# Patient Record
Sex: Male | Born: 1950 | Race: Black or African American | Hispanic: No | Marital: Married | Smoking: Never smoker
Health system: Southern US, Community
[De-identification: ages and names within clinical notes are randomized; demographics above are authoritative.]

## PROBLEM LIST (undated history)

## (undated) ENCOUNTER — Emergency Department (HOSPITAL_COMMUNITY): Admission: EM | Payer: PPO | Source: Home / Self Care

## (undated) DIAGNOSIS — M25469 Effusion, unspecified knee: Secondary | ICD-10-CM

## (undated) DIAGNOSIS — M1990 Unspecified osteoarthritis, unspecified site: Secondary | ICD-10-CM

## (undated) DIAGNOSIS — I480 Paroxysmal atrial fibrillation: Secondary | ICD-10-CM

## (undated) DIAGNOSIS — I4891 Unspecified atrial fibrillation: Secondary | ICD-10-CM

## (undated) DIAGNOSIS — R112 Nausea with vomiting, unspecified: Secondary | ICD-10-CM

## (undated) DIAGNOSIS — Z9889 Other specified postprocedural states: Secondary | ICD-10-CM

## (undated) DIAGNOSIS — M549 Dorsalgia, unspecified: Secondary | ICD-10-CM

## (undated) DIAGNOSIS — N200 Calculus of kidney: Secondary | ICD-10-CM

## (undated) DIAGNOSIS — G4730 Sleep apnea, unspecified: Secondary | ICD-10-CM

## (undated) HISTORY — DX: Unspecified atrial fibrillation: I48.91

## (undated) HISTORY — PX: LITHOTRIPSY: SUR834

## (undated) HISTORY — DX: Paroxysmal atrial fibrillation: I48.0

## (undated) HISTORY — DX: Sleep apnea, unspecified: G47.30

## (undated) HISTORY — DX: Dorsalgia, unspecified: M54.9

## (undated) HISTORY — DX: Effusion, unspecified knee: M25.469

## (undated) HISTORY — PX: BACK SURGERY: SHX140

## (undated) SURGERY — Surgical Case
Anesthesia: *Unknown

---

## 2001-01-23 ENCOUNTER — Encounter: Payer: Self-pay | Admitting: Family Medicine

## 2001-01-23 ENCOUNTER — Ambulatory Visit (HOSPITAL_COMMUNITY): Admission: RE | Admit: 2001-01-23 | Discharge: 2001-01-23 | Payer: Self-pay | Admitting: Family Medicine

## 2001-01-30 ENCOUNTER — Encounter: Payer: Self-pay | Admitting: Family Medicine

## 2001-01-30 ENCOUNTER — Ambulatory Visit (HOSPITAL_COMMUNITY): Admission: RE | Admit: 2001-01-30 | Discharge: 2001-01-30 | Payer: Self-pay | Admitting: Family Medicine

## 2001-02-08 ENCOUNTER — Ambulatory Visit (HOSPITAL_COMMUNITY): Admission: RE | Admit: 2001-02-08 | Discharge: 2001-02-08 | Payer: Self-pay | Admitting: General Surgery

## 2001-07-26 ENCOUNTER — Encounter: Payer: Self-pay | Admitting: Family Medicine

## 2001-07-26 ENCOUNTER — Ambulatory Visit (HOSPITAL_COMMUNITY): Admission: RE | Admit: 2001-07-26 | Discharge: 2001-07-26 | Payer: Self-pay | Admitting: Family Medicine

## 2002-02-08 ENCOUNTER — Encounter: Payer: Self-pay | Admitting: Family Medicine

## 2002-02-08 ENCOUNTER — Ambulatory Visit (HOSPITAL_COMMUNITY): Admission: RE | Admit: 2002-02-08 | Discharge: 2002-02-08 | Payer: Self-pay | Admitting: Family Medicine

## 2002-02-28 ENCOUNTER — Encounter (HOSPITAL_COMMUNITY): Admission: RE | Admit: 2002-02-28 | Discharge: 2002-03-30 | Payer: Self-pay | Admitting: Neurosurgery

## 2003-03-27 ENCOUNTER — Other Ambulatory Visit: Admission: RE | Admit: 2003-03-27 | Discharge: 2003-03-27 | Payer: Self-pay | Admitting: Family Medicine

## 2003-10-07 ENCOUNTER — Ambulatory Visit (HOSPITAL_COMMUNITY): Admission: RE | Admit: 2003-10-07 | Discharge: 2003-10-07 | Payer: Self-pay | Admitting: Family Medicine

## 2007-10-26 ENCOUNTER — Emergency Department (HOSPITAL_COMMUNITY): Admission: EM | Admit: 2007-10-26 | Discharge: 2007-10-26 | Payer: Self-pay | Admitting: Emergency Medicine

## 2011-03-18 ENCOUNTER — Ambulatory Visit (HOSPITAL_COMMUNITY): Admission: RE | Admit: 2011-03-18 | Payer: Self-pay | Source: Ambulatory Visit | Admitting: Neurosurgery

## 2011-04-12 LAB — POCT I-STAT, CHEM 8
BUN: 12
Calcium, Ion: 1.2
Creatinine, Ser: 1.2
Glucose, Bld: 111 — ABNORMAL HIGH
Hemoglobin: 15.6
Sodium: 141
TCO2: 25

## 2011-04-12 LAB — CBC
HCT: 43.9
Hemoglobin: 15.2
MCHC: 34.6
MCV: 93.2
Platelets: 258
RBC: 4.71
RDW: 14.1
WBC: 7.8

## 2011-04-12 LAB — DIFFERENTIAL
Eosinophils Absolute: 0.1
Eosinophils Relative: 1
Lymphocytes Relative: 28
Lymphs Abs: 2.2
Monocytes Absolute: 0.6
Monocytes Relative: 8

## 2012-02-27 ENCOUNTER — Encounter (HOSPITAL_COMMUNITY): Payer: Self-pay

## 2012-02-27 ENCOUNTER — Emergency Department (HOSPITAL_COMMUNITY)
Admission: EM | Admit: 2012-02-27 | Discharge: 2012-02-27 | Disposition: A | Discharge status: Home / Self Care | Payer: 59 | Source: Home / Self Care | Attending: Emergency Medicine | Admitting: Emergency Medicine

## 2012-02-27 DIAGNOSIS — R319 Hematuria, unspecified: Secondary | ICD-10-CM

## 2012-02-27 LAB — POCT URINALYSIS DIP (DEVICE)
Leukocytes, UA: NEGATIVE
Nitrite: NEGATIVE
Protein, ur: 30 mg/dL — AB
Urobilinogen, UA: 1 mg/dL (ref 0.0–1.0)
pH: 6.5 (ref 5.0–8.0)

## 2012-02-27 LAB — POCT I-STAT, CHEM 8
BUN: 11 mg/dL (ref 6–23)
Creatinine, Ser: 1.1 mg/dL (ref 0.50–1.35)
Hemoglobin: 14.6 g/dL (ref 13.0–17.0)
Potassium: 4 mEq/L (ref 3.5–5.1)
Sodium: 142 mEq/L (ref 135–145)
TCO2: 26 mmol/L (ref 0–100)

## 2012-02-27 LAB — OCCULT BLOOD, POC DEVICE: Fecal Occult Bld: NEGATIVE

## 2012-02-27 MED ORDER — CIPROFLOXACIN HCL 500 MG PO TABS: 500.0000 mg | ORAL_TABLET | Freq: Two times a day (BID) | ORAL | Status: AC

## 2012-02-27 NOTE — ED Provider Notes (Signed)
Chief Complaint  Patient presents with  . Male GU Problem    History of Present Illness:   [name] is a 61 year old male who had a three-day history of brownish discoloration of his urine. He denies any pain in in the back, lower abdomen, dysuria, frequency, urgency, or urethral discharge. He's had no prior history of hematuria. He denies any history of kidney stones or kidney infections. He denies any history of prostate infections or prostate problems. He has had a PSA done which was normal.   Review of Systems:  Other than noted above, the patient denies any of the following symptoms: General:  No fevers, chills, sweats, aches, or fatigue. GI:  No abdominal pain, back pain, nausea, vomiting, diarrhea, or constipation. GU:  No dysuria, frequency, urgency, hematuria, urethral discharge, penile lesions, penile pain, testicular pain, swelling, or mass, inguinal lymphadenopathy or incontinence.  PMFSH:  Past medical history, family history, social history, meds, and allergies were reviewed.  Physical Exam:   Vital signs:  BP 143/81  Pulse 66  Temp 98.3 F (36.8 C) (Oral)  Resp 18  SpO2 94% Gen:  Alert, oriented, in no distress. Lungs:  Clear to auscultation, no wheezes, rales or rhonchi. Heart:  Regular rhythm, no gallop or murmer. Abdomen:  Flat and soft.  No tenderness to palpation, guarding, or rebound.  No hepato-splenomegaly or mass.  Bowel sounds were normally active.  No hernia. Genital exam:   No lesions on the penis, no urethral discharge, testes were normal and nontender without any masses. Rectal exam:   Digital rectal exam reveals no masses, normal prostate, no tenderness, and heme-negative stool.  Back:  No CVA tenderness.  Skin:  Clear, warm and dry.  Labs:   Results for orders placed during the hospital encounter of 02/27/12  POCT URINALYSIS DIP (DEVICE)      Component Value Range   Glucose, UA NEGATIVE  NEGATIVE mg/dL   Bilirubin Urine NEGATIVE  NEGATIVE   Ketones, ur NEGATIVE  NEGATIVE mg/dL   Specific Gravity, Urine 1.025  1.005 - 1.030   Hgb urine dipstick LARGE (*) NEGATIVE   pH 6.5  5.0 - 8.0   Protein, ur 30 (*) NEGATIVE mg/dL   Urobilinogen, UA 1.0  0.0 - 1.0 mg/dL   Nitrite NEGATIVE  NEGATIVE   Leukocytes, UA NEGATIVE  NEGATIVE  POCT I-STAT, CHEM 8      Component Value Range   Sodium 142  135 - 145 mEq/L   Potassium 4.0  3.5 - 5.1 mEq/L   Chloride 105  96 - 112 mEq/L   BUN 11  6 - 23 mg/dL   Creatinine, Ser 1.61  0.50 - 1.35 mg/dL   Glucose, Bld 91  70 - 99 mg/dL   Calcium, Ion 0.96  0.45 - 1.30 mmol/L   TCO2 26  0 - 100 mmol/L   Hemoglobin 14.6  13.0 - 17.0 g/dL   HCT 40.9  81.1 - 91.4 %  OCCULT BLOOD, POC DEVICE      Component Value Range   Fecal Occult Bld NEGATIVE      Other Labs Obtained at [facility]:  Urine culture was obtained.  Results are pending at this time and we will call about any positive results.  Assessment: The encounter diagnosis was Hematuria.  Possibly due to infection, stone, or tumor.   Plan:   1.  The following meds were prescribed:   New Prescriptions   CIPROFLOXACIN (CIPRO) 500 MG TABLET    Take 1  tablet (500 mg total) by mouth every 12 (twelve) hours.   2.  The patient was instructed in symptomatic care and handouts were given. 3.  The patient was told to return if becoming worse in any way, if no better in 3 or 4 days, and given some red flag symptoms that would indicate earlier return.  Follow up:  The patient was told to follow up with Dr. Lorin Picket MacDiarmid in one week.      Reuben Likes, MD 02/27/12 2059

## 2012-02-27 NOTE — ED Notes (Signed)
States his UA has been dark for past few days, has been increasing his Po fluid intake in an attempt to treat at home; States he has pain in back residual from back surgery that not changed

## 2012-02-28 LAB — URINE CULTURE

## 2012-04-02 ENCOUNTER — Other Ambulatory Visit: Payer: Self-pay | Admitting: Urology

## 2012-04-10 ENCOUNTER — Encounter (HOSPITAL_COMMUNITY): Payer: Self-pay | Admitting: Pharmacy Technician

## 2012-04-19 ENCOUNTER — Encounter (HOSPITAL_COMMUNITY): Payer: Self-pay | Admitting: *Deleted

## 2012-04-19 NOTE — Pre-Procedure Instructions (Signed)
Asked to bring blue folder the day of the procedure,insurance card,I.D. driver's license,wear comfortable clothing and have a driver for the day. Asked not to take Advil,Motrin,Ibuprofen,Aleve or any NSAIDS, Aspirin, or Toradol for 72 hours prior to procedure,  No vitamins or herbal medications 7 days prior to procedure. Instructed to take laxative per doctor's office instructions and eat a light dinner the evening before procedure.   To arrive at 1030 for lithotripsy procedure.  

## 2012-04-23 ENCOUNTER — Emergency Department (HOSPITAL_COMMUNITY)
Admission: EM | Admit: 2012-04-23 | Discharge: 2012-04-24 | Disposition: A | Discharge status: Home / Self Care | Payer: 59 | Attending: Emergency Medicine | Admitting: Emergency Medicine

## 2012-04-23 ENCOUNTER — Encounter (HOSPITAL_COMMUNITY)
Admission: RE | Disposition: A | Discharge status: Home / Self Care | Payer: Self-pay | Source: Ambulatory Visit | Attending: Urology

## 2012-04-23 ENCOUNTER — Encounter (HOSPITAL_COMMUNITY): Payer: Self-pay | Admitting: *Deleted

## 2012-04-23 ENCOUNTER — Ambulatory Visit (HOSPITAL_COMMUNITY): Payer: 59

## 2012-04-23 ENCOUNTER — Ambulatory Visit (HOSPITAL_COMMUNITY)
Admission: RE | Admit: 2012-04-23 | Discharge: 2012-04-23 | Disposition: A | Discharge status: Home / Self Care | Payer: 59 | Source: Ambulatory Visit | Attending: Urology | Admitting: Urology

## 2012-04-23 DIAGNOSIS — N189 Chronic kidney disease, unspecified: Secondary | ICD-10-CM | POA: Insufficient documentation

## 2012-04-23 DIAGNOSIS — N390 Urinary tract infection, site not specified: Secondary | ICD-10-CM | POA: Insufficient documentation

## 2012-04-23 DIAGNOSIS — N200 Calculus of kidney: Secondary | ICD-10-CM | POA: Insufficient documentation

## 2012-04-23 DIAGNOSIS — R112 Nausea with vomiting, unspecified: Secondary | ICD-10-CM

## 2012-04-23 LAB — CBC WITH DIFFERENTIAL/PLATELET
Basophils Absolute: 0 10*3/uL (ref 0.0–0.1)
HCT: 41.3 % (ref 39.0–52.0)
Hemoglobin: 14.7 g/dL (ref 13.0–17.0)
Lymphocytes Relative: 9 % — ABNORMAL LOW (ref 12–46)
Monocytes Absolute: 0.6 10*3/uL (ref 0.1–1.0)
Monocytes Relative: 5 % (ref 3–12)
Neutro Abs: 10.6 10*3/uL — ABNORMAL HIGH (ref 1.7–7.7)
WBC: 12.3 10*3/uL — ABNORMAL HIGH (ref 4.0–10.5)

## 2012-04-23 SURGERY — LITHOTRIPSY, ESWL
Anesthesia: LOCAL | Laterality: Left

## 2012-04-23 MED ORDER — ONDANSETRON HCL 4 MG/2ML IJ SOLN
4.0000 mg | Freq: Once | INTRAMUSCULAR | Status: AC
  Administered 2012-04-24: 4 mg via INTRAVENOUS
  Filled 2012-04-23: qty 2

## 2012-04-23 MED ORDER — CIPROFLOXACIN IN D5W 400 MG/200ML IV SOLN
400.0000 mg | INTRAVENOUS | Status: AC
  Administered 2012-04-23: 400 mg via INTRAVENOUS
  Filled 2012-04-23: qty 200

## 2012-04-23 MED ORDER — ONDANSETRON 4 MG PO TBDP
8.0000 mg | ORAL_TABLET | Freq: Once | ORAL | Status: AC
  Administered 2012-04-23: 8 mg via ORAL
  Filled 2012-04-23: qty 2

## 2012-04-23 MED ORDER — MORPHINE SULFATE 4 MG/ML IJ SOLN
4.0000 mg | Freq: Once | INTRAMUSCULAR | Status: AC
  Administered 2012-04-24: 4 mg via INTRAVENOUS
  Filled 2012-04-23: qty 1

## 2012-04-23 MED ORDER — DEXTROSE-NACL 5-0.45 % IV SOLN
INTRAVENOUS | Status: DC
  Administered 2012-04-23: 11:00:00 via INTRAVENOUS

## 2012-04-23 MED ORDER — DIPHENHYDRAMINE HCL 25 MG PO CAPS
25.0000 mg | ORAL_CAPSULE | ORAL | Status: AC
  Administered 2012-04-23: 25 mg via ORAL
  Filled 2012-04-23: qty 1

## 2012-04-23 MED ORDER — DIAZEPAM 5 MG PO TABS
10.0000 mg | ORAL_TABLET | ORAL | Status: AC
  Administered 2012-04-23: 10 mg via ORAL
  Filled 2012-04-23: qty 2

## 2012-04-23 MED ORDER — HYDROCODONE-ACETAMINOPHEN 5-325 MG PO TABS
1.0000 | ORAL_TABLET | Freq: Four times a day (QID) | ORAL | Status: DC | PRN
  Administered 2012-04-23: 1 via ORAL
  Filled 2012-04-23: qty 1

## 2012-04-23 MED ORDER — ONDANSETRON HCL 4 MG/2ML IJ SOLN
4.0000 mg | Freq: Once | INTRAMUSCULAR | Status: AC
  Administered 2012-04-23: 4 mg via INTRAVENOUS
  Filled 2012-04-23: qty 2

## 2012-04-23 NOTE — Op Note (Signed)
See [facility] OP note scanned into chart. 

## 2012-04-23 NOTE — ED Notes (Signed)
The pt had lithotripsy earlier today tonighthis pain has increased as the  Elenora Fender has been going through.  nauseated

## 2012-04-23 NOTE — ED Provider Notes (Signed)
History     CSN: [id number]  Arrival date & time 04/23/12  2237   First MD Initiated Contact with Patient 04/23/12 2324      Chief Complaint  Patient presents with  . Abdominal Pain    (Consider location/radiation/quality/duration/timing/severity/associated sxs/prior treatment) HPI  61 y.o. male  appears acutely uncomfortable complaining of pain and nausea/vomitting status post lithotripsy by Dr. Isabel Caprice earlier today. Denies fever, chest pain, shortness of breath, change in bowel habits. Patient is passing more Gravelly material through the urine. Is conscious during procedure.  Past Medical History  Diagnosis Date  . Chronic kidney disease     kidney stone    Past Surgical History  Procedure Date  . Back surgery     No family history on file.  History  Substance Use Topics  . Smoking status: Never Smoker   . Smokeless tobacco: Never Used  . Alcohol Use: No      Review of Systems  Constitutional: Negative for fever.  Respiratory: Negative for shortness of breath.   Cardiovascular: Negative for chest pain.  Gastrointestinal: Positive for nausea. Negative for vomiting, abdominal pain and diarrhea.  Genitourinary: Positive for flank pain.  All other systems reviewed and are negative.    Allergies  Review of patient's allergies indicates no known allergies.  Home Medications   Current Outpatient Rx  Name Route Sig Dispense Refill  . CELECOXIB 200 MG PO CAPS Oral Take 200 mg by mouth daily.    Marland Kitchen HYDROCODONE-ACETAMINOPHEN 5-325 MG PO TABS Oral Take 1 tablet by mouth every 6 (six) hours as needed. Pain      BP 147/81  Pulse 61  Temp 98.6 F (37 C) (Oral)  Resp 18  SpO2 95%  Physical Exam  Nursing note and vitals reviewed. Constitutional: He is oriented to person, place, and time. He appears well-developed and well-nourished.       Appears uncomfortable  HENT:  Head: Normocephalic and atraumatic.  Mouth/Throat: Oropharynx is clear and moist.  Eyes:  Conjunctivae normal and EOM are normal.  Neck: Normal range of motion.  Cardiovascular: Normal rate, regular rhythm, normal heart sounds and intact distal pulses.   Pulmonary/Chest: Effort normal and breath sounds normal. No stridor. No respiratory distress. He has no wheezes. He has no rales. He exhibits no tenderness.  Abdominal: Soft. Bowel sounds are normal.  Genitourinary:       Ecchymoses to left flank area  Musculoskeletal: Normal range of motion.  Neurological: He is alert and oriented to person, place, and time.  Skin: Skin is warm.  Psychiatric: He has a normal mood and affect.    ED Course  Procedures (including critical care time)  Labs Reviewed  CBC WITH DIFFERENTIAL - Abnormal; Notable for the following:    WBC 12.3 (*)     Neutrophils Relative 86 (*)     Neutro Abs 10.6 (*)     Lymphocytes Relative 9 (*)     All other components within normal limits  COMPREHENSIVE METABOLIC PANEL - Abnormal; Notable for the following:    Glucose, Bld 142 (*)     Total Bilirubin 1.4 (*)     All other components within normal limits  URINALYSIS, ROUTINE W REFLEX MICROSCOPIC   Dg Abd 1 View  04/23/2012  *RADIOLOGY REPORT*  Clinical Data: Prelithotripsy  ABDOMEN - 1 VIEW  Comparison: CT abdomen pelvis dated 03/27/2012  Findings: 11 mm calculus overlying the left renal pelvis.  Otherwise unremarkable abdominal radiograph.  IMPRESSION: 11 mm calculus overlying  the left renal pelvis.   Original Report Authenticated By: Charline Bills, M.D.     Date: 04/24/2012  Rate: 65  Rhythm: normal sinus rhythm  QRS Axis: normal  Intervals: normal  ST/T Wave abnormalities: normal  Conduction Disutrbances:none  Narrative Interpretation:   Old EKG Reviewed: none available  1. Renal calculus   2. Nausea & vomiting   3. UTI (urinary tract infection)       MDM  61 y.o. male with nausea status post conscious lithotripsy earlier in the day. Patient's symptoms are effectively controlled with IV  Zofran pain he reports is minimal he is really disturbed by the nausea more than anything. Patient will be discharged pending UA, to evaluate for UTI.   Case is signed out to Dr. Silverio Lay at shift change.  New Prescriptions   CIPROFLOXACIN (CIPRO) 500 MG TABLET    Take 1 tablet (500 mg total) by mouth 2 (two) times daily.   ONDANSETRON (ZOFRAN ODT) 4 MG DISINTEGRATING TABLET    4mg  ODT q4 hours prn nausea/vomit   PROMETHAZINE (PHENERGAN) 25 MG SUPPOSITORY    Place 1 suppository (25 mg total) rectally every 6 (six) hours as needed for nausea.          Wynetta Emery, PA-C 04/26/12 [phone]

## 2012-04-23 NOTE — H&P (Signed)
Reason For Visit     [name] returns to discuss his recent CT findings. He had asymptomatic gross hematuria. CT scan showed a 6x10 mm stone in the left renal pelvis without significant obstruction and a nonobstructing 2 mm stone in the lower pole. Urine cytology was negative.  Still with TNTC rbcs today.   History of Present Illness      [name] is currently 61 years of age and without prior urologic history.  Several weeks ago he began noticing a dark tea-colored urine.  He has had no change in voiding symptoms.  No abdominal or flank pain.  [facility] where he was documented to have significant microhematuria.  Renal function was normal with a creatinine of 1.0.  The patient was given some empiric antibiotics but urine culture was negative.  He has continued to have dark colored urine.  Urine today shows significant microhematuria without pyuria or bacteruria.  He reports normal PSA testing.  He is a nonsmoker.  No personal or family history of  nephrolithiasis.     Past Medical History Problems  1. History of  Arthritis V13.4  Surgical History Problems  1. History of  Back Surgery  Current Meds 1. CeleBREX 200 MG Oral Capsule; Therapy: (Recorded:06Sep2013) to 2. Ciprofloxacin HCl 500 MG Oral Tablet; Therapy: (Recorded:06Sep2013) to 3. Hydrocodone-Acetaminophen 5-325 MG Oral Tablet; Therapy: (Recorded:06Sep2013) to  Allergies Medication  1. No Known Drug Allergies  Family History Problems  1. Paternal history of  Alcohol Abuse 2. Paternal history of  Death In The Family Father 94yrs 3. Maternal history of  Family Health Status - Mother's Age 82yrs 4. Family history of  Family Health Status Number Of Children 1 son and 1 daughter  Social History Problems  1. Caffeine Use 2 qd 2. Marital History - Currently Married 3. Never A Smoker 4. Occupation: truck Psychologist, occupational  5. History of  Alcohol Use 6. History of  Tobacco Use  Review of  Systems Genitourinary, constitutional, skin, eye, otolaryngeal, hematologic/lymphatic, cardiovascular, pulmonary, endocrine, musculoskeletal, gastrointestinal, neurological and psychiatric system(s) were reviewed and pertinent findings if present are noted.  Genitourinary: nocturia and hematuria.  Gastrointestinal: constipation.  Musculoskeletal: back pain.    Vitals Vital Signs [Data Includes: Last 1 Day]  16Sep2013 02:14PM  Blood Pressure: 128 / 85 Temperature: 97.9 F Heart Rate: 66  Physical Exam Constitutional: Well nourished and well developed . No acute distress.  ENT:. The ears and nose are normal in appearance.  Neck: The appearance of the neck is normal and no neck mass is present.  Pulmonary: No respiratory distress and normal respiratory rhythm and effort.  Cardiovascular: Heart rate and rhythm are normal . No peripheral edema.  Abdomen: The abdomen is soft and nontender. No masses are palpated. No CVA tenderness. No hernias are palpable. No hepatosplenomegaly noted.  Rectal: Rectal exam demonstrates normal sphincter tone, no tenderness and no masses. The prostate has no nodularity and is not tender. The left seminal vesicle is nonpalpable. The right seminal vesicle is nonpalpable. The perineum is normal on inspection.  Genitourinary: Examination of the penis demonstrates no discharge, no masses, no lesions and a normal meatus. The scrotum is without lesions. The right epididymis is palpably normal and non-tender. The left epididymis is palpably normal and non-tender. The right testis is non-tender and without masses. The left testis is non-tender and without masses.  Lymphatics: The femoral and inguinal nodes are not enlarged or tender.  Skin: Normal skin turgor, no  visible rash and no visible skin lesions.  Neuro/Psych:. Mood and affect are appropriate.      Results/Data Urine [Data Includes: Last 1 Day]   16Sep2013 COLOR YELLOW  APPEARANCE CLEAR  SPECIFIC GRAVITY 1.025    pH 6.0  GLUCOSE NEG mg/dL BILIRUBIN NEG  KETONE NEG mg/dL BLOOD LARGE  PROTEIN NEG mg/dL UROBILINOGEN 0.2 mg/dL NITRITE NEG  LEUKOCYTE ESTERASE NEG  SQUAMOUS EPITHELIAL/HPF NONE SEEN  WBC NONE SEEN WBC/hpf RBC TNTC RBC/hpf BACTERIA RARE  CRYSTALS NONE SEEN  CASTS NONE SEEN   Assessment Assessed  1. Nephrolithiasis 592.0 2. Gross Hematuria 599.71  Plan Gross Hematuria (599.71)  1. Follow-up Schedule Surgery Office  Follow-up  Done: 16Sep2013 Health Maintenance (V70.0)  2. UA With REFLEX  Done: 16Sep2013 02:06PM  Discussion/Summary  [name] gross hematuria appears to have been secondary to a 6x 10 mm stone in his left renal pelvis. It is currently not causing significant discomfort or obstruction. Given the size and location of the stone I would recommend ESWL as the most prudent therapy. We did discuss that treatment in detail. We discussed success rates and potential complications. He accepts this recommendation.     Amendment  cc Clovis Riley, MD   Signatures Electronically signed by : Barron Alvine, M.D.; Apr 03 2012 10:01AM

## 2012-04-24 LAB — URINALYSIS, ROUTINE W REFLEX MICROSCOPIC
Ketones, ur: 40 mg/dL — AB
Protein, ur: 100 mg/dL — AB
Urobilinogen, UA: 0.2 mg/dL (ref 0.0–1.0)

## 2012-04-24 LAB — COMPREHENSIVE METABOLIC PANEL
AST: 20 U/L (ref 0–37)
Alkaline Phosphatase: 59 U/L (ref 39–117)
BUN: 8 mg/dL (ref 6–23)
CO2: 26 mEq/L (ref 19–32)
Chloride: 104 mEq/L (ref 96–112)
Creatinine, Ser: 0.87 mg/dL (ref 0.50–1.35)
GFR calc non Af Amer: >90 mL/min (ref >90)
Total Bilirubin: 1.4 mg/dL — ABNORMAL HIGH (ref 0.3–1.2)

## 2012-04-24 LAB — URINE MICROSCOPIC-ADD ON

## 2012-04-24 MED ORDER — ONDANSETRON HCL 4 MG/2ML IJ SOLN: 4.0000 mg | Freq: Once | INTRAMUSCULAR | Status: DC

## 2012-04-24 MED ORDER — CIPROFLOXACIN HCL 500 MG PO TABS: 500.0000 mg | ORAL_TABLET | Freq: Two times a day (BID) | ORAL | Status: DC

## 2012-04-24 MED ORDER — ONDANSETRON 4 MG PO TBDP: ORAL_TABLET | ORAL | Status: DC

## 2012-04-24 MED ORDER — PROMETHAZINE HCL 25 MG RE SUPP: 25.0000 mg | Freq: Four times a day (QID) | RECTAL | Status: DC | PRN

## 2012-04-24 MED ORDER — CIPROFLOXACIN HCL 500 MG PO TABS
500.0000 mg | ORAL_TABLET | Freq: Once | ORAL | Status: AC
  Administered 2012-04-24: 500 mg via ORAL
  Filled 2012-04-24: qty 1

## 2012-04-24 NOTE — ED Notes (Signed)
Labs drawn Left hand with iv site labeled and sent to lab

## 2012-04-28 NOTE — ED Provider Notes (Signed)
Medical screening examination/treatment/procedure(s) were performed by non-physician practitioner and as supervising physician I was immediately available for consultation/collaboration.  UA showed blood, + UTI. He was discharged on cipro.    Richardean Canal, MD 04/28/12 [phone]

## 2012-06-11 ENCOUNTER — Encounter (HOSPITAL_COMMUNITY): Payer: Self-pay | Admitting: *Deleted

## 2012-06-11 ENCOUNTER — Emergency Department (HOSPITAL_COMMUNITY)
Admission: EM | Admit: 2012-06-11 | Discharge: 2012-06-11 | Disposition: A | Discharge status: Home / Self Care | Payer: 59 | Attending: Emergency Medicine | Admitting: Emergency Medicine

## 2012-06-11 ENCOUNTER — Emergency Department (HOSPITAL_COMMUNITY): Payer: 59

## 2012-06-11 DIAGNOSIS — Z79899 Other long term (current) drug therapy: Secondary | ICD-10-CM | POA: Insufficient documentation

## 2012-06-11 DIAGNOSIS — N189 Chronic kidney disease, unspecified: Secondary | ICD-10-CM | POA: Insufficient documentation

## 2012-06-11 DIAGNOSIS — N200 Calculus of kidney: Secondary | ICD-10-CM | POA: Insufficient documentation

## 2012-06-11 DIAGNOSIS — R112 Nausea with vomiting, unspecified: Secondary | ICD-10-CM | POA: Insufficient documentation

## 2012-06-11 LAB — URINALYSIS, ROUTINE W REFLEX MICROSCOPIC
Bilirubin Urine: NEGATIVE
Leukocytes, UA: NEGATIVE
Nitrite: NEGATIVE
Specific Gravity, Urine: 1.033 — ABNORMAL HIGH (ref 1.005–1.030)
Urobilinogen, UA: 0.2 mg/dL (ref 0.0–1.0)
pH: 5.5 (ref 5.0–8.0)

## 2012-06-11 LAB — URINE MICROSCOPIC-ADD ON

## 2012-06-11 MED ORDER — ONDANSETRON HCL 4 MG/2ML IJ SOLN
4.0000 mg | Freq: Once | INTRAMUSCULAR | Status: AC
  Administered 2012-06-11: 4 mg via INTRAVENOUS
  Filled 2012-06-11: qty 2

## 2012-06-11 MED ORDER — MORPHINE SULFATE 4 MG/ML IJ SOLN: 2.0000 mg | Freq: Once | INTRAMUSCULAR | Status: DC

## 2012-06-11 MED ORDER — SODIUM CHLORIDE 0.9 % IV BOLUS (SEPSIS)
1000.0000 mL | Freq: Once | INTRAVENOUS | Status: AC
  Administered 2012-06-11: 1000 mL via INTRAVENOUS

## 2012-06-11 MED ORDER — ONDANSETRON 4 MG PO TBDP: 4.0000 mg | ORAL_TABLET | Freq: Three times a day (TID) | ORAL | Status: DC | PRN

## 2012-06-11 MED ORDER — OXYCODONE-ACETAMINOPHEN 5-325 MG PO TABS: 2.0000 | ORAL_TABLET | ORAL | Status: DC | PRN

## 2012-06-11 MED ORDER — ONDANSETRON HCL 4 MG/2ML IJ SOLN
4.0000 mg | Freq: Once | INTRAMUSCULAR | Status: DC
  Filled 2012-06-11: qty 2

## 2012-06-11 NOTE — ED Provider Notes (Signed)
History     CSN: [id number]  Arrival date & time 06/11/12  2130   First MD Initiated Contact with Patient 06/11/12 [phone]      Chief Complaint  Patient presents with  . Abdominal Pain    (Consider location/radiation/quality/duration/timing/severity/associated sxs/prior treatment) HPI Comments: Patient is a 61 year old male with a past medical history of nephrolithiasis who presents with left flank pain. The pain is located in his left flank and radiates around to his left upper abdominal quadrant. The pain is described as sharp and severe. The pain started suddenly and progressively worsened since the onset. The pain is intermittent and starts without known trigger. No alleviating/aggravating factors. The patient has tried nothing for symptoms without relief. Associated symptoms include nausea and vomiting. Patient denies fever, headache, NVD, chest pain, SOB, dysuria, constipation, hematuria.     Past Medical History  Diagnosis Date  . Chronic kidney disease     kidney stone    Past Surgical History  Procedure Date  . Back surgery     No family history on file.  History  Substance Use Topics  . Smoking status: Never Smoker   . Smokeless tobacco: Never Used  . Alcohol Use: No      Review of Systems  Gastrointestinal: Positive for nausea and vomiting.  Genitourinary: Positive for flank pain.  All other systems reviewed and are negative.    Allergies  Review of patient's allergies indicates no known allergies.  Home Medications   Current Outpatient Rx  Name  Route  Sig  Dispense  Refill  . CELECOXIB 200 MG PO CAPS   Oral   Take 200 mg by mouth daily.         Marland Kitchen CIPROFLOXACIN HCL 500 MG PO TABS   Oral   Take 1 tablet (500 mg total) by mouth 2 (two) times daily.   14 tablet   0   . HYDROCODONE-ACETAMINOPHEN 5-325 MG PO TABS   Oral   Take 1 tablet by mouth every 6 (six) hours as needed. Pain         . ONDANSETRON 4 MG PO TBDP      4mg  ODT q4 hours  prn nausea/vomit   4 tablet   0   . PROMETHAZINE HCL 25 MG RE SUPP   Rectal   Place 1 suppository (25 mg total) rectally every 6 (six) hours as needed for nausea.   12 each   0     BP 132/83  Temp 97.7 F (36.5 C) (Oral)  Resp 20  SpO2 99%  Physical Exam  Nursing note and vitals reviewed. Constitutional: He is oriented to person, place, and time. He appears well-developed and well-nourished. No distress.  HENT:  Head: Normocephalic and atraumatic.  Mouth/Throat: No oropharyngeal exudate.  Eyes: Conjunctivae normal are normal.  Neck: Normal range of motion. Neck supple.  Cardiovascular: Normal rate and regular rhythm.  Exam reveals no gallop and no friction rub.   No murmur heard. Pulmonary/Chest: Effort normal and breath sounds normal. He has no wheezes. He has no rales. He exhibits no tenderness.  Abdominal: Soft. He exhibits no distension. There is no tenderness. There is no rebound and no guarding.  Genitourinary:       Left CVA tenderness.   Musculoskeletal: Normal range of motion.  Neurological: He is alert and oriented to person, place, and time. Coordination normal.       Speech is goal-oriented. Moves limbs without ataxia.   Skin: Skin is warm and  dry. He is not diaphoretic.  Psychiatric: He has a normal mood and affect. His behavior is normal.    ED Course  Procedures (including critical care time)  Labs Reviewed - No data to display Ct Abdomen Pelvis Wo Contrast  06/11/2012  *RADIOLOGY REPORT*  Clinical Data: Left flank pain, history of renal stone  CT ABDOMEN AND PELVIS WITHOUT CONTRAST  Technique:  Multidetector CT imaging of the abdomen and pelvis was performed following the standard protocol without intravenous contrast.  Comparison: 03/27/2012  Findings: Linear scarring versus atelectasis in the bilateral lower lobes.  Unenhanced liver, spleen, pancreas, and left adrenal gland are within normal limits.  10 mm right adrenal adenoma (series 2/image 22).   Gallbladder is unremarkable.  No intrahepatic or extrahepatic ductal dilatation.  4.1 x 3.7 cm right lower pole renal cyst (series 2/image 34).  Five small left lower pole nonobstructing renal calculi measuring up to 4 mm (coronal image 84 and series 2/image 40).  No hydronephrosis.  No evidence of bowel obstruction.  Normal appendix.  Atherosclerotic calcifications of the abdominal aorta and branch vessels.  No abdominopelvic ascites.  No suspicious abdominopelvic lymphadenopathy.  Prostate is top normal in size.  4 mm distal left ureteral calculus at the UVJ (series 2/image 76). Bladder is unremarkable.  Degenerative changes of the visualized thoracolumbar spine.  IMPRESSION: 4 mm distal left ureteral calculus at the UVJ.  No hydronephrosis.  Additional nonobstructing left lower pole renal calculi measuring up to 4 mm.   Original Report Authenticated By: Charline Bills, M.D.      1. Nephrolithiasis       MDM  9:55 AM Patient will have CT scan to evaluate for nephrolithiasis. Patient will have morphine for pain and zofran for nausea.   11:34 AM Patient's CT shows 4mm stone in the ureter. Patient will have another liter of fluids and morphine for pain. Patient informed of results.       Emilia Beck, PA-C 06/18/12 2203

## 2012-06-11 NOTE — ED Notes (Signed)
Phillip Shannon, [place] notified about pts refusal for pain medication and zofran.

## 2012-06-11 NOTE — ED Notes (Signed)
Patient with left flank pain, radiating into abdomen, patient states intermittant pain, pain level 2/10 at this time

## 2012-06-11 NOTE — ED Notes (Signed)
Handed pt an urine strainer

## 2012-06-11 NOTE — ED Notes (Signed)
Pt denies pain; pt denies nausea; pt mentating appropriately.

## 2012-06-11 NOTE — ED Notes (Signed)
Patient states left flank pain starting this am at appox 0900, patient with history of kidney stones

## 2012-06-11 NOTE — ED Notes (Signed)
Pt ambulatory leaving ED with wife. Pt given d/c teaching and prescriptions. Pt refused pain and nausea medication stating is not in pain or feeling nauseous. Pt has no further questions upon d/c. Pt does not appear to be in any acute distress upon d/c.

## 2012-06-11 NOTE — ED Notes (Signed)
Pt given warm blanket.

## 2012-06-11 NOTE — ED Notes (Signed)
Pt denies pain; pt denies nausea; pt denies difficulty breathing. Pt mentating appropriately.

## 2012-06-20 NOTE — ED Provider Notes (Signed)
Medical screening examination/treatment/procedure(s) were performed by non-physician practitioner and as supervising physician I was immediately available for consultation/collaboration.   Laray Anger, DO 06/20/12 [phone]

## 2013-07-18 HISTORY — PX: EYE SURGERY: SHX253

## 2013-12-28 ENCOUNTER — Encounter (HOSPITAL_COMMUNITY): Payer: Self-pay | Admitting: Emergency Medicine

## 2013-12-28 ENCOUNTER — Emergency Department (HOSPITAL_COMMUNITY)
Admission: EM | Admit: 2013-12-28 | Discharge: 2013-12-29 | Disposition: A | Discharge status: Home / Self Care | Payer: 59 | Attending: Emergency Medicine | Admitting: Emergency Medicine

## 2013-12-28 ENCOUNTER — Emergency Department (HOSPITAL_COMMUNITY): Payer: 59

## 2013-12-28 DIAGNOSIS — N189 Chronic kidney disease, unspecified: Secondary | ICD-10-CM | POA: Insufficient documentation

## 2013-12-28 DIAGNOSIS — N23 Unspecified renal colic: Secondary | ICD-10-CM | POA: Insufficient documentation

## 2013-12-28 DIAGNOSIS — Z791 Long term (current) use of non-steroidal anti-inflammatories (NSAID): Secondary | ICD-10-CM | POA: Insufficient documentation

## 2013-12-28 DIAGNOSIS — N200 Calculus of kidney: Secondary | ICD-10-CM | POA: Insufficient documentation

## 2013-12-28 DIAGNOSIS — Z9889 Other specified postprocedural states: Secondary | ICD-10-CM | POA: Insufficient documentation

## 2013-12-28 HISTORY — DX: Calculus of kidney: N20.0

## 2013-12-28 LAB — CBC WITH DIFFERENTIAL/PLATELET
BASOS ABS: 0 10*3/uL (ref 0.0–0.1)
Basophils Relative: 0 % (ref 0–1)
EOS PCT: 3 % (ref 0–5)
Eosinophils Absolute: 0.2 10*3/uL (ref 0.0–0.7)
HEMATOCRIT: 42.1 % (ref 39.0–52.0)
Hemoglobin: 14.7 g/dL (ref 13.0–17.0)
LYMPHS ABS: 3 10*3/uL (ref 0.7–4.0)
LYMPHS PCT: 42 % (ref 12–46)
MCH: 31.7 pg (ref 26.0–34.0)
MCHC: 34.9 g/dL (ref 30.0–36.0)
MCV: 90.7 fL (ref 78.0–100.0)
MONO ABS: 0.6 10*3/uL (ref 0.1–1.0)
Monocytes Relative: 9 % (ref 3–12)
Neutro Abs: 3.3 10*3/uL (ref 1.7–7.7)
Neutrophils Relative %: 46 % (ref 43–77)
Platelets: 227 10*3/uL (ref 150–400)
RBC: 4.64 MIL/uL (ref 4.22–5.81)
RDW: 14.6 % (ref 11.5–15.5)
WBC: 7.1 10*3/uL (ref 4.0–10.5)

## 2013-12-28 LAB — COMPREHENSIVE METABOLIC PANEL
ALBUMIN: 4.1 g/dL (ref 3.5–5.2)
ALK PHOS: 57 U/L (ref 39–117)
ALT: 15 U/L (ref 0–53)
AST: 18 U/L (ref 0–37)
BUN: 14 mg/dL (ref 6–23)
CO2: 25 mEq/L (ref 19–32)
Calcium: 9.9 mg/dL (ref 8.4–10.5)
Chloride: 102 mEq/L (ref 96–112)
Creatinine, Ser: 1.05 mg/dL (ref 0.50–1.35)
GFR calc Af Amer: 85 mL/min — ABNORMAL LOW (ref >90)
GFR calc non Af Amer: 74 mL/min — ABNORMAL LOW (ref >90)
Glucose, Bld: 109 mg/dL — ABNORMAL HIGH (ref 70–99)
POTASSIUM: 3.8 meq/L (ref 3.7–5.3)
SODIUM: 143 meq/L (ref 137–147)
TOTAL PROTEIN: 7.8 g/dL (ref 6.0–8.3)
Total Bilirubin: 1.1 mg/dL (ref 0.3–1.2)

## 2013-12-28 LAB — URINALYSIS, ROUTINE W REFLEX MICROSCOPIC
BILIRUBIN URINE: NEGATIVE
Glucose, UA: NEGATIVE mg/dL
Ketones, ur: NEGATIVE mg/dL
LEUKOCYTES UA: NEGATIVE
NITRITE: NEGATIVE
Protein, ur: NEGATIVE mg/dL
SPECIFIC GRAVITY, URINE: 1.029 (ref 1.005–1.030)
UROBILINOGEN UA: 1 mg/dL (ref 0.0–1.0)
pH: 5.5 (ref 5.0–8.0)

## 2013-12-28 LAB — URINE MICROSCOPIC-ADD ON

## 2013-12-28 MED ORDER — ONDANSETRON HCL 4 MG/2ML IJ SOLN
4.0000 mg | Freq: Once | INTRAMUSCULAR | Status: AC
  Administered 2013-12-28: 4 mg via INTRAVENOUS
  Filled 2013-12-28: qty 2

## 2013-12-28 NOTE — ED Notes (Signed)
Pt arrives with c/o flank pain and nausea that started about 20 minutes ago, denies urinary symptoms. States he has hx of kidney stone. Wife states that he has a kidney stone that he has not passed.

## 2013-12-28 NOTE — ED Notes (Signed)
PT stated he's had R flank pain for ~4145mins that came out of nowhere. PT was 10/10 but intermittently reducing to 3/10. PT currently at 3/10 pain. PT is nauseous

## 2013-12-28 NOTE — ED Provider Notes (Signed)
CSN: [id number]     Arrival date & time 12/28/13  2203 History   First MD Initiated Contact with Patient 12/28/13 2302     Chief Complaint  Patient presents with  . Flank Pain  . Nausea     (Consider location/radiation/quality/duration/timing/severity/associated sxs/prior Treatment) Patient is a 63 y.o. male presenting with flank pain. The history is provided by the patient.  Flank Pain   He complains of right flank pain. That started about one hour prior to arrival. The pain resolved spontaneously after about 45 minutes. The pain is similar to pain, he had on the left with a kidney stone. He denies dysuria, urinary frequency, hematuria, fever, chills, cough, shortness of breath, or chest pain. He has not had anorexia. He ate well today. No other recent illnesses. There are no other known modifying factors.  Past Medical History  Diagnosis Date  . Chronic kidney disease     kidney stone  . Kidney stone    Past Surgical History  Procedure Laterality Date  . Back surgery    . Lithotripsy     No family history on file. History  Substance Use Topics  . Smoking status: Never Smoker   . Smokeless tobacco: Never Used  . Alcohol Use: No    Review of Systems  Genitourinary: Positive for flank pain.  All other systems reviewed and are negative.     Allergies  Review of patient's allergies indicates no known allergies.  Home Medications   Prior to Admission medications   Medication Sig Start Date End Date Taking? Authorizing Provider  naproxen sodium (ANAPROX) 220 MG tablet Take 220-440 mg by mouth daily as needed (for pain).   Yes Historical Provider, MD  oxyCODONE-acetaminophen (PERCOCET) 5-325 MG per tablet Take 1 tablet by mouth every 4 (four) hours as needed for severe pain. 12/29/13   Flint Melter, MD   BP 117/74  Pulse 63  Temp(Src) 98 F (36.7 C) (Oral)  Resp 15  SpO2 97% Physical Exam  Nursing note and vitals reviewed. Constitutional: He is oriented to  person, place, and time. He appears well-developed and well-nourished.  HENT:  Head: Normocephalic and atraumatic.  Right Ear: External ear normal.  Left Ear: External ear normal.  Eyes: Conjunctivae and EOM are normal. Pupils are equal, round, and reactive to light.  Neck: Normal range of motion and phonation normal. Neck supple.  Cardiovascular: Normal rate, regular rhythm, normal heart sounds and intact distal pulses.   Pulmonary/Chest: Effort normal and breath sounds normal. He exhibits no bony tenderness.  Abdominal: Soft. There is no tenderness.  Genitourinary:  No costovertebral angle tenderness  Musculoskeletal: Normal range of motion.  Neurological: He is alert and oriented to person, place, and time. No cranial nerve deficit or sensory deficit. He exhibits normal muscle tone. Coordination normal.  Skin: Skin is warm, dry and intact.  Psychiatric: He has a normal mood and affect. His behavior is normal. Judgment and thought content normal.    ED Course  Procedures (including critical care time)  Medications  ondansetron (ZOFRAN) injection 4 mg (4 mg Intravenous Given 12/28/13 2239)  HYDROmorphone (DILAUDID) injection 1 mg (1 mg Intravenous Given 12/29/13 0014)  ondansetron (ZOFRAN) injection 4 mg (4 mg Intravenous Given 12/29/13 0014)    Patient Vitals for the past 24 hrs:  BP Temp Temp src Pulse Resp SpO2  12/29/13 0130 117/74 mmHg - - 63 15 97 %  12/29/13 0100 130/79 mmHg - - 61 18 93 %  12/29/13 0030  114/66 mmHg - - 72 17 95 %  12/29/13 0015 114/72 mmHg - - 63 23 93 %  12/28/13 2230 136/75 mmHg - - - 17 -  12/28/13 2225 143/99 mmHg - - - - -  12/28/13 2208 155/98 mmHg 98 F (36.7 C) Oral 66 18 99 %       Labs Review Labs Reviewed  URINALYSIS, ROUTINE W REFLEX MICROSCOPIC - Abnormal; Notable for the following:    APPearance CLOUDY (*)    Hgb urine dipstick MODERATE (*)    All other components within normal limits  COMPREHENSIVE METABOLIC PANEL - Abnormal;  Notable for the following:    Glucose, Bld 109 (*)    GFR calc non Af Amer 74 (*)    GFR calc Af Amer 85 (*)    All other components within normal limits  URINE MICROSCOPIC-ADD ON - Abnormal; Notable for the following:    Crystals CA OXALATE CRYSTALS (*)    All other components within normal limits  CBC WITH DIFFERENTIAL    Imaging Review Ct Abdomen Pelvis Wo Contrast  12/29/2013   CLINICAL DATA:  Right flank pain and nausea.  EXAM: CT ABDOMEN AND PELVIS WITHOUT CONTRAST  TECHNIQUE: Multidetector CT imaging of the abdomen and pelvis was performed following the standard protocol without IV contrast.  COMPARISON:  CT of the abdomen and pelvis performed 06/11/2012  FINDINGS: Mild bibasilar atelectasis is noted.  The liver and spleen are unremarkable in appearance. The gallbladder is within normal limits. The pancreas and adrenal glands are unremarkable.  A 4.9 cm mildly lobulated simple appearing cyst is noted at the right kidney, increased in size from 2013. A nonobstructing 6 mm stone is noted near the lower pole of the left kidney. The kidneys are otherwise unremarkable in appearance.  There is no evidence of hydronephrosis. A small 3 mm stone is noted at the right side of the base of the bladder, likely reflecting a recently passed stone. No obstructing ureteral stones are seen. No perinephric stranding is appreciated.  No free fluid is identified. The small bowel is unremarkable in appearance. The stomach is within normal limits. No acute vascular abnormalities are seen. Minimal calcification is seen along the distal abdominal aorta. Incidental note is made of a retroaortic left renal vein.  The appendix is normal in caliber, without evidence for appendicitis. The colon is unremarkable in appearance.  The bladder is mildly distended. There is chronic infiltration of fat within the wall of the bladder. The prostate is mildly enlarged, measuring 5.0 cm in transverse dimension. No inguinal  lymphadenopathy is seen.  No acute osseous abnormalities are identified. Multilevel vacuum phenomenon is noted along the lower lumbar spine.  IMPRESSION: 1. 3 mm stone at the right side of the base of the bladder may reflect a recently passed stone, given the patient's symptoms. No evidence of hydronephrosis at this time. 2. Nonobstructing 6 mm stone near the lower pole of the left kidney. 4.9 cm right renal cyst has increased in size from 2013. 3. Mild bibasilar atelectasis noted. 4. Mildly enlarged prostate seen.   Electronically Signed   By: Roanna RaiderJeffery  Chang M.D.   On: 12/29/2013 00:34     EKG Interpretation None      MDM   Final diagnoses:  Ureteral colic  Kidney stone    Flank pain, consistent with ureteral colic, and likely passed kidney stone on CT evaluation. Doubt UTI, serious bacterial infection or metabolic instability.  Nursing Notes Reviewed/ Care Coordinated Applicable Imaging Reviewed  Interpretation of Laboratory Data incorporated into ED treatment  The patient appears reasonably screened and/or stabilized for discharge and I doubt any other medical condition or other [facility] requiring further screening, evaluation, or treatment in the ED at this time prior to discharge.  Plan: Home Medications- Percocet; Home Treatments- rest; return here if the recommended treatment, does not improve the symptoms; Recommended follow up- Urology f/u in 3-4 days    Flint MelterElliott L Jerimy Johanson, MD 12/29/13 [id number]

## 2013-12-29 MED ORDER — OXYCODONE-ACETAMINOPHEN 5-325 MG PO TABS: 1.0000 | ORAL_TABLET | ORAL | Status: DC | PRN

## 2013-12-29 MED ORDER — HYDROMORPHONE HCL PF 1 MG/ML IJ SOLN
1.0000 mg | Freq: Once | INTRAMUSCULAR | Status: AC
  Administered 2013-12-29: 1 mg via INTRAVENOUS
  Filled 2013-12-29: qty 1

## 2013-12-29 MED ORDER — ONDANSETRON HCL 4 MG/2ML IJ SOLN
4.0000 mg | Freq: Once | INTRAMUSCULAR | Status: AC
  Administered 2013-12-29: 4 mg via INTRAVENOUS
  Filled 2013-12-29: qty 2

## 2013-12-29 NOTE — ED Notes (Signed)
MD at bedside. 

## 2013-12-29 NOTE — Discharge Instructions (Signed)
Take the pain medicine regularly for another 12-24 hours. See, your urologist, for checkup in 1 or 2 weeks.  Return to the emergency department if needed for problems  Kidney Stones Kidney stones (urolithiasis) are deposits that form inside your kidneys. The intense pain is caused by the stone moving through the urinary tract. When the stone moves, the ureter goes into spasm around the stone. The stone is usually passed in the urine.  CAUSES   A disorder that makes certain neck glands produce too much parathyroid hormone (primary hyperparathyroidism).  A buildup of uric acid crystals, similar to gout in your joints.  Narrowing (stricture) of the ureter.  A kidney obstruction present at birth (congenital obstruction).  Previous surgery on the kidney or ureters.  Numerous kidney infections. SYMPTOMS   Feeling sick to your stomach (nauseous).  Throwing up (vomiting).  Blood in the urine (hematuria).  Pain that usually spreads (radiates) to the groin.  Frequency or urgency of urination. DIAGNOSIS   Taking a history and physical exam.  Blood or urine tests.  CT scan.  Occasionally, an examination of the inside of the urinary bladder (cystoscopy) is performed. TREATMENT   Observation.  Increasing your fluid intake.  Extracorporeal shock wave lithotripsy This is a noninvasive procedure that uses shock waves to break up kidney stones.  Surgery may be needed if you have severe pain or persistent obstruction. There are various surgical procedures. Most of the procedures are performed with the use of small instruments. Only small incisions are needed to accommodate these instruments, so recovery time is minimized. The size, location, and chemical composition are all important variables that will determine the proper choice of action for you. Talk to your health care provider to better understand your situation so that you will minimize the risk of injury to yourself and your  kidney.  HOME CARE INSTRUCTIONS   Drink enough water and fluids to keep your urine clear or pale yellow. This will help you to pass the stone or stone fragments.  Strain all urine through the provided strainer. Keep all particulate matter and stones for your health care provider to see. The stone causing the pain may be as small as a grain of salt. It is very important to use the strainer each and every time you pass your urine. The collection of your stone will allow your health care provider to analyze it and verify that a stone has actually passed. The stone analysis will often identify what you can do to reduce the incidence of recurrences.  Only take over-the-counter or prescription medicines for pain, discomfort, or fever as directed by your health care provider.  Make a follow-up appointment with your health care provider as directed.  Get follow-up X-rays if required. The absence of pain does not always mean that the stone has passed. It may have only stopped moving. If the urine remains completely obstructed, it can cause loss of kidney function or even complete destruction of the kidney. It is your responsibility to make sure X-rays and follow-ups are completed. Ultrasounds of the kidney can show blockages and the status of the kidney. Ultrasounds are not associated with any radiation and can be performed easily in a matter of minutes. SEEK MEDICAL CARE IF:  You experience pain that is progressive and unresponsive to any pain medicine you have been prescribed. SEEK IMMEDIATE MEDICAL CARE IF:   Pain cannot be controlled with the prescribed medicine.  You have a fever or shaking chills.  The severity  or intensity of pain increases over 18 hours and is not relieved by pain medicine.  You develop a new onset of abdominal pain.  You feel faint or pass out.  You are unable to urinate. MAKE SURE YOU:   Understand these instructions.  Will watch your condition.  Will get help  right away if you are not doing well or get worse. Document Released: 07/04/2005 Document Revised: 03/06/2013 Document Reviewed: 12/05/2012 [facility] Patient Information 2014 [place].  Diet for Kidney Stones Kidney stones are small, hard masses that form inside your kidneys. They are made up of salts and minerals and often form when high levels build up in the urine. The minerals can then start to build up, crystalize, and stick together to form stones. There are several different types of kidney stones. The following types of stones may be influenced by dietary factors:   Calcium Oxalate Stones. An oxalate is a salt found in certain foods. Within the body, calcium can combine with oxalates to form calcium oxalate stones, which can be excreted in the urine in high amounts. This is the most common type of kidney stone.  Calcium Phosphate Stones. These stones may occur when the pH of the urine becomes too high, or less acidic, from too much calcium being excreted in the urine. The pH is a measure of how acidic or basic a substance is.  Uric Acid Stones. This type of stone occurs when the pH of the urine becomes too low, or very acidic, because substances called purines build up in the urine. Purines are found in animal proteins. When the urine is highly concentrated with acid, uric acid kidney stones can form.  Other risk factors for kidney stones include genetics, environment, and being overweight. Your caregiver may ask you to follow specific diet guidelines based on the type of stone you have to lessen the chances of your body making more kidney stones.  GENERAL GUIDELINES FOR ALL TYPES OF STONES  Drink plenty of fluid. Drink 12 16 cups of fluid a day, drinking mainly water.This is the most important thing you can do to prevent the formation of future kidney stones.  Maintain a healthy weight. Your caregiver or dietitian can help you determine what a healthy weight is for you. If you are  overweight, weight loss may help prevent the formation of future kidney stones.  Eat a diet adequate in animal protein. Too much animal protein can contribute to the formation of stones. Your dietitian can help you determine how much protein you should be eating. Avoid low carbohydrate, high protein diets.  Follow a balanced eating approach. The DASH diet, which stands for "Dietary Approaches to Stop Hypertension," is an effective meal plan for reducing stone formation. This diet is high in fruits, vegetables, dairy, and whole grains and low in animal protein. Ask your caregiver or dietitian for information about the DASH diet. ADDITIONAL DIET GUIDELINES FOR CALCIUM STONES Avoid foods high in salt. This includes table salt, salt seasonings, MSG, soy sauce, cured and processed meats, salted crackers and snack foods, fast food, and canned soups and foods. Ask your caregiver or dietitian for information about reducing sodium in your diet or following the low sodium diet.  Ensure adequate calcium intake. Use the following table for calcium guidelines:  Men 758 years old and younger  1000 mg/day.  Men 63 years old and older  1500 mg/day.  Women 4025 63 years old  1000 mg/day.  Women 50 years and older  1500 mg/day. Your dietitian can help you determine if you are getting enough calcium in your diet. Foods that are high in calcium include dairy products, broccoli, cheese, yogurt, and pudding. If you need to take a calcium supplement, take it only in the form of calcium citrate.  Avoid foods high in oxalate. Be sure that any supplements you take do not contain more than 500 mg of vitamin C. Vitamin C is converted into oxalate in the body. You do not need to avoid fruits and vegetables high in vitamin C.   Grains: High-fiber or bran cereal, whole-wheat bread, grits, barley, buckwheat, amaranth, pretzels, and fruitcake.  Vegetables: Dried beans, wax beans, dark leafy greens, eggplant, leeks, okra, parsley,  rutabaga, tomato paste, watercress, zucchini, and escarole.  Fruit: Dried apricots, red currants, figs, kiwi, and rhubarb.  Meat and Meat Substitutes: Soybeans and foods made from soy (soyburger, miso), dried beans, peanut butter.  Milk: Chocolate milk mixes and soymilk.  Fats and Oils: Nuts (peanuts, almonds, pecans, cashews, hazelnuts) and nut butters, sesame seeds, and tDahini paste.  Condiments/Miscellaneous: Chocolate, carob, marmalade, poppy seeds, instant iced tea, and juice from high-oxalate fruits.  Document Released: 10/29/2010 Document Revised: 01/03/2012 Document Reviewed: 12/19/2011 [facility] Patient Information 2014 [place], [place].

## 2013-12-30 ENCOUNTER — Emergency Department (HOSPITAL_COMMUNITY): Payer: 59

## 2013-12-30 ENCOUNTER — Observation Stay (HOSPITAL_COMMUNITY)
Admission: EM | Admit: 2013-12-30 | Discharge: 2013-12-31 | Disposition: A | Discharge status: Home / Self Care | Payer: 59 | Attending: Urology | Admitting: Urology

## 2013-12-30 ENCOUNTER — Encounter (HOSPITAL_COMMUNITY): Payer: Self-pay | Admitting: Urology

## 2013-12-30 DIAGNOSIS — R1115 Cyclical vomiting syndrome unrelated to migraine: Secondary | ICD-10-CM | POA: Insufficient documentation

## 2013-12-30 DIAGNOSIS — N23 Unspecified renal colic: Secondary | ICD-10-CM

## 2013-12-30 DIAGNOSIS — N201 Calculus of ureter: Principal | ICD-10-CM | POA: Diagnosis present

## 2013-12-30 HISTORY — DX: Other specified postprocedural states: Z98.890

## 2013-12-30 HISTORY — DX: Other specified postprocedural states: R11.2

## 2013-12-30 LAB — COMPREHENSIVE METABOLIC PANEL
ALBUMIN: 4 g/dL (ref 3.5–5.2)
ALK PHOS: 54 U/L (ref 39–117)
ALT: 15 U/L (ref 0–53)
AST: 22 U/L (ref 0–37)
BILIRUBIN TOTAL: 0.9 mg/dL (ref 0.3–1.2)
BUN: 14 mg/dL (ref 6–23)
CHLORIDE: 103 meq/L (ref 96–112)
CO2: 26 meq/L (ref 19–32)
Calcium: 9.2 mg/dL (ref 8.4–10.5)
Creatinine, Ser: 1.18 mg/dL (ref 0.50–1.35)
GFR calc Af Amer: 74 mL/min — ABNORMAL LOW (ref >90)
GFR, EST NON AFRICAN AMERICAN: 64 mL/min — AB (ref >90)
Glucose, Bld: 128 mg/dL — ABNORMAL HIGH (ref 70–99)
POTASSIUM: 4.4 meq/L (ref 3.7–5.3)
Sodium: 141 mEq/L (ref 137–147)
Total Protein: 7.5 g/dL (ref 6.0–8.3)

## 2013-12-30 LAB — URINALYSIS, ROUTINE W REFLEX MICROSCOPIC
Bilirubin Urine: NEGATIVE
GLUCOSE, UA: NEGATIVE mg/dL
Ketones, ur: NEGATIVE mg/dL
LEUKOCYTES UA: NEGATIVE
Nitrite: NEGATIVE
PH: 6.5 (ref 5.0–8.0)
Protein, ur: NEGATIVE mg/dL
Specific Gravity, Urine: 1.025 (ref 1.005–1.030)
Urobilinogen, UA: 1 mg/dL (ref 0.0–1.0)

## 2013-12-30 LAB — URINE MICROSCOPIC-ADD ON

## 2013-12-30 LAB — CBC WITH DIFFERENTIAL/PLATELET
BASOS ABS: 0 10*3/uL (ref 0.0–0.1)
BASOS PCT: 0 % (ref 0–1)
Eosinophils Absolute: 0 10*3/uL (ref 0.0–0.7)
Eosinophils Relative: 0 % (ref 0–5)
HCT: 39.8 % (ref 39.0–52.0)
Hemoglobin: 13.9 g/dL (ref 13.0–17.0)
Lymphocytes Relative: 13 % (ref 12–46)
Lymphs Abs: 1.3 10*3/uL (ref 0.7–4.0)
MCH: 31.7 pg (ref 26.0–34.0)
MCHC: 34.9 g/dL (ref 30.0–36.0)
MCV: 90.9 fL (ref 78.0–100.0)
Monocytes Absolute: 0.5 10*3/uL (ref 0.1–1.0)
Monocytes Relative: 5 % (ref 3–12)
NEUTROS ABS: 8.1 10*3/uL — AB (ref 1.7–7.7)
NEUTROS PCT: 82 % — AB (ref 43–77)
Platelets: 202 10*3/uL (ref 150–400)
RBC: 4.38 MIL/uL (ref 4.22–5.81)
RDW: 13.8 % (ref 11.5–15.5)
WBC: 9.9 10*3/uL (ref 4.0–10.5)

## 2013-12-30 LAB — LIPASE, BLOOD: Lipase: 23 U/L (ref 11–59)

## 2013-12-30 LAB — I-STAT TROPONIN, ED: Troponin i, poc: 0 ng/mL (ref 0.00–0.08)

## 2013-12-30 MED ORDER — BISACODYL 10 MG RE SUPP: 10.0000 mg | Freq: Every day | RECTAL | Status: DC | PRN

## 2013-12-30 MED ORDER — ACETAMINOPHEN 325 MG PO TABS: 650.0000 mg | ORAL_TABLET | ORAL | Status: DC | PRN

## 2013-12-30 MED ORDER — HYDROMORPHONE HCL PF 1 MG/ML IJ SOLN
1.0000 mg | Freq: Once | INTRAMUSCULAR | Status: AC
  Administered 2013-12-30: 1 mg via INTRAVENOUS
  Filled 2013-12-30: qty 1

## 2013-12-30 MED ORDER — SODIUM CHLORIDE 0.9 % IV BOLUS (SEPSIS)
1000.0000 mL | Freq: Once | INTRAVENOUS | Status: AC
  Administered 2013-12-30: 1000 mL via INTRAVENOUS

## 2013-12-30 MED ORDER — HYDROMORPHONE HCL PF 1 MG/ML IJ SOLN
0.5000 mg | INTRAMUSCULAR | Status: DC | PRN
  Administered 2013-12-30 – 2013-12-31 (×4): 1 mg via INTRAVENOUS
  Filled 2013-12-30 (×4): qty 1

## 2013-12-30 MED ORDER — TAMSULOSIN HCL 0.4 MG PO CAPS
0.4000 mg | ORAL_CAPSULE | Freq: Every day | ORAL | Status: DC
  Administered 2013-12-30 – 2013-12-31 (×2): 0.4 mg via ORAL
  Filled 2013-12-30 (×2): qty 1

## 2013-12-30 MED ORDER — PROMETHAZINE HCL 25 MG/ML IJ SOLN
12.5000 mg | Freq: Once | INTRAMUSCULAR | Status: AC
  Administered 2013-12-30: 12.5 mg via INTRAVENOUS
  Filled 2013-12-30: qty 1

## 2013-12-30 MED ORDER — LORAZEPAM 2 MG/ML IJ SOLN
1.0000 mg | Freq: Once | INTRAMUSCULAR | Status: DC
  Filled 2013-12-30: qty 1

## 2013-12-30 MED ORDER — PROMETHAZINE HCL 25 MG/ML IJ SOLN
12.5000 mg | Freq: Four times a day (QID) | INTRAMUSCULAR | Status: DC | PRN
  Administered 2013-12-30: 12.5 mg via INTRAVENOUS
  Filled 2013-12-30: qty 1

## 2013-12-30 MED ORDER — ZOLPIDEM TARTRATE 5 MG PO TABS: 5.0000 mg | ORAL_TABLET | Freq: Every evening | ORAL | Status: DC | PRN

## 2013-12-30 MED ORDER — KCL IN DEXTROSE-NACL 20-5-0.45 MEQ/L-%-% IV SOLN
INTRAVENOUS | Status: DC
  Administered 2013-12-30: 21:00:00 via INTRAVENOUS
  Administered 2013-12-31: 100 mL/h via INTRAVENOUS
  Filled 2013-12-30 (×4): qty 1000

## 2013-12-30 MED ORDER — ONDANSETRON HCL 4 MG/2ML IJ SOLN
4.0000 mg | Freq: Once | INTRAMUSCULAR | Status: AC
  Administered 2013-12-30: 4 mg via INTRAVENOUS
  Filled 2013-12-30: qty 2

## 2013-12-30 MED ORDER — DOCUSATE SODIUM 100 MG PO CAPS
100.0000 mg | ORAL_CAPSULE | Freq: Two times a day (BID) | ORAL | Status: DC
  Administered 2013-12-31: 100 mg via ORAL
  Filled 2013-12-30 (×3): qty 1

## 2013-12-30 MED ORDER — ONDANSETRON HCL 4 MG/2ML IJ SOLN
4.0000 mg | Freq: Once | INTRAMUSCULAR | Status: AC
Start: 2013-12-30 — End: 2013-12-30
  Administered 2013-12-30: 4 mg via INTRAVENOUS
  Filled 2013-12-30: qty 2

## 2013-12-30 MED ORDER — OXYCODONE HCL 5 MG PO TABS: 5.0000 mg | ORAL_TABLET | ORAL | Status: DC | PRN

## 2013-12-30 NOTE — ED Notes (Signed)
Patient transported to X-ray 

## 2013-12-30 NOTE — ED Notes (Signed)
Pt actively vomiting.  Dr. Anitra LauthPlunkett aware, awaiting further orders.

## 2013-12-30 NOTE — ED Notes (Signed)
US tech at bedside

## 2013-12-30 NOTE — ED Provider Notes (Signed)
CSN: [id number]     Arrival date & time 12/30/13  1325 History   None    Chief Complaint  Patient presents with  . Nephrolithiasis  . Emesis     (Consider location/radiation/quality/duration/timing/severity/associated sxs/prior Treatment) HPI  Phillip Shannon is a 63 y.o. male who is otherwise healthy except for history of kidney stones (which required lithotripsy)  complaining of worsening right flank pain over the course of the last 2 days. Patient was seen 2 days ago and found to have a 3mm stone in the bladder and enlarging right renal cyst and LEFT 6 mm nonobstructing right-sided infrarenal stone. Patient states pain has gotten worse  despite oxycodone.  Pain is in the right flank, radiates down to the groin and up to the right chest. He has associated symptoms of chills and bilious vomiting (10 times) starting this a.m. patient also reports a right-sided chest pain onset several hours ago. Patient has been taking Zofran with no relief at home today. He saw his primary care, was given Zofran and Toradol with little relief. Could not get in to see his urologist was instructed to present to the ED. Patient denies fever, shortness of breath, cough, change in bowel or bladder habits. Patient had hematuria since 3 days ago  Urology: Isabel CapriceGrapey  Past Medical History  Diagnosis Date  . Kidney stone    Past Surgical History  Procedure Laterality Date  . Back surgery    . Lithotripsy     No family history on file. History  Substance Use Topics  . Smoking status: Never Smoker   . Smokeless tobacco: Never Used  . Alcohol Use: No    Review of Systems  10 systems reviewed and found to be negative, except as noted in the HPI.   Allergies  Codeine  Home Medications   Prior to Admission medications   Medication Sig Start Date End Date Taking? Authorizing Provider  naproxen sodium (ANAPROX) 220 MG tablet Take 220-440 mg by mouth daily as needed (for pain).   Yes Historical Provider, MD   oxyCODONE-acetaminophen (PERCOCET) 5-325 MG per tablet Take 1 tablet by mouth every 4 (four) hours as needed for severe pain. 12/29/13  Yes Flint MelterElliott L Wentz, MD   BP 142/77  Pulse 65  Temp(Src) 98.4 F (36.9 C) (Oral)  Resp 16  SpO2 91% Physical Exam  Nursing note and vitals reviewed. Constitutional: He is oriented to person, place, and time. He appears well-developed and well-nourished. No distress.  HENT:  Head: Normocephalic.  Mouth/Throat: Oropharynx is clear and moist.  Eyes: Conjunctivae and EOM are normal. Pupils are equal, round, and reactive to light.  Neck: Normal range of motion. Neck supple. No JVD present.  Cardiovascular: Normal rate, regular rhythm and intact distal pulses.   Pulmonary/Chest: Effort normal and breath sounds normal. No stridor. No respiratory distress. He has no wheezes. He has no rales. He exhibits no tenderness.  Abdominal: Soft. Bowel sounds are normal. He exhibits no distension and no mass. There is no tenderness. There is no rebound and no guarding.  Genitourinary:  No CVA tenderness palpation bilaterally  Musculoskeletal: Normal range of motion. He exhibits no tenderness.  No calf asymmetry, superficial collaterals, palpable cords, edema, Homans sign negative bilaterally.    Neurological: He is alert and oriented to person, place, and time.  Skin: Skin is warm.  Psychiatric: He has a normal mood and affect. His behavior is normal.    ED Course  Procedures (including critical care time) Labs Review  Labs Reviewed  CBC WITH DIFFERENTIAL - Abnormal; Notable for the following:    Neutrophils Relative % 82 (*)    Neutro Abs 8.1 (*)    All other components within normal limits  COMPREHENSIVE METABOLIC PANEL - Abnormal; Notable for the following:    Glucose, Bld 128 (*)    GFR calc non Af Amer 64 (*)    GFR calc Af Amer 74 (*)    All other components within normal limits  URINALYSIS, ROUTINE W REFLEX MICROSCOPIC - Abnormal; Notable for the  following:    Hgb urine dipstick TRACE (*)    All other components within normal limits  LIPASE, BLOOD  URINE MICROSCOPIC-ADD ON  I-STAT TROPOININ, ED    Imaging Review Ct Abdomen Pelvis Wo Contrast  12/29/2013   CLINICAL DATA:  Right flank pain and nausea.  EXAM: CT ABDOMEN AND PELVIS WITHOUT CONTRAST  TECHNIQUE: Multidetector CT imaging of the abdomen and pelvis was performed following the standard protocol without IV contrast.  COMPARISON:  CT of the abdomen and pelvis performed 06/11/2012  FINDINGS: Mild bibasilar atelectasis is noted.  The liver and spleen are unremarkable in appearance. The gallbladder is within normal limits. The pancreas and adrenal glands are unremarkable.  A 4.9 cm mildly lobulated simple appearing cyst is noted at the right kidney, increased in size from 2013. A nonobstructing 6 mm stone is noted near the lower pole of the left kidney. The kidneys are otherwise unremarkable in appearance.  There is no evidence of hydronephrosis. A small 3 mm stone is noted at the right side of the base of the bladder, likely reflecting a recently passed stone. No obstructing ureteral stones are seen. No perinephric stranding is appreciated.  No free fluid is identified. The small bowel is unremarkable in appearance. The stomach is within normal limits. No acute vascular abnormalities are seen. Minimal calcification is seen along the distal abdominal aorta. Incidental note is made of a retroaortic left renal vein.  The appendix is normal in caliber, without evidence for appendicitis. The colon is unremarkable in appearance.  The bladder is mildly distended. There is chronic infiltration of fat within the wall of the bladder. The prostate is mildly enlarged, measuring 5.0 cm in transverse dimension. No inguinal lymphadenopathy is seen.  No acute osseous abnormalities are identified. Multilevel vacuum phenomenon is noted along the lower lumbar spine.  IMPRESSION: 1. 3 mm stone at the right side of  the base of the bladder may reflect a recently passed stone, given the patient's symptoms. No evidence of hydronephrosis at this time. 2. Nonobstructing 6 mm stone near the lower pole of the left kidney. 4.9 cm right renal cyst has increased in size from 2013. 3. Mild bibasilar atelectasis noted. 4. Mildly enlarged prostate seen.   Electronically Signed   By: Roanna RaiderJeffery  Chang M.D.   On: 12/29/2013 00:34   Dg Chest 2 View  12/30/2013   CLINICAL DATA:  Emesis and kidney disease  EXAM: CHEST  2 VIEW  COMPARISON:  None.  FINDINGS: Lungs are clear. Heart is mildly enlarged with normal pulmonary vascularity. No adenopathy. There is degenerative change in the thoracic spine.  IMPRESSION: No edema or consolidation.  Heart mildly enlarged.   Electronically Signed   By: Bretta BangWilliam  Woodruff M.D.   On: 12/30/2013 15:21   [place] Abdomen Complete  12/30/2013   CLINICAL DATA:  63 year old male with abdominal pain, nausea and vomiting. History of urinary calculi.  EXAM: ULTRASOUND ABDOMEN COMPLETE  COMPARISON:  12/28/2013 and  prior CTs.  FINDINGS: Gallbladder:  The gallbladder is unremarkable. There is no evidence of cholelithiasis or acute cholecystitis.  Common bile duct:  Diameter: 2.9 mm. There is no evidence of intrahepatic or extrahepatic biliary dilatation.  Liver:  No focal lesion identified. Within normal limits in parenchymal echogenicity.  IVC:  No abnormality visualized.  Pancreas:  Visualized portion unremarkable.  Spleen:  Size and appearance within normal limits.  Right Kidney:  Length: 12.1 cm. Increased renal echogenicity identified. A 3.5 x 4 cm mid renal cyst is present. There is no evidence of solid mass or hydronephrosis.  Left Kidney:  Length: 12.2 cm. Increased renal echogenicity identified. There is no evidence of solid mass or hydronephrosis.  Abdominal aorta:  No aneurysm identified, but the mid and distal abdominal aorta are obscured by bowel gas.  Other findings:  None.  IMPRESSION: Increased renal  echogenicity likely representing medical renal disease. No evidence of hydronephrosis.  Unremarkable gallbladder.  Mid and distal abdominal aorta not well visualized.   Electronically Signed   By: Laveda Abbe M.D.   On: 12/30/2013 16:34     EKG Interpretation   Date/Time:  Monday December 30 2013 13:53:05 EDT Ventricular Rate:  62 PR Interval:  172 QRS Duration: 99 QT Interval:  410 QTC Calculation: 416 R Axis:   27 Text Interpretation:  Sinus rhythm Abnormal R-wave progression, early  transition Confirmed by WARD,  DO, KRISTEN ([id number]) on 12/30/2013 5:41:12 PM      5:40 PM, patient seen and evaluated the bedside, he rates his pain at 8/10. States he remains nauseous, last for up one hour ago. Serial abdominal exam remains benign with no tenderness to deep palpation of any quadrant, no guarding or rebound and normal bowel sounds.  MDM   Final diagnoses:  Renal colic on right side  Emesis, persistent    Filed Vitals:   12/30/13 1336 12/30/13 1643 12/30/13 1739  BP: 172/95 165/79 142/77  Pulse: 66 66 65  Temp:  98.4 F (36.9 C)   TempSrc:  Oral   Resp: 18 16   SpO2: 93% 96% 91%    Medications  dextrose 5 % and 0.45 % NaCl with KCl 20 mEq/L infusion (not administered)  acetaminophen (TYLENOL) tablet 650 mg (not administered)  oxyCODONE (Oxy IR/ROXICODONE) immediate release tablet 5 mg (not administered)  HYDROmorphone (DILAUDID) injection 0.5-1 mg (not administered)  zolpidem (AMBIEN) tablet 5 mg (not administered)  docusate sodium (COLACE) capsule 100 mg (not administered)  bisacodyl (DULCOLAX) suppository 10 mg (not administered)  promethazine (PHENERGAN) injection 12.5 mg (not administered)  tamsulosin (FLOMAX) capsule 0.4 mg (not administered)  ondansetron (ZOFRAN) injection 4 mg (4 mg Intravenous Given 12/30/13 1410)  ondansetron (ZOFRAN) injection 4 mg (4 mg Intravenous Given 12/30/13 1435)  HYDROmorphone (DILAUDID) injection 1 mg (1 mg Intravenous Given 12/30/13 1435)   sodium chloride 0.9 % bolus 1,000 mL (0 mLs Intravenous Stopped 12/30/13 1538)  sodium chloride 0.9 % bolus 1,000 mL (0 mLs Intravenous Stopped 12/30/13 1644)  promethazine (PHENERGAN) injection 12.5 mg (12.5 mg Intravenous Given 12/30/13 1741)  sodium chloride 0.9 % bolus 1,000 mL (0 mLs Intravenous Stopped 12/30/13 1835)    CARLON CHALOUX is a 63 y.o. male presenting with right-sided flank pain radiating down to her groin and up into the chest. It is worsening of the course of the last 48 hours. No systemic signs of infection. Patient has had multiple episodes of vomiting onset this a.m. The vomiting seems to be tied to increase in  his pain level. Serial abdominal exams are benign, patient has no CVA tenderness palpation. Urinalysis is not consistent with infection. Cardiac workup negative, patient is low risk by Wells criteria, doubt PE. Blood work is otherwise unremarkable. Ultrasound shows no right hydronephrosis. I would not like to repeat the CAT scan as he had one less than 72 hours ago.  Urology consult from Dr. Annabell Howells appreciated: She is comfortable evaluate the patient and will admit to his service.    Wynetta Emery, PA-C 12/30/13 1908

## 2013-12-30 NOTE — ED Notes (Signed)
Provided pt with urinal and asked him to notify when he was able to void

## 2013-12-30 NOTE — ED Notes (Signed)
Pt tolerating small bites saltine crackers and sips of ginger ale at this time.

## 2013-12-30 NOTE — ED Notes (Signed)
RN Annabelle HarmanDana attempted to call report again

## 2013-12-30 NOTE — ED Notes (Signed)
Attempted to call report to floor, RN unavailable at this time.  

## 2013-12-30 NOTE — Consult Note (Signed)
Subjective: [name] is a patient of Dr. Isabel CapriceGrapey with a history of stones.  I was asked to see him in consultation by Dr. Elesa MassedWard for intractable pain with a 3.403mm right UVJ stone.  He had the onset of the pain on Saturday and was seen in the ER when the CT was done.  He returned today with increased pain.  He had Toradol this am from Dr. Maurice SmallElaine Griffin and got some relief.  He had recurrent pain with nausea and vomiting and went back to the ER.  He has gotten some relief with dilaudid and phenergan.  He had some crackers and ginger ale this evening.  He had hematuria on Saturday.  He has had no urgency.  He has had prior lithotripsy about a year ago on the left.  He still has a 6mm LLP stone.  ROS:  Review of Systems  Constitutional: Positive for chills and diaphoresis. Negative for fever.  Cardiovascular: Positive for chest pain (with radiation into the epigastric region. ).  Gastrointestinal: Positive for nausea, vomiting and abdominal pain.  Genitourinary: Positive for hematuria.  All other systems reviewed and are negative.  Allergies  Allergen Reactions  . Codeine Nausea And Vomiting    Past Medical History  Diagnosis Date  . Kidney stone     Past Surgical History  Procedure Laterality Date  . Back surgery    . Lithotripsy      History   Social History  . Marital Status: Married    Spouse Name: N/A    Number of Children: N/A  . Years of Education: N/A   Occupational History  . Not on file.   Social History Main Topics  . Smoking status: Never Smoker   . Smokeless tobacco: Never Used  . Alcohol Use: No  . Drug Use: No  . Sexual Activity: Not on file   Other Topics Concern  . Not on file   Social History Narrative  . No narrative on file    No family history on file.   Anti-infectives: Anti-infectives   None      No current facility-administered medications for this encounter.   Current Outpatient Prescriptions  Medication Sig Dispense Refill   . naproxen sodium (ANAPROX) 220 MG tablet Take 220-440 mg by mouth daily as needed (for pain).      Marland Kitchen. oxyCODONE-acetaminophen (PERCOCET) 5-325 MG per tablet Take 1 tablet by mouth every 4 (four) hours as needed for severe pain.  20 tablet  0     Objective: Vital signs in last 24 hours: Temp:  [98.4 F (36.9 C)] 98.4 F (36.9 C) (06/15 1643) Pulse Rate:  [65-66] 65 (06/15 1739) Resp:  [16-18] 16 (06/15 1643) BP: (142-172)/(77-95) 142/77 mmHg (06/15 1739) SpO2:  [91 %-96 %] 91 % (06/15 1739)  Intake/Output from previous day:   Intake/Output this shift:     Physical Exam  Constitutional: He is well-developed, well-nourished, and in no distress.  medicated  HENT:  Head: Normocephalic and atraumatic.  Neck: Normal range of motion. Neck supple. No JVD present. No thyromegaly present.  Cardiovascular: Normal rate, regular rhythm and normal heart sounds.   Pulmonary/Chest: Effort normal and breath sounds normal. No respiratory distress. He has no wheezes. He has no rales.  Abdominal: Soft. Bowel sounds are normal. He exhibits no distension and no mass. There is tenderness (right flank and lower quadrant).  Musculoskeletal: Normal range of motion. He exhibits no edema and no tenderness.  Lymphadenopathy:  He has no cervical adenopathy.  Neurological:  Medicated but oriented and responsive to questions.   Skin: Skin is warm and dry.  Psychiatric:  medicated    Lab Results:   Recent Labs  12/28/13 2239 12/30/13 1342  WBC 7.1 9.9  HGB 14.7 13.9  HCT 42.1 39.8  PLT 227 202   BMET  Recent Labs  12/28/13 2239 12/30/13 1342  NA 143 141  K 3.8 4.4  CL 102 103  CO2 25 26  GLUCOSE 109* 128*  BUN 14 14  CREATININE 1.05 1.18  CALCIUM 9.9 9.2   PT/INR No results found for this basename: LABPROT, INR,  in the last 72 hours ABG No results found for this basename: PHART, PCO2, PO2, HCO3,  in the last 72 hours  Studies/Results: Ct Abdomen Pelvis Wo  Contrast  12/29/2013   CLINICAL DATA:  Right flank pain and nausea.  EXAM: CT ABDOMEN AND PELVIS WITHOUT CONTRAST  TECHNIQUE: Multidetector CT imaging of the abdomen and pelvis was performed following the standard protocol without IV contrast.  COMPARISON:  CT of the abdomen and pelvis performed 06/11/2012  FINDINGS: Mild bibasilar atelectasis is noted.  The liver and spleen are unremarkable in appearance. The gallbladder is within normal limits. The pancreas and adrenal glands are unremarkable.  A 4.9 cm mildly lobulated simple appearing cyst is noted at the right kidney, increased in size from 2013. A nonobstructing 6 mm stone is noted near the lower pole of the left kidney. The kidneys are otherwise unremarkable in appearance.  There is no evidence of hydronephrosis. A small 3 mm stone is noted at the right side of the base of the bladder, likely reflecting a recently passed stone. No obstructing ureteral stones are seen. No perinephric stranding is appreciated.  No free fluid is identified. The small bowel is unremarkable in appearance. The stomach is within normal limits. No acute vascular abnormalities are seen. Minimal calcification is seen along the distal abdominal aorta. Incidental note is made of a retroaortic left renal vein.  The appendix is normal in caliber, without evidence for appendicitis. The colon is unremarkable in appearance.  The bladder is mildly distended. There is chronic infiltration of fat within the wall of the bladder. The prostate is mildly enlarged, measuring 5.0 cm in transverse dimension. No inguinal lymphadenopathy is seen.  No acute osseous abnormalities are identified. Multilevel vacuum phenomenon is noted along the lower lumbar spine.  IMPRESSION: 1. 3 mm stone at the right side of the base of the bladder may reflect a recently passed stone, given the patient's symptoms. No evidence of hydronephrosis at this time. 2. Nonobstructing 6 mm stone near the lower pole of the left  kidney. 4.9 cm right renal cyst has increased in size from 2013. 3. Mild bibasilar atelectasis noted. 4. Mildly enlarged prostate seen.   Electronically Signed   By: Roanna RaiderJeffery  Chang M.D.   On: 12/29/2013 00:34   Dg Chest 2 View  12/30/2013   CLINICAL DATA:  Emesis and kidney disease  EXAM: CHEST  2 VIEW  COMPARISON:  None.  FINDINGS: Lungs are clear. Heart is mildly enlarged with normal pulmonary vascularity. No adenopathy. There is degenerative change in the thoracic spine.  IMPRESSION: No edema or consolidation.  Heart mildly enlarged.   Electronically Signed   By: Bretta BangWilliam  Woodruff M.D.   On: 12/30/2013 15:21   [place] Abdomen Complete  12/30/2013   CLINICAL DATA:  63 year old male with abdominal pain, nausea and vomiting. History of urinary calculi.  EXAM: ULTRASOUND ABDOMEN COMPLETE  COMPARISON:  12/28/2013 and prior CTs.  FINDINGS: Gallbladder:  The gallbladder is unremarkable. There is no evidence of cholelithiasis or acute cholecystitis.  Common bile duct:  Diameter: 2.9 mm. There is no evidence of intrahepatic or extrahepatic biliary dilatation.  Liver:  No focal lesion identified. Within normal limits in parenchymal echogenicity.  IVC:  No abnormality visualized.  Pancreas:  Visualized portion unremarkable.  Spleen:  Size and appearance within normal limits.  Right Kidney:  Length: 12.1 cm. Increased renal echogenicity identified. A 3.5 x 4 cm mid renal cyst is present. There is no evidence of solid mass or hydronephrosis.  Left Kidney:  Length: 12.2 cm. Increased renal echogenicity identified. There is no evidence of solid mass or hydronephrosis.  Abdominal aorta:  No aneurysm identified, but the mid and distal abdominal aorta are obscured by bowel gas.  Other findings:  None.  IMPRESSION: Increased renal echogenicity likely representing medical renal disease. No evidence of hydronephrosis.  Unremarkable gallbladder.  Mid and distal abdominal aorta not well visualized.   Electronically Signed   By: Laveda Abbe M.D.   On: 12/30/2013 16:34   I have discussed the case with Dr. Elesa Massed and have reviewed the labs and x-ray reports and films.   Assessment: He has a 3.91mm RUVJ stone with intractable pain and nausea.  He has gotten some relief with dilaudid and phenergan.   He ate a bit this evening so he is not a good candidate for immediate ureteroscopy.   Plan: I am going to admit him for pain control and hydration and will keep him NPO p MN in case he needs ureteroscopy tomorrow.   I will start tamsulosin and will have him strain his urine.   CC: Dr. Belenda Cruise Ward    LOS: 0 days    Anner Crete 12/30/2013

## 2013-12-30 NOTE — ED Notes (Signed)
Pt c/o emesis, sent from urologist for symptom management of renal calculi.

## 2013-12-30 NOTE — ED Notes (Signed)
Initial Contact - pt to RM4 with family, changed to hospital gown.  Pt appears uncomfortable.  Reports c/o RUQ, epigastric and R flank pain.  [facility] and dx with kidney stones, pt reports long hx of kidney stones.  +nausea/dry heaves on assessment.  Pt denies fevers/chills.  Skin PWD.  MAEI, self repositioning for comfort.  NAD.

## 2013-12-30 NOTE — ED Provider Notes (Signed)
Medical screening examination/treatment/procedure(s) were performed by non-physician practitioner and as supervising physician I was immediately available for consultation/collaboration.   EKG Interpretation   Date/Time:  Monday December 30 2013 13:53:05 EDT Ventricular Rate:  62 PR Interval:  172 QRS Duration: 99 QT Interval:  410 QTC Calculation: 416 R Axis:   27 Text Interpretation:  Sinus rhythm Abnormal R-wave progression, early  transition Confirmed by Phillip Klemens,  DO, Magalie Almon ([id number] on 12/30/2013 5:41:12 PM        Layla MawKristen N Vieno Tarrant, DO 12/30/13 2346

## 2013-12-31 ENCOUNTER — Encounter (HOSPITAL_COMMUNITY)
Admission: EM | Disposition: A | Discharge status: Home / Self Care | Payer: Self-pay | Source: Home / Self Care | Attending: Emergency Medicine

## 2013-12-31 ENCOUNTER — Observation Stay (HOSPITAL_COMMUNITY): Payer: 59 | Admitting: Anesthesiology

## 2013-12-31 ENCOUNTER — Encounter (HOSPITAL_COMMUNITY): Payer: Self-pay

## 2013-12-31 ENCOUNTER — Encounter (HOSPITAL_COMMUNITY): Payer: 59 | Admitting: Anesthesiology

## 2013-12-31 HISTORY — PX: CYSTOSCOPY WITH RETROGRADE PYELOGRAM, URETEROSCOPY AND STENT PLACEMENT: SHX5789

## 2013-12-31 LAB — SURGICAL PCR SCREEN
MRSA, PCR: NEGATIVE
STAPHYLOCOCCUS AUREUS: NEGATIVE

## 2013-12-31 SURGERY — CYSTOURETEROSCOPY, WITH RETROGRADE PYELOGRAM AND STENT INSERTION
Anesthesia: General | Site: Pelvis | Laterality: Right

## 2013-12-31 MED ORDER — MIDAZOLAM HCL 5 MG/5ML IJ SOLN
INTRAMUSCULAR | Status: DC | PRN
  Administered 2013-12-31: 2 mg via INTRAVENOUS

## 2013-12-31 MED ORDER — CIPROFLOXACIN IN D5W 400 MG/200ML IV SOLN
400.0000 mg | INTRAVENOUS | Status: AC
  Administered 2013-12-31: 400 mg via INTRAVENOUS
  Filled 2013-12-31: qty 200

## 2013-12-31 MED ORDER — OXYCODONE-ACETAMINOPHEN 5-325 MG PO TABS: 1.0000 | ORAL_TABLET | ORAL | Status: DC | PRN

## 2013-12-31 MED ORDER — LACTATED RINGERS IV SOLN
INTRAVENOUS | Status: DC | PRN
  Administered 2013-12-31: 12:00:00 via INTRAVENOUS

## 2013-12-31 MED ORDER — LIDOCAINE HCL (CARDIAC) 20 MG/ML IV SOLN
INTRAVENOUS | Status: DC | PRN
  Administered 2013-12-31: 100 mg via INTRAVENOUS

## 2013-12-31 MED ORDER — SODIUM CHLORIDE 0.9 % IR SOLN
Status: DC | PRN
  Administered 2013-12-31: 4000 mL

## 2013-12-31 MED ORDER — TRIMETHOPRIM 100 MG PO TABS: 100.0000 mg | ORAL_TABLET | ORAL | Status: DC

## 2013-12-31 MED ORDER — LIDOCAINE HCL 2 % EX GEL
CUTANEOUS | Status: AC
  Filled 2013-12-31: qty 10

## 2013-12-31 MED ORDER — BELLADONNA ALKALOIDS-OPIUM 16.2-60 MG RE SUPP
RECTAL | Status: AC
  Filled 2013-12-31: qty 1

## 2013-12-31 MED ORDER — CIPROFLOXACIN IN D5W 400 MG/200ML IV SOLN
INTRAVENOUS | Status: AC
  Filled 2013-12-31: qty 200

## 2013-12-31 MED ORDER — FENTANYL CITRATE 0.05 MG/ML IJ SOLN
INTRAMUSCULAR | Status: AC
  Filled 2013-12-31: qty 5

## 2013-12-31 MED ORDER — LACTATED RINGERS IV SOLN: INTRAVENOUS | Status: DC

## 2013-12-31 MED ORDER — BELLADONNA ALKALOIDS-OPIUM 16.2-60 MG RE SUPP
RECTAL | Status: DC | PRN
  Administered 2013-12-31: 1 via RECTAL

## 2013-12-31 MED ORDER — PROPOFOL 10 MG/ML IV BOLUS
INTRAVENOUS | Status: DC | PRN
  Administered 2013-12-31: 200 mg via INTRAVENOUS

## 2013-12-31 MED ORDER — KETOROLAC TROMETHAMINE 30 MG/ML IJ SOLN
INTRAMUSCULAR | Status: AC
  Filled 2013-12-31: qty 1

## 2013-12-31 MED ORDER — ACETAMINOPHEN 10 MG/ML IV SOLN
INTRAVENOUS | Status: DC | PRN
  Administered 2013-12-31: 1000 mg via INTRAVENOUS

## 2013-12-31 MED ORDER — PROMETHAZINE HCL 25 MG/ML IJ SOLN: 6.2500 mg | INTRAMUSCULAR | Status: DC | PRN

## 2013-12-31 MED ORDER — PHENAZOPYRIDINE HCL 200 MG PO TABS: 200.0000 mg | ORAL_TABLET | Freq: Three times a day (TID) | ORAL | Status: DC | PRN

## 2013-12-31 MED ORDER — LIDOCAINE HCL 2 % EX GEL
CUTANEOUS | Status: DC | PRN
  Administered 2013-12-31: 1 via URETHRAL

## 2013-12-31 MED ORDER — SODIUM CHLORIDE 0.9 % IJ SOLN
INTRAMUSCULAR | Status: AC
  Filled 2013-12-31: qty 10

## 2013-12-31 MED ORDER — FENTANYL CITRATE 0.05 MG/ML IJ SOLN
INTRAMUSCULAR | Status: DC | PRN
  Administered 2013-12-31: 50 ug via INTRAVENOUS

## 2013-12-31 MED ORDER — ONDANSETRON HCL 4 MG/2ML IJ SOLN
INTRAMUSCULAR | Status: DC | PRN
  Administered 2013-12-31: 4 mg via INTRAVENOUS

## 2013-12-31 MED ORDER — LIDOCAINE HCL (CARDIAC) 20 MG/ML IV SOLN
INTRAVENOUS | Status: AC
  Filled 2013-12-31: qty 5

## 2013-12-31 MED ORDER — ACETAMINOPHEN 10 MG/ML IV SOLN
1000.0000 mg | Freq: Once | INTRAVENOUS | Status: DC
  Filled 2013-12-31: qty 100

## 2013-12-31 MED ORDER — ONDANSETRON HCL 4 MG/2ML IJ SOLN
INTRAMUSCULAR | Status: AC
  Filled 2013-12-31: qty 2

## 2013-12-31 MED ORDER — SODIUM CHLORIDE 0.45 % IV SOLN
INTRAVENOUS | Status: DC
  Administered 2013-12-31: 75 mL/h via INTRAVENOUS

## 2013-12-31 MED ORDER — DEXAMETHASONE SODIUM PHOSPHATE 4 MG/ML IJ SOLN
INTRAMUSCULAR | Status: DC | PRN
  Administered 2013-12-31: 10 mg via INTRAVENOUS

## 2013-12-31 MED ORDER — MIDAZOLAM HCL 2 MG/2ML IJ SOLN
INTRAMUSCULAR | Status: AC
  Filled 2013-12-31: qty 2

## 2013-12-31 MED ORDER — ROCURONIUM BROMIDE 100 MG/10ML IV SOLN
INTRAVENOUS | Status: AC
  Filled 2013-12-31: qty 1

## 2013-12-31 MED ORDER — IOHEXOL 300 MG/ML  SOLN
INTRAMUSCULAR | Status: DC | PRN
  Administered 2013-12-31: 1 mL via URETHRAL

## 2013-12-31 MED ORDER — ONDANSETRON HCL 4 MG PO TABS: 4.0000 mg | ORAL_TABLET | Freq: Three times a day (TID) | ORAL | Status: DC | PRN

## 2013-12-31 MED ORDER — LACTATED RINGERS IV SOLN
INTRAVENOUS | Status: DC
  Administered 2013-12-31: 1000 mL via INTRAVENOUS

## 2013-12-31 MED ORDER — PROPOFOL 10 MG/ML IV BOLUS
INTRAVENOUS | Status: AC
  Filled 2013-12-31: qty 20

## 2013-12-31 MED ORDER — KETOROLAC TROMETHAMINE 30 MG/ML IJ SOLN
INTRAMUSCULAR | Status: DC | PRN
  Administered 2013-12-31: 30 mg via INTRAVENOUS

## 2013-12-31 MED ORDER — HYDROMORPHONE HCL PF 1 MG/ML IJ SOLN: 0.2500 mg | INTRAMUSCULAR | Status: DC | PRN

## 2013-12-31 SURGICAL SUPPLY — 17 items
BAG URO CATCHER STRL LF (DRAPE) ×3 IMPLANT
BASKET LASER NITINOL 1.9FR (BASKET) ×3 IMPLANT
CATH INTERMIT  6FR 70CM (CATHETERS) ×3 IMPLANT
CLOTH BEACON ORANGE TIMEOUT ST (SAFETY) ×3 IMPLANT
DRAPE CAMERA CLOSED 9X96 (DRAPES) ×3 IMPLANT
GLOVE BIOGEL M STRL SZ7.5 (GLOVE) ×3 IMPLANT
GLOVE SURG SS PI 8.0 STRL IVOR (GLOVE) ×3 IMPLANT
GOWN STRL REUS W/ TWL XL LVL3 (GOWN DISPOSABLE) ×2 IMPLANT
GOWN STRL REUS W/TWL XL LVL3 (GOWN DISPOSABLE) ×7 IMPLANT
GUIDEWIRE STR DUAL SENSOR (WIRE) ×3 IMPLANT
MANIFOLD NEPTUNE II (INSTRUMENTS) ×3 IMPLANT
MARKER SKIN DUAL TIP RULER LAB (MISCELLANEOUS) ×3 IMPLANT
NS IRRIG 1000ML POUR BTL (IV SOLUTION) ×3 IMPLANT
PACK CYSTO (CUSTOM PROCEDURE TRAY) ×3 IMPLANT
SCRUB PCMX 4 OZ (MISCELLANEOUS) ×3 IMPLANT
TUBING CONNECTING 10 (TUBING) ×2 IMPLANT
TUBING CONNECTING 10' (TUBING) ×1

## 2013-12-31 NOTE — Progress Notes (Signed)
Patient ID: Phillip Shannon, male   DOB: 13-Dec-1950, 63 y.o.   MRN: [id number]    Subjective: Phillip Shannon has continued to have pain through the night and last required dilaudid at 5am.  He has mild discomfort now.   ROS:  Review of Systems  Gastrointestinal: Negative for nausea.  Genitourinary: Positive for flank pain.    Anti-infectives: Anti-infectives   None      Current Facility-Administered Medications  Medication Dose Route Frequency Provider Last Rate Last Dose  . acetaminophen (TYLENOL) tablet 650 mg  650 mg Oral Q4H PRN Anner CreteJohn J Ashonti Leandro, MD      . bisacodyl (DULCOLAX) suppository 10 mg  10 mg Rectal Daily PRN Anner CreteJohn J Mike Hamre, MD      . dextrose 5 % and 0.45 % NaCl with KCl 20 mEq/L infusion   Intravenous Continuous Anner CreteJohn J Joeangel Jeanpaul, MD 100 mL/hr at 12/30/13 2111    . docusate sodium (COLACE) capsule 100 mg  100 mg Oral BID Anner CreteJohn J Emiel Kielty, MD      . HYDROmorphone (DILAUDID) injection 0.5-1 mg  0.5-1 mg Intravenous Q2H PRN Anner CreteJohn J Buck Mcaffee, MD   1 mg at 12/31/13 0448  . oxyCODONE (Oxy IR/ROXICODONE) immediate release tablet 5 mg  5 mg Oral Q4H PRN Anner CreteJohn J Anjela Cassara, MD      . promethazine (PHENERGAN) injection 12.5 mg  12.5 mg Intravenous Q6H PRN Anner CreteJohn J Janece Laidlaw, MD   12.5 mg at 12/30/13 1931  . tamsulosin (FLOMAX) capsule 0.4 mg  0.4 mg Oral Daily Anner CreteJohn J Aliani Caccavale, MD   0.4 mg at 12/30/13 1921  . zolpidem (AMBIEN) tablet 5 mg  5 mg Oral QHS PRN,MR X 1 Anner CreteJohn J Jaykwon Morones, MD         Objective: Vital signs in last 24 hours: Temp:  [98.4 F (36.9 C)-98.9 F (37.2 C)] 98.4 F (36.9 C) (06/16 0513) Pulse Rate:  [65-82] 82 (06/16 0513) Resp:  [16-22] 20 (06/16 0513) BP: (142-172)/(72-95) 159/72 mmHg (06/16 0513) SpO2:  [91 %-96 %] 92 % (06/16 0513) Weight:  [120.4 kg (265 lb 6.9 oz)] 120.4 kg (265 lb 6.9 oz) (06/15 2022)  Intake/Output from previous day: 06/15 0701 - 06/16 0700 In: -  Out: 700 [Urine:525; Emesis/NG output:175] Intake/Output this shift:     Physical Exam  Constitutional: He is  well-developed, well-nourished, and in no distress.  Abdominal: Soft. There is tenderness (RLQ and RCVA).    Lab Results:   Recent Labs  12/28/13 2239 12/30/13 1342  WBC 7.1 9.9  HGB 14.7 13.9  HCT 42.1 39.8  PLT 227 202   BMET  Recent Labs  12/28/13 2239 12/30/13 1342  NA 143 141  K 3.8 4.4  CL 102 103  CO2 25 26  GLUCOSE 109* 128*  BUN 14 14  CREATININE 1.05 1.18  CALCIUM 9.9 9.2   PT/INR No results found for this basename: LABPROT, INR,  in the last 72 hours ABG No results found for this basename: PHART, PCO2, PO2, HCO3,  in the last 72 hours  Studies/Results: Dg Chest 2 View  12/30/2013   CLINICAL DATA:  Emesis and kidney disease  EXAM: CHEST  2 VIEW  COMPARISON:  None.  FINDINGS: Lungs are clear. Heart is mildly enlarged with normal pulmonary vascularity. No adenopathy. There is degenerative change in the thoracic spine.  IMPRESSION: No edema or consolidation.  Heart mildly enlarged.   Electronically Signed   By: Bretta BangWilliam  Woodruff M.D.   On: 12/30/2013 15:21   [place] Abdomen  Complete  12/30/2013   CLINICAL DATA:  63 year old male with abdominal pain, nausea and vomiting. History of urinary calculi.  EXAM: ULTRASOUND ABDOMEN COMPLETE  COMPARISON:  12/28/2013 and prior CTs.  FINDINGS: Gallbladder:  The gallbladder is unremarkable. There is no evidence of cholelithiasis or acute cholecystitis.  Common bile duct:  Diameter: 2.9 mm. There is no evidence of intrahepatic or extrahepatic biliary dilatation.  Liver:  No focal lesion identified. Within normal limits in parenchymal echogenicity.  IVC:  No abnormality visualized.  Pancreas:  Visualized portion unremarkable.  Spleen:  Size and appearance within normal limits.  Right Kidney:  Length: 12.1 cm. Increased renal echogenicity identified. A 3.5 x 4 cm mid renal cyst is present. There is no evidence of solid mass or hydronephrosis.  Left Kidney:  Length: 12.2 cm. Increased renal echogenicity identified. There is no evidence of  solid mass or hydronephrosis.  Abdominal aorta:  No aneurysm identified, but the mid and distal abdominal aorta are obscured by bowel gas.  Other findings:  None.  IMPRESSION: Increased renal echogenicity likely representing medical renal disease. No evidence of hydronephrosis.  Unremarkable gallbladder.  Mid and distal abdominal aorta not well visualized.   Electronically Signed   By: Laveda AbbeJeff  Hu M.D.   On: 12/30/2013 16:34     Assessment: He continues to have pain requiring narcotics and has not passed the stone.   Plan: He will be kept NPO and I will get him on the schedule today for right ureteroscopic stone extraction.  I reviewed the risks of bleeding, infection, ureteral injury, need for a stent, thrombotic events and anesthetic complications.      LOS: 1 day    Phillip Shannon J 12/31/2013

## 2013-12-31 NOTE — Discharge Instructions (Signed)
Kidney Stones  Kidney stones (urolithiasis) are deposits that form inside your kidneys. The intense pain is caused by the stone moving through the urinary tract. When the stone moves, the ureter goes into spasm around the stone. The stone is usually passed in the urine.   CAUSES   · A disorder that makes certain neck glands produce too much parathyroid hormone (primary hyperparathyroidism).  · A buildup of uric acid crystals, similar to gout in your joints.  · Narrowing (stricture) of the ureter.  · A kidney obstruction present at birth (congenital obstruction).  · Previous surgery on the kidney or ureters.  · Numerous kidney infections.  SYMPTOMS   · Feeling sick to your stomach (nauseous).  · Throwing up (vomiting).  · Blood in the urine (hematuria).  · Pain that usually spreads (radiates) to the groin.  · Frequency or urgency of urination.  DIAGNOSIS   · Taking a history and physical exam.  · Blood or urine tests.  · CT scan.  · Occasionally, an examination of the inside of the urinary bladder (cystoscopy) is performed.  TREATMENT   · Observation.  · Increasing your fluid intake.  · Extracorporeal shock wave lithotripsy This is a noninvasive procedure that uses shock waves to break up kidney stones.  · Surgery may be needed if you have severe pain or persistent obstruction. There are various surgical procedures. Most of the procedures are performed with the use of small instruments. Only small incisions are needed to accommodate these instruments, so recovery time is minimized.  The size, location, and chemical composition are all important variables that will determine the proper choice of action for you. Talk to your health care provider to better understand your situation so that you will minimize the risk of injury to yourself and your kidney.   HOME CARE INSTRUCTIONS   · Drink enough water and fluids to keep your urine clear or pale yellow. This will help you to pass the stone or stone fragments.  · Strain  all urine through the provided strainer. Keep all particulate matter and stones for your health care provider to see. The stone causing the pain may be as small as a grain of salt. It is very important to use the strainer each and every time you pass your urine. The collection of your stone will allow your health care provider to analyze it and verify that a stone has actually passed. The stone analysis will often identify what you can do to reduce the incidence of recurrences.  · Only take over-the-counter or prescription medicines for pain, discomfort, or fever as directed by your health care provider.  · Make a follow-up appointment with your health care provider as directed.  · Get follow-up X-rays if required. The absence of pain does not always mean that the stone has passed. It may have only stopped moving. If the urine remains completely obstructed, it can cause loss of kidney function or even complete destruction of the kidney. It is your responsibility to make sure X-rays and follow-ups are completed. Ultrasounds of the kidney can show blockages and the status of the kidney. Ultrasounds are not associated with any radiation and can be performed easily in a matter of minutes.  SEEK MEDICAL CARE IF:  · You experience pain that is progressive and unresponsive to any pain medicine you have been prescribed.  SEEK IMMEDIATE MEDICAL CARE IF:   · Pain cannot be controlled with the prescribed medicine.  · You have a fever   or shaking chills.  · The severity or intensity of pain increases over 18 hours and is not relieved by pain medicine.  · You develop a new onset of abdominal pain.  · You feel faint or pass out.  · You are unable to urinate.  MAKE SURE YOU:   · Understand these instructions.  · Will watch your condition.  · Will get help right away if you are not doing well or get worse.  Document Released: 07/04/2005 Document Revised: 03/06/2013 Document Reviewed: 12/05/2012  [employer]® Patient Information ©2014  [employer].

## 2013-12-31 NOTE — Anesthesia Preprocedure Evaluation (Addendum)
Anesthesia Evaluation  Patient identified by MRN, date of birth, ID band Patient awake    Reviewed: Allergy & Precautions, H&P , NPO status , Patient's Chart, lab work & pertinent test results  History of Anesthesia Complications (+) PONV  Airway Mallampati: III TM Distance: >3 FB Neck ROM: Full    Dental  (+) Missing, Poor Dentition, Dental Advisory Given   Pulmonary neg pulmonary ROS,  breath sounds clear to auscultation  Pulmonary exam normal       Cardiovascular negative cardio ROS  Rhythm:Regular Rate:Normal     Neuro/Psych negative neurological ROS  negative psych ROS   GI/Hepatic negative GI ROS, Neg liver ROS,   Endo/Other  Morbid obesity  Renal/GU Renal disease  negative genitourinary   Musculoskeletal negative musculoskeletal ROS (+)   Abdominal   Peds  Hematology negative hematology ROS (+)   Anesthesia Other Findings   Reproductive/Obstetrics                          Anesthesia Physical Anesthesia Plan  ASA: II  Anesthesia Plan: General   Post-op Pain Management:    Induction: Intravenous  Airway Management Planned: LMA  Additional Equipment:   Intra-op Plan:   Post-operative Plan: Extubation in OR  Informed Consent: I have reviewed the patients History and Physical, chart, labs and discussed the procedure including the risks, benefits and alternatives for the proposed anesthesia with the patient or authorized representative who has indicated his/her understanding and acceptance.   Dental advisory given  Plan Discussed with: CRNA  Anesthesia Plan Comments:         Anesthesia Quick Evaluation

## 2013-12-31 NOTE — Anesthesia Postprocedure Evaluation (Signed)
Anesthesia Post Note  Patient: Phillip Shannon  Procedure(s) Performed: Procedure(s) (LRB): CYSTOSCOPY WITH RETROGRADE PYELOGRAM, URETEROSCOPY AND STONE REMOVAL (Right)  Anesthesia type: General  Patient location: PACU  Post pain: Pain level controlled  Post assessment: Post-op Vital signs reviewed  Last Vitals:  Filed Vitals:   12/31/13 1406  BP: 129/63  Pulse: 62  Temp: 36.6 C  Resp: 14    Post vital signs: Reviewed  Level of consciousness: sedated  Complications: No apparent anesthesia complications

## 2013-12-31 NOTE — Progress Notes (Signed)
UR Completed.  Phillip Shannon, Phillip Shannon [phone] 12/31/2013  

## 2013-12-31 NOTE — Discharge Summary (Signed)
Physician Discharge Summary  Patient ID: Phillip Shannon MRN: [id number] DOB/AGE: 03/29/1951 63 y.o.  Admit date: 12/30/2013 Discharge date: 12/31/2013  Admission Diagnoses: Renal colic on right side [788.0] Emesis, persistent [536.2]  Discharge Diagnoses:  Active Problems:   Ureteral stone   Discharged Condition: improved  Hospital Course:   Surgery for stone removal December 31, 2013  Consults: none  Significant Diagnostic Studies: Dg Chest 2 View  12/30/2013   CLINICAL DATA:  Emesis and kidney disease  EXAM: CHEST  2 VIEW  COMPARISON:  None.  FINDINGS: Lungs are clear. Heart is mildly enlarged with normal pulmonary vascularity. No adenopathy. There is degenerative change in the thoracic spine.  IMPRESSION: No edema or consolidation.  Heart mildly enlarged.   Electronically Signed   By: Bretta BangWilliam  Woodruff M.D.   On: 12/30/2013 15:21   [place] Abdomen Complete  12/30/2013   CLINICAL DATA:  63 year old male with abdominal pain, nausea and vomiting. History of urinary calculi.  EXAM: ULTRASOUND ABDOMEN COMPLETE  COMPARISON:  12/28/2013 and prior CTs.  FINDINGS: Gallbladder:  The gallbladder is unremarkable. There is no evidence of cholelithiasis or acute cholecystitis.  Common bile duct:  Diameter: 2.9 mm. There is no evidence of intrahepatic or extrahepatic biliary dilatation.  Liver:  No focal lesion identified. Within normal limits in parenchymal echogenicity.  IVC:  No abnormality visualized.  Pancreas:  Visualized portion unremarkable.  Spleen:  Size and appearance within normal limits.  Right Kidney:  Length: 12.1 cm. Increased renal echogenicity identified. A 3.5 x 4 cm mid renal cyst is present. There is no evidence of solid mass or hydronephrosis.  Left Kidney:  Length: 12.2 cm. Increased renal echogenicity identified. There is no evidence of solid mass or hydronephrosis.  Abdominal aorta:  No aneurysm identified, but the mid and distal abdominal aorta are obscured by bowel gas.  Other  findings:  None.  IMPRESSION: Increased renal echogenicity likely representing medical renal disease. No evidence of hydronephrosis.  Unremarkable gallbladder.  Mid and distal abdominal aorta not well visualized.   Electronically Signed   By: Laveda AbbeJeff  Hu M.D.   On: 12/30/2013 16:34    Treatments: IV hydration  Discharge Exam: Blood pressure 159/72, pulse 82, temperature 98.4 F (36.9 C), temperature source Oral, resp. rate 20, height 5\' 10"  (1.778 m), weight 120.4 kg (265 lb 6.9 oz), SpO2 92.00%. General appearance: alert and cooperative  Disposition: 01-Home or Self Care  Discharge Instructions   Discharge patient    Complete by:  As directed      Discontinue IV    Complete by:  As directed             Medication List         naproxen sodium 220 MG tablet  Commonly known as:  ANAPROX  Take 220-440 mg by mouth daily as needed (for pain).     ondansetron 4 MG tablet  Commonly known as:  ZOFRAN  Take 1 tablet (4 mg total) by mouth every 8 (eight) hours as needed for nausea (may repeat dose in 1 hr if no improvement).     oxyCODONE-acetaminophen 5-325 MG per tablet  Commonly known as:  PERCOCET  Take 1 tablet by mouth every 4 (four) hours as needed for severe pain.     oxyCODONE-acetaminophen 5-325 MG per tablet  Commonly known as:  ROXICET  Take 1 tablet by mouth every 4 (four) hours as needed for severe pain.     phenazopyridine 200 MG tablet  Commonly known as:  PYRIDIUM  Take 1 tablet (200 mg total) by mouth 3 (three) times daily as needed for pain.     trimethoprim 100 MG tablet  Commonly known as:  TRIMPEX  Take 1 tablet (100 mg total) by mouth 1 day or 1 dose.           Follow-up Information   [facility]. Schedule an appointment as soon as possible for a visit in 3 weeks. (revie kidney stone analysis)    Specialty:  Urology   Contact information:   [address] [place] [address] [phone]      RTC : Dr. Isabel CapriceGrapey to review  stone analysis.  SignedJethro Bolus: TANNENBAUM, SIGMUND I 12/31/2013, 12:55 PM

## 2013-12-31 NOTE — Progress Notes (Signed)
Urology Progress Note  Day of Surgery   Subjective: continued pain from impacted R ureteral stone.      No acute urologic events overnight. Ambulation:   negative Flatus:    positive Bowel movement  negative  Pain: no relief  Objective:  Blood pressure 159/72, pulse 82, temperature 98.4 F (36.9 C), temperature source Oral, resp. rate 20, height 5\' 10"  (1.778 m), weight 120.4 kg (265 lb 6.9 oz), SpO2 92.00%.  Physical Exam:  General:  No acute distress, awake  Genitourinary:  s-p pain. Neg flank pain.  Foley: none    I/O last 3 completed shifts: In: 981.7 [I.V.:981.7] Out: 700 [Urine:525; Emesis/NG output:175]  Recent Labs     12/28/13  2239  12/30/13  1342  HGB  14.7  13.9  WBC  7.1  9.9  PLT  227  202    Recent Labs     12/28/13  2239  12/30/13  1342  NA  143  141  K  3.8  4.4  CL  102  103  CO2  25  26  BUN  14  14  CREATININE  1.05  1.18  CALCIUM  9.9  9.2  GFRNONAA  74*  64*  GFRAA  85*  74*     No results found for this basename: PT, INR, APTT,  in the last 72 hours   No components found with this basename: ABG,   Assessment/Plan:   For surgery today for removal of stone.

## 2013-12-31 NOTE — H&P (Signed)
Urology Admission H&P Cc: dr. Barron Alvine  Chief Complaint:   Right flank pain  History of Present Illness:   [name] is a 63 yo married AA male patient of Dr. Isabel Caprice,  with a history of kidney stones, last seen by Dr. Isabel Caprice January 2014, post L renal lithotripsy for calcium oxalate stone.    Currently, he was seen by Dr. Annabell Howells as emergency consultation  by Dr. Elesa Massed for intractable pain with a 3.56mm right UVJ stone, and gross hematuria. .    He had the onset of the pain on Saturday ( 4 days ago),  and was seen in the [facility] when the CT was done. He was treated with medical expulsive therapy with tamsulosin , but has been unable to pass the stone, ane returned today with increased pain in the RLQ, R flank. He had Toradol  from Dr. Maurice Small with some relief. However, he developed recurrent pain with nausea and vomiting, and returned to the ER with some relief some relief with dilaudid and phenergan. He had some crackers and ginger ale overnight. He has had no urgency, or stranguria    CT shows additional  6mm LLP stone. He drinks multiple Pepsi Colas/day. He does not drink much water, and gets dehydrated while working in his garden during the daytime. He eats multiple fast food meals also. He has a filter on his home faucet.  ROS:  Review of Systems  Constitutional: Positive for chills and diaphoresis. Negative for fever.  Cardiovascular: Positive for chest pain (with radiation into the epigastric region. ).  Gastrointestinal: Positive for nausea, vomiting and abdominal pain.  Genitourinary: Positive for hematuria.  All other systems reviewed and are negative.   Allergies   Allergen  Reactions   .  Codeine  Nausea And Vomiting      Past Medical History  Diagnosis Date  . Kidney stone   . PONV (postoperative nausea and vomiting)    Past Surgical History  Procedure Laterality Date  . Back surgery    . Lithotripsy      Home Medications:  Prescriptions prior to admission   Medication Sig Dispense Refill  . naproxen sodium (ANAPROX) 220 MG tablet Take 220-440 mg by mouth daily as needed (for pain).      Marland Kitchen oxyCODONE-acetaminophen (PERCOCET) 5-325 MG per tablet Take 1 tablet by mouth every 4 (four) hours as needed for severe pain.  20 tablet  0   Allergies:  Allergies  Allergen Reactions  . Codeine Nausea And Vomiting    History reviewed. No pertinent family history. Social History:  reports that he has never smoked. He has never used smokeless tobacco. He reports that he does not drink alcohol or use illicit drugs.  Review of Systems  Constitutional: Positive for malaise/fatigue. Negative for fever, chills and weight loss.  HENT: Negative.   Eyes: Negative.   Respiratory: Negative.   Cardiovascular: Negative.   Gastrointestinal: Positive for nausea and vomiting. Negative for heartburn, abdominal pain, diarrhea, constipation, blood in stool and melena.  Genitourinary: Positive for hematuria and flank pain. Negative for dysuria, urgency and frequency.  Musculoskeletal: Negative for back pain, falls, joint pain, myalgias and neck pain.  Skin: Negative.   Neurological: Positive for weakness.  Endo/Heme/Allergies: Negative.   Psychiatric/Behavioral: Negative.   : multiple Pepsi/day.  + nausea and vomiting.   Physical Exam:  Vital signs in last 24 hours: Temp:  [98.4 F (36.9 C)-98.9 F (37.2 C)] 98.4 F (36.9 C) (06/16 0513) Pulse Rate:  [  65-82] 82 (06/16 0513) Resp:  [16-22] 20 (06/16 0513) BP: (142-172)/(72-95) 159/72 mmHg (06/16 0513) SpO2:  [91 %-96 %] 92 % (06/16 0513) Weight:  [120.4 kg (265 lb 6.9 oz)] 120.4 kg (265 lb 6.9 oz) (06/15 2022) Physical Exam  Constitutional: He appears well-developed and well-nourished. No distress.  HENT:  Head: Normocephalic.  Eyes: EOM are normal. Pupils are equal, round, and reactive to light.  Neck: Normal range of motion.  Cardiovascular: Normal rate.   Respiratory: Effort normal.  GI: Soft. Normal  appearance and bowel sounds are normal. He exhibits no mass. There is no tenderness. There is CVA tenderness. There is no rebound and no guarding.  Genitourinary: Penis normal.  Musculoskeletal: Normal range of motion. He exhibits no edema.  Neurological: He is alert.  Skin: He is not diaphoretic.    Laboratory Data:  Results for orders placed during the hospital encounter of 12/30/13 (from the past 24 hour(s))  CBC WITH DIFFERENTIAL     Status: Abnormal   Collection Time    12/30/13  1:42 PM      Result Value Ref Range   WBC 9.9  4.0 - 10.5 K/uL   RBC 4.38  4.22 - 5.81 MIL/uL   Hemoglobin 13.9  13.0 - 17.0 g/dL   HCT 29.539.8  28.439.0 - 13.252.0 %   MCV 90.9  78.0 - 100.0 fL   MCH 31.7  26.0 - 34.0 pg   MCHC 34.9  30.0 - 36.0 g/dL   RDW 44.013.8  10.211.5 - 72.515.5 %   Platelets 202  150 - 400 K/uL   Neutrophils Relative % 82 (*) 43 - 77 %   Neutro Abs 8.1 (*) 1.7 - 7.7 K/uL   Lymphocytes Relative 13  12 - 46 %   Lymphs Abs 1.3  0.7 - 4.0 K/uL   Monocytes Relative 5  3 - 12 %   Monocytes Absolute 0.5  0.1 - 1.0 K/uL   Eosinophils Relative 0  0 - 5 %   Eosinophils Absolute 0.0  0.0 - 0.7 K/uL   Basophils Relative 0  0 - 1 %   Basophils Absolute 0.0  0.0 - 0.1 K/uL  COMPREHENSIVE METABOLIC PANEL     Status: Abnormal   Collection Time    12/30/13  1:42 PM      Result Value Ref Range   Sodium 141  137 - 147 mEq/L   Potassium 4.4  3.7 - 5.3 mEq/L   Chloride 103  96 - 112 mEq/L   CO2 26  19 - 32 mEq/L   Glucose, Bld 128 (*) 70 - 99 mg/dL   BUN 14  6 - 23 mg/dL   Creatinine, Ser 3.661.18  0.50 - 1.35 mg/dL   Calcium 9.2  8.4 - 44.010.5 mg/dL   Total Protein 7.5  6.0 - 8.3 g/dL   Albumin 4.0  3.5 - 5.2 g/dL   AST 22  0 - 37 U/L   ALT 15  0 - 53 U/L   Alkaline Phosphatase 54  39 - 117 U/L   Total Bilirubin 0.9  0.3 - 1.2 mg/dL   GFR calc non Af Amer 64 (*) >90 mL/min   GFR calc Af Amer 74 (*) >90 mL/min  LIPASE, BLOOD     Status: None   Collection Time    12/30/13  1:42 PM      Result Value Ref  Range   Lipase 23  11 - 59 U/L  Rosezena SensorI-STAT TROPOININ, ED  Status: None   Collection Time    12/30/13  1:52 PM      Result Value Ref Range   Troponin i, poc 0.00  0.00 - 0.08 ng/mL   Comment 3           URINALYSIS, ROUTINE W REFLEX MICROSCOPIC     Status: Abnormal   Collection Time    12/30/13  3:38 PM      Result Value Ref Range   Color, Urine YELLOW  YELLOW   APPearance CLEAR  CLEAR   Specific Gravity, Urine 1.025  1.005 - 1.030   pH 6.5  5.0 - 8.0   Glucose, UA NEGATIVE  NEGATIVE mg/dL   Hgb urine dipstick TRACE (*) NEGATIVE   Bilirubin Urine NEGATIVE  NEGATIVE   Ketones, ur NEGATIVE  NEGATIVE mg/dL   Protein, ur NEGATIVE  NEGATIVE mg/dL   Urobilinogen, UA 1.0  0.0 - 1.0 mg/dL   Nitrite NEGATIVE  NEGATIVE   Leukocytes, UA NEGATIVE  NEGATIVE  URINE MICROSCOPIC-ADD ON     Status: None   Collection Time    12/30/13  3:38 PM      Result Value Ref Range   Squamous Epithelial / LPF RARE  RARE   WBC, UA 0-2  <3 WBC/hpf   RBC / HPF 7-10  <3 RBC/hpf   Bacteria, UA RARE  RARE   Urine-Other MUCOUS PRESENT     No results found for this or any previous visit (from the past 240 hour(s)). Creatinine:  Recent Labs  12/28/13 2239 12/30/13 1342  CREATININE 1.05 1.18     Impression/Assessment:    3.383mm R u-v junction stone, impacted. He wil require basket extraction  Plan:    For OR today.   TANNENBAUM, SIGMUND I 12/31/2013, 9:04 AM

## 2013-12-31 NOTE — Transfer of Care (Signed)
Immediate Anesthesia Transfer of Care Note  Patient: Phillip Shannon  Procedure(s) Performed: Procedure(s): CYSTOSCOPY WITH RETROGRADE PYELOGRAM, URETEROSCOPY AND STONE REMOVAL (Right)  Patient Location: PACU  Anesthesia Type:General  Level of Consciousness: awake and oriented  Airway & Oxygen Therapy: Patient Spontanous Breathing and Patient connected to face mask oxygen  Post-op Assessment: Report given to PACU RN and Post -op Vital signs reviewed and stable  Post vital signs: Reviewed and stable  Complications: No apparent anesthesia complications

## 2013-12-31 NOTE — Interval H&P Note (Signed)
History and Physical Interval Note:  12/31/2013 11:57 AM  Phillip Shannon  has presented today for surgery, with the diagnosis of right distal stone  The various methods of treatment have been discussed with the patient and family. After consideration of risks, benefits and other options for treatment, the patient has consented to  Procedure(s): CYSTOSCOPY WITH RETROGRADE PYELOGRAM, URETEROSCOPY AND STENT PLACEMENT, STONE REMOVAL (Right) HOLMIUM LASER APPLICATION (Right) as a surgical intervention .  The patient's history has been reviewed, patient examined, no change in status, stable for surgery.  I have reviewed the patient's chart and labs.  Questions were answered to the patient's satisfaction.     Jethro BolusANNENBAUM, SIGMUND I

## 2013-12-31 NOTE — Op Note (Signed)
Pre-operative diagnosis :   Impacted 3.3 mm Right ureterovesical junction stone  Postoperative diagnosis:  Same  Operation:  Cystourethroscopy, right retrograde PyeloGram with  interpretation, right distal ureteroscopy, basket extraction of 3.3 mm multifaceted impacted right ureterovesical junction calculus  Surgeon:  S. Patsi Searsannenbaum, MD  First assistant:  none  Anesthesia:  General LMA  Preparation:  After appropriate preanesthesia, the patient was brought the operative room, placed on the operating table the dorsal supine position where general LMA anesthesia was introduced. He was replaced in the dorsal lithotomy position, where the pubis was prepped with Betadine solution, and draped in usual fashion. The Endo suppository was placed. The armband was double checked. The right arm was previously marked. The history was reviewed. The x-rays were reviewed as well.  Review history:  History of Present Illness: Phillip Shannon is a 63 yo married AA male patient of Dr. Isabel CapriceGrapey, with a history of kidney stones, last seen by Dr. Isabel CapriceGrapey January 2014, post L renal lithotripsy for calcium oxalate stone.  Currently, he was seen by Dr. Annabell HowellsWrenn as emergency consultation by Dr. Elesa MassedWard for intractable pain with a 3.123mm right UVJ stone, and gross hematuria. .  He had the onset of the pain on Saturday ( 4 days ago), [facility]. He was treated with medical expulsive therapy with tamsulosin , but has been unable to pass the stone, ane returned today with increased pain in the RLQ, R flank. He had Toradol from Dr. Maurice SmallElaine Griffin with some relief. However, he developed recurrent pain with nausea and vomiting, and returned to the ER with some relief some relief with dilaudid and phenergan. He had some crackers and ginger ale overnight. He has had no urgency, or stranguria  CT shows additional 6mm LLP stone. He drinks multiple Pepsi Colas/day. He does not drink much water, and gets  dehydrated while working in his garden during the daytime. He eats multiple fast food meals also. He has a filter on his home faucet.     Statement of  Likelihood of Success: Excellent. 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 The bladder neck was normal. The trigone was identified, and the left hemitrigone was normal. The right hemitrigone, however, had a grossly edematous right ureteral orifice. There was no trabeculation or cellule formation. There was no bladder stone, tumor, or diverticular formation noted.  Using a 6 JamaicaFrench open-ended catheter, the right ureteral orifice was cannulated. Slow insertion of x-ray contrast material under fluoroscopic control was accomplished, and the impacted right ureterovesical junction stone was identified. A 0.038 sensor wire was then passed around the stone, into the kidney under fluoroscopic control. The short 6.5 French ureteroscope was then passed through the urethra, and alongside the guidewire, into the ureteral tunnel. At the level of the ureterovesical junction, a multifaceted 3.3 mm stone was identified. The stone was kidney-shaped, and flat. The was "jack-stone" appearance on the stone surface.   Using an escape basket, the stone was engaged, and removed without trauma. Repeat ureteroscopy showed no stone fragments within the ureter. There was no trauma to the ureter. I therefore elected not to leave a double-J stent. The patient received 1 g of IV Tylenol, and IV Toradol.  The bladder was drained of fluid, and Xylocaine jelly was placed urethra. He was awakened, and taken to recovery room in good condition.

## 2013-12-31 NOTE — Progress Notes (Signed)
Dr. Rica MastFortune in- saw EKG readings- sinus rhythmn with PACS- aware of blood pressures-and that patient is discharged to go home today.

## 2014-01-01 ENCOUNTER — Encounter (HOSPITAL_COMMUNITY): Payer: Self-pay | Admitting: Urology

## 2014-02-24 ENCOUNTER — Other Ambulatory Visit: Payer: Self-pay | Admitting: Urology

## 2014-02-25 ENCOUNTER — Encounter (HOSPITAL_COMMUNITY): Payer: Self-pay | Admitting: *Deleted

## 2014-02-26 ENCOUNTER — Encounter (HOSPITAL_COMMUNITY): Payer: Self-pay | Admitting: Pharmacy Technician

## 2014-02-27 NOTE — H&P (Signed)
History of Present Illness   Phillip Shannon returns today for a followup. I saw him and treated him for an 11 mm stone in his left kidney back in 2013. He was felt to have a 2 mm fragment left in the lower pole. He never came back for any followup. Several weeks ago he developed severe right renal colic and was diagnosed with a 3 mm distal right ureteral stone. He was managed on call by Dr Patsi Sears with a ureteroscopy. He was subsequently discharged and feels totally back to normal and is doing extremely well at this time. On recent CT imaging the patient did however have evidence of a 5-6 mm nonobstructing stone in the lower pole of his left kidney. I have reviewed his KUB and imaging studies. We have gone back and looked at his imaging over the last couple of years. I have also reviewed his recent hospital records.     Past Medical History Problems  1. History of Arthritis (V13.4)  Surgical History Problems  1. History of Back Surgery 2. History of Cystoscopy With Ureteroscopy With Removal Of Calculus 3. History of Lithotripsy  Allergies Medication  1. No Known Drug Allergies  Family History Problems  1. Family history of Alcohol Abuse : Father 2. Family history of Death In The Family Father : Father   10yrs 3. Family history of Family Health Status - Mother's Age : Mother   27yrs 4. Family history of Family Health Status Number Of Children   1 son and 1 daughter  Social History Problems  1. Denied: History of Alcohol Use 2. Caffeine Use   2 qd 3. Marital History - Currently Married 4. Never A Smoker 5. Occupation:   truck [employer] doctor 6. Denied: History of Tobacco Use  Review of Systems Genitourinary, constitutional, skin, eye, otolaryngeal, hematologic/lymphatic, cardiovascular, pulmonary, endocrine, musculoskeletal, gastrointestinal, neurological and psychiatric system(s) were reviewed and pertinent findings if present are noted.  Genitourinary: nocturia, but no  hematuria.  Gastrointestinal: constipation, but no flank pain and no abdominal pain.  Musculoskeletal: back pain.    Vitals   Blood Pressure: 132 / 92 Temperature: 98.3 F Heart Rate: 72  Physical Exam Constitutional: Well nourished and well developed . No acute distress.  Pulmonary: No respiratory distress and normal respiratory rhythm and effort.  Cardiovascular: Heart rate and rhythm are normal . No peripheral edema.  Abdomen: The abdomen is soft and nontender. No masses are palpated. No CVA tenderness. No hernias are palpable. No hepatosplenomegaly noted.    Results/Data Urine [Data Includes: Last 1 Day]   07Jul2015 COLOR YELLOW  APPEARANCE CLEAR  SPECIFIC GRAVITY 1.025  pH 6.0  GLUCOSE NEG mg/dL BILIRUBIN NEG  KETONE NEG mg/dL BLOOD TRACE  PROTEIN NEG mg/dL UROBILINOGEN 0.2 mg/dL NITRITE NEG  LEUKOCYTE ESTERASE NEG  SQUAMOUS EPITHELIAL/HPF RARE  WBC 0-2 WBC/hpf RBC 0-2 RBC/hpf BACTERIA RARE  CRYSTALS NONE SEEN  CASTS NONE SEEN  Other MUCUS NOTED   Assessment Assessed  1. Nephrolithiasis (592.0)  Plan Nephrolithiasis  1. KUB; Status:Hold For - Appointment,Date of Service; Requested for:07Jul2015;  2. Follow-up Month x 6 Office  Follow-up  Status: Hold For - Appointment,Date of Service   Requested for: 07Jul2015  Discussion/Summary   Phillip Shannon has done well with his most recent ureteroscopy. He appears to have recovered well from that. On recent KUB imaging, his only remaining stone is about a 6 mm stone in the lower pole of his left kidney. He is told at this point one option would be  to go ahead with an elective ESWL. His other option would be to monitor the stone. He is told it is difficult/impossible to determine if this stone will ever be clinically problematic for him or not. He is told that this stone is about twice as large as the most recent stone that he difficulty passing. He is interested in considering elective ESWL, but wants to think about things.  I will tentatively put him down for a followup in 6 months, but he decides he would like to go ahead with ESWL, we will set that up for him and he just needs to contact me. He does report normal PSA testing recently and really does not appear to have any other urologic issues at this time.

## 2014-03-03 ENCOUNTER — Ambulatory Visit (HOSPITAL_COMMUNITY)
Admission: RE | Admit: 2014-03-03 | Discharge: 2014-03-03 | Disposition: A | Discharge status: Home / Self Care | Payer: 59 | Source: Ambulatory Visit | Attending: Urology | Admitting: Urology

## 2014-03-03 ENCOUNTER — Encounter (HOSPITAL_COMMUNITY): Payer: Self-pay | Admitting: General Practice

## 2014-03-03 ENCOUNTER — Encounter (HOSPITAL_COMMUNITY)
Admission: RE | Disposition: A | Discharge status: Home / Self Care | Payer: Self-pay | Source: Ambulatory Visit | Attending: Urology

## 2014-03-03 ENCOUNTER — Ambulatory Visit (HOSPITAL_COMMUNITY): Payer: 59

## 2014-03-03 DIAGNOSIS — N200 Calculus of kidney: Secondary | ICD-10-CM

## 2014-03-03 DIAGNOSIS — Z885 Allergy status to narcotic agent status: Secondary | ICD-10-CM | POA: Diagnosis not present

## 2014-03-03 HISTORY — DX: Unspecified osteoarthritis, unspecified site: M19.90

## 2014-03-03 SURGERY — LITHOTRIPSY, ESWL
Anesthesia: LOCAL | Laterality: Left

## 2014-03-03 MED ORDER — ONDANSETRON HCL 4 MG PO TABS: 4.0000 mg | ORAL_TABLET | Freq: Three times a day (TID) | ORAL | Status: DC | PRN

## 2014-03-03 MED ORDER — DIAZEPAM 5 MG PO TABS
10.0000 mg | ORAL_TABLET | ORAL | Status: AC
  Administered 2014-03-03: 10 mg via ORAL
  Filled 2014-03-03: qty 2

## 2014-03-03 MED ORDER — DEXTROSE-NACL 5-0.45 % IV SOLN
INTRAVENOUS | Status: DC
  Administered 2014-03-03: 07:00:00 via INTRAVENOUS

## 2014-03-03 MED ORDER — CIPROFLOXACIN HCL 500 MG PO TABS
500.0000 mg | ORAL_TABLET | ORAL | Status: AC
  Administered 2014-03-03: 500 mg via ORAL
  Filled 2014-03-03: qty 1

## 2014-03-03 MED ORDER — DIPHENHYDRAMINE HCL 25 MG PO CAPS
25.0000 mg | ORAL_CAPSULE | ORAL | Status: AC
  Administered 2014-03-03: 25 mg via ORAL
  Filled 2014-03-03: qty 1

## 2014-03-03 MED ORDER — HYDROCODONE-ACETAMINOPHEN 5-325 MG PO TABS: 1.0000 | ORAL_TABLET | Freq: Four times a day (QID) | ORAL | Status: DC | PRN

## 2014-03-03 NOTE — Op Note (Signed)
See [facility] OP note scanned into chart. 

## 2014-03-03 NOTE — Interval H&P Note (Signed)
History and Physical Interval Note:  03/03/2014 8:10 AM  Phillip Shannon  has presented today for surgery, with the diagnosis of Left Renal Calculus  The various methods of treatment have been discussed with the patient and family. After consideration of risks, benefits and other options for treatment, the patient has consented to  Procedure(s): LEFT EXTRACORPOREAL SHOCK WAVE LITHOTRIPSY (ESWL) (Left) as a surgical intervention .  The patient's history has been reviewed, patient examined, no change in status, stable for surgery.  I have reviewed the patient's chart and labs.  Questions were answered to the patient's satisfaction.     [name] S

## 2014-03-03 NOTE — Discharge Instructions (Signed)
See [facility] discharge instructions in chart.  May re-start naproxen in 3 days

## 2014-06-18 ENCOUNTER — Ambulatory Visit (INDEPENDENT_AMBULATORY_CARE_PROVIDER_SITE_OTHER): Payer: Self-pay | Admitting: Family Medicine

## 2014-06-18 VITALS — BP 124/70 | HR 70 | Temp 97.9°F | Resp 16 | Ht 69.75 in | Wt 255.4 lb

## 2014-06-18 DIAGNOSIS — Z0000 Encounter for general adult medical examination without abnormal findings: Secondary | ICD-10-CM

## 2014-06-18 NOTE — Progress Notes (Signed)
This chart was scribed for Elvina SidleKurt Lauenstein, MD by Tonye RoyaltyJoshua Chen, ED Scribe.    Patient ID: Phillip Shannon MRN: [id number], DOB: 25-Dec-1950, 63 y.o. Date of Encounter: 06/18/2014, 1:18 PM  Primary Physician: Lupe CarneyMITCHELL,DEAN, MD  Chief Complaint: DOT physical  HPI: 63 y.o. year old male with history below presents for DOT physical exam. 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  Past Medical History  Diagnosis Date   Kidney stone    PONV (postoperative nausea and vomiting)    Arthritis      Home Meds: Prior to Admission medications   Medication Sig Start Date End Date Taking? Authorizing Provider  HYDROcodone-acetaminophen (NORCO/VICODIN) 5-325 MG per tablet Take 1-2 tablets by mouth every 6 (six) hours as needed. Patient not taking: Reported on 06/18/2014 03/03/14   Valetta Fulleravid S Grapey, MD  ondansetron (ZOFRAN) 4 MG tablet Take 1 tablet (4 mg total) by mouth every 8 (eight) hours as needed for nausea or vomiting. Patient not taking: Reported on 06/18/2014 03/03/14   Valetta Fulleravid S Grapey, MD    Allergies:  Allergies  Allergen Reactions   Codeine Nausea And Vomiting    History   Social History   Marital Status: Married    Spouse Name: N/A    Number of Children: N/A   Years of Education: N/A   Occupational History   Not on file.   Social History Main Topics   Smoking status: Never Smoker    Smokeless tobacco: Never Used   Alcohol Use: No   Drug Use: No   Sexual Activity: Not on file   Other Topics Concern   Not on file   Social History Narrative     Review of Systems: Constitutional: negative for chills, fever, night sweats, weight changes, or fatigue  HEENT: negative for vision changes, hearing loss, congestion, rhinorrhea, ST, epistaxis, or sinus pressure Cardiovascular: negative for chest pain or palpitations Respiratory: negative for hemoptysis, wheezing, shortness of breath, or cough Abdominal:  negative for abdominal pain, nausea, vomiting, diarrhea, or constipation Dermatological: negative for rash Neurologic: negative for headache, dizziness, or syncope All other systems reviewed and are otherwise negative with the exception to those above and in the HPI.   Physical Exam: Blood pressure 124/70, pulse 70, temperature 97.9 F (36.6 C), temperature source Oral, resp. rate 16, height 5' 9.75" (1.772 m), weight 255 lb 6.4 oz (115.849 kg), SpO2 96 %., Body mass index is 36.89 kg/(m^2). See DOT paperwork    Visual Acuity Screening   Right eye Left eye Both eyes  Without correction:     With correction: 20/20 20/20 20/20   Comments: 8/8 COLOR 85% PERIPHERAL BILATERAL  Hearing Screening Comments: WHISPER TEST 10 FEET BILATERAL  Labs: Results for orders placed or performed during the hospital encounter of 12/30/13  Surgical pcr screen  Result Value Ref Range   MRSA, PCR NEGATIVE NEGATIVE   Staphylococcus aureus NEGATIVE NEGATIVE  CBC with Differential  Result Value Ref Range   WBC 9.9 4.0 - 10.5 K/uL   RBC 4.38 4.22 - 5.81 MIL/uL   Hemoglobin 13.9 13.0 - 17.0 g/dL   HCT 47.439.8 25.939.0 - 56.352.0 %   MCV 90.9 78.0 - 100.0 fL   MCH 31.7 26.0 - 34.0 pg   MCHC 34.9 30.0 - 36.0 g/dL   RDW 87.513.8 64.311.5 - 32.915.5 %   Platelets 202 150 - 400 K/uL   Neutrophils Relative % 82 (H) 43 - 77 %  Neutro Abs 8.1 (H) 1.7 - 7.7 K/uL   Lymphocytes Relative 13 12 - 46 %   Lymphs Abs 1.3 0.7 - 4.0 K/uL   Monocytes Relative 5 3 - 12 %   Monocytes Absolute 0.5 0.1 - 1.0 K/uL   Eosinophils Relative 0 0 - 5 %   Eosinophils Absolute 0.0 0.0 - 0.7 K/uL   Basophils Relative 0 0 - 1 %   Basophils Absolute 0.0 0.0 - 0.1 K/uL  Comprehensive metabolic panel  Result Value Ref Range   Sodium 141 137 - 147 mEq/L   Potassium 4.4 3.7 - 5.3 mEq/L   Chloride 103 96 - 112 mEq/L   CO2 26 19 - 32 mEq/L   Glucose, Bld 128 (H) 70 - 99 mg/dL   BUN 14 6 - 23 mg/dL   Creatinine, Ser 4.091.18 0.50 - 1.35 mg/dL   Calcium 9.2  8.4 - 81.110.5 mg/dL   Total Protein 7.5 6.0 - 8.3 g/dL   Albumin 4.0 3.5 - 5.2 g/dL   AST 22 0 - 37 U/L   ALT 15 0 - 53 U/L   Alkaline Phosphatase 54 39 - 117 U/L   Total Bilirubin 0.9 0.3 - 1.2 mg/dL   GFR calc non Af Amer 64 (L) >90 mL/min   GFR calc Af Amer 74 (L) >90 mL/min  Lipase, blood  Result Value Ref Range   Lipase 23 11 - 59 U/L  Urinalysis, Routine w reflex microscopic  Result Value Ref Range   Color, Urine YELLOW YELLOW   APPearance CLEAR CLEAR   Specific Gravity, Urine 1.025 1.005 - 1.030   pH 6.5 5.0 - 8.0   Glucose, UA NEGATIVE NEGATIVE mg/dL   Hgb urine dipstick TRACE (A) NEGATIVE   Bilirubin Urine NEGATIVE NEGATIVE   Ketones, ur NEGATIVE NEGATIVE mg/dL   Protein, ur NEGATIVE NEGATIVE mg/dL   Urobilinogen, UA 1.0 0.0 - 1.0 mg/dL   Nitrite NEGATIVE NEGATIVE   Leukocytes, UA NEGATIVE NEGATIVE  Urine microscopic-add on  Result Value Ref Range   Squamous Epithelial / LPF RARE RARE   WBC, UA 0-2 <3 WBC/hpf   RBC / HPF 7-10 <3 RBC/hpf   Bacteria, UA RARE RARE   Urine-Other MUCOUS PRESENT   I-stat troponin, ED (only if pt is 63 y.o. or older & pain is above umbilicus) - [facility]  Result Value Ref Range   Troponin i, poc 0.00 0.00 - 0.08 ng/mL   Comment 3              ASSESSMENT AND PLAN:  63 y.o. year old male with normal Annual physical exam     Signed, Elvina SidleKurt Lauenstein, MD 06/18/2014 1:18 PM

## 2014-09-26 ENCOUNTER — Emergency Department (HOSPITAL_COMMUNITY)
Admission: EM | Admit: 2014-09-26 | Discharge: 2014-09-26 | Disposition: A | Discharge status: Home / Self Care | Payer: BC Managed Care – PPO | Source: Home / Self Care | Attending: Emergency Medicine | Admitting: Emergency Medicine

## 2014-09-26 ENCOUNTER — Emergency Department (INDEPENDENT_AMBULATORY_CARE_PROVIDER_SITE_OTHER): Payer: BC Managed Care – PPO

## 2014-09-26 ENCOUNTER — Encounter (HOSPITAL_COMMUNITY): Payer: Self-pay | Admitting: Emergency Medicine

## 2014-09-26 DIAGNOSIS — J111 Influenza due to unidentified influenza virus with other respiratory manifestations: Secondary | ICD-10-CM

## 2014-09-26 LAB — POCT RAPID STREP A: Streptococcus, Group A Screen (Direct): NEGATIVE

## 2014-09-26 MED ORDER — OSELTAMIVIR PHOSPHATE 75 MG PO CAPS: 75.0000 mg | ORAL_CAPSULE | Freq: Two times a day (BID) | ORAL | Status: DC

## 2014-09-26 MED ORDER — MAGIC MOUTHWASH W/LIDOCAINE: 5.0000 mL | Freq: Three times a day (TID) | ORAL | Status: DC | PRN

## 2014-09-26 NOTE — ED Provider Notes (Signed)
CSN: [id number]     Arrival date & time 09/26/14  1622 History   First MD Initiated Contact with Patient 09/26/14 1722     Chief Complaint  Patient presents with  . URI   (Consider location/radiation/quality/duration/timing/severity/associated sxs/prior Treatment) HPI He is a 64 year old man here for evaluation of fever. He states he had a temperature of 101 at home this morning. This is associated with sore throat, nasal congestion, cough, body aches, headache. He denies any shortness of breath or wheezing. He states he felt like something was coming on for about a week, but it didn't get bad until last night and this morning. He reports some mild nausea. No vomiting or diarrhea. The cough was initially productive, but is currently dry. He has tried Mucinex without improvement. He is tolerating fluids well.  Past Medical History  Diagnosis Date  . Kidney stone   . PONV (postoperative nausea and vomiting)   . Arthritis    Past Surgical History  Procedure Laterality Date  . Back surgery    . Lithotripsy    . Cystoscopy with retrograde pyelogram, ureteroscopy and stent placement Right 12/31/2013    Procedure: CYSTOSCOPY WITH RETROGRADE PYELOGRAM, URETEROSCOPY AND STONE REMOVAL;  Surgeon: Kathi Ludwig, MD;  Location: [facility];  Service: Urology;  Laterality: Right;  . Eye surgery Right 07/2013   No family history on file. History  Substance Use Topics  . Smoking status: Never Smoker   . Smokeless tobacco: Never Used  . Alcohol Use: No    Review of Systems  Constitutional: Positive for appetite change and fatigue.  HENT: Positive for congestion, sore throat and trouble swallowing (due to pain). Negative for rhinorrhea.   Respiratory: Positive for cough. Negative for shortness of breath.   Gastrointestinal: Positive for nausea. Negative for vomiting and diarrhea.  Musculoskeletal: Positive for myalgias.  Neurological: Positive for headaches.    Allergies  Codeine  Home  Medications   Prior to Admission medications   Medication Sig Start Date End Date Taking? Authorizing Provider  Alum & Mag Hydroxide-Simeth (MAGIC MOUTHWASH W/LIDOCAINE) SOLN Take 5 mLs by mouth 3 (three) times daily as needed for mouth pain. 09/26/14   Charm Rings, MD  HYDROcodone-acetaminophen (NORCO/VICODIN) 5-325 MG per tablet Take 1-2 tablets by mouth every 6 (six) hours as needed. Patient not taking: Reported on 06/18/2014 03/03/14   Barron Alvine, MD  ondansetron (ZOFRAN) 4 MG tablet Take 1 tablet (4 mg total) by mouth every 8 (eight) hours as needed for nausea or vomiting. Patient not taking: Reported on 06/18/2014 03/03/14   Barron Alvine, MD  oseltamivir (TAMIFLU) 75 MG capsule Take 1 capsule (75 mg total) by mouth every 12 (twelve) hours. 09/26/14   Charm Rings, MD   BP 125/86 mmHg  Pulse 93  Temp(Src) 100.6 F (38.1 C) (Oral)  Resp 16  SpO2 100% Physical Exam  Constitutional: He is oriented to person, place, and time. He appears well-developed and well-nourished. No distress.  HENT:  Head: Normocephalic and atraumatic.  Nose: Rhinorrhea present.  Mouth/Throat: Posterior oropharyngeal erythema present. No oropharyngeal exudate.  Neck: Neck supple.  Cardiovascular: Normal rate, regular rhythm and normal heart sounds.   No murmur heard. Pulmonary/Chest: Effort normal. No respiratory distress. He has no wheezes. He has rales (Coarse crackles in bilateral bases).  Lymphadenopathy:    He has no cervical adenopathy.  Neurological: He is alert and oriented to person, place, and time.    ED Course  Procedures (including critical care time) Labs  Review Labs Reviewed  POCT RAPID STREP A ([facility] URG CARE ONLY)    Imaging Review Dg Chest 2 View  09/26/2014   CLINICAL DATA:  Patient started getting sick last night with fever  EXAM: CHEST  2 VIEW  COMPARISON:  12/30/2013  FINDINGS: There is mild cardiac enlargement. The mediastinal contours are within normal limits. Scar versus platelike  atelectasis noted at the left base. The visualized skeletal structures are unremarkable.  IMPRESSION: 1. Cardiac enlargement. 2. Scar versus platelike atelectasis and left base.   Electronically Signed   By: Signa Kellaylor  Stroud M.D.   On: 09/26/2014 18:30     MDM   1. Influenza    X-ray negative for pneumonia. He likely has the flu. We'll treat with Tamiflu as symptoms really started last night. Magic mouthwash for sore throat. Follow-up as needed.    Charm RingsErin J Annalyssa Thune, MD 09/26/14 [phone]

## 2014-09-26 NOTE — ED Notes (Signed)
C/o cold sx onset last night Sx include: ST, fevers, congestion, BA, productive cough Alert, no signs of acute distress.

## 2014-09-26 NOTE — Discharge Instructions (Signed)
You had the flu. Take Tamiflu 1 pill twice a day for 5 days. Drink plenty of fluids and get plenty of rest. Use the Magic mouthwash 3 times a day as needed for sore throat. Follow-up as needed.

## 2014-09-29 LAB — CULTURE, GROUP A STREP: STREP A CULTURE: POSITIVE — AB

## 2014-09-29 NOTE — ED Notes (Signed)
Out of work note extend until 3-17 , per wife, he is continuing to have fever 99-100. Note faxed to wife for him to recieve

## 2014-09-30 ENCOUNTER — Telehealth (HOSPITAL_COMMUNITY): Payer: Self-pay | Admitting: *Deleted

## 2014-09-30 MED ORDER — AMOXICILLIN 500 MG PO CAPS: 500.0000 mg | ORAL_CAPSULE | Freq: Two times a day (BID) | ORAL | Status: DC

## 2014-09-30 NOTE — ED Notes (Addendum)
Throat culture: Group A Strep.  3/14 Message sent to Dr. Piedad ClimesHonig.  She e-prescribed Amoxicillin to pt.'s pharmacy.  I called pt. and left a message to call. Call 1. Phillip Shannon, Phillip Shannon 09/30/2014 Pt. called back. Pt. verified x 2 and given result and told where to pick up his Rx. 09/30/2014 I called pt. and left messages on 3/17 and 3/18 to verify he had called back and picked up his Rx. 10/17/2014

## 2015-01-27 ENCOUNTER — Observation Stay (HOSPITAL_COMMUNITY)
Admission: EM | Admit: 2015-01-27 | Discharge: 2015-01-28 | Disposition: A | Discharge status: Home / Self Care | Payer: BC Managed Care – PPO | Attending: Urology | Admitting: Urology

## 2015-01-27 ENCOUNTER — Observation Stay (HOSPITAL_COMMUNITY): Payer: BC Managed Care – PPO | Admitting: Anesthesiology

## 2015-01-27 ENCOUNTER — Encounter (HOSPITAL_COMMUNITY)
Admission: EM | Disposition: A | Discharge status: Home / Self Care | Payer: Self-pay | Source: Home / Self Care | Attending: Emergency Medicine

## 2015-01-27 ENCOUNTER — Emergency Department (HOSPITAL_COMMUNITY): Payer: BC Managed Care – PPO

## 2015-01-27 ENCOUNTER — Encounter (HOSPITAL_COMMUNITY): Payer: Self-pay | Admitting: Emergency Medicine

## 2015-01-27 DIAGNOSIS — N23 Unspecified renal colic: Secondary | ICD-10-CM | POA: Diagnosis present

## 2015-01-27 DIAGNOSIS — N201 Calculus of ureter: Secondary | ICD-10-CM | POA: Diagnosis not present

## 2015-01-27 DIAGNOSIS — M1990 Unspecified osteoarthritis, unspecified site: Secondary | ICD-10-CM | POA: Insufficient documentation

## 2015-01-27 DIAGNOSIS — Z91030 Bee allergy status: Secondary | ICD-10-CM | POA: Insufficient documentation

## 2015-01-27 DIAGNOSIS — Z885 Allergy status to narcotic agent status: Secondary | ICD-10-CM | POA: Diagnosis not present

## 2015-01-27 HISTORY — PX: HOLMIUM LASER APPLICATION: SHX5852

## 2015-01-27 HISTORY — PX: CYSTOSCOPY WITH RETROGRADE PYELOGRAM, URETEROSCOPY AND STENT PLACEMENT: SHX5789

## 2015-01-27 LAB — COMPREHENSIVE METABOLIC PANEL
ALT: 19 U/L (ref 17–63)
AST: 18 U/L (ref 15–41)
Albumin: 4.4 g/dL (ref 3.5–5.0)
Alkaline Phosphatase: 53 U/L (ref 38–126)
Anion gap: 10 (ref 5–15)
BILIRUBIN TOTAL: 1.2 mg/dL (ref 0.3–1.2)
BUN: 17 mg/dL (ref 6–20)
CALCIUM: 8.8 mg/dL — AB (ref 8.9–10.3)
CO2: 24 mmol/L (ref 22–32)
CREATININE: 1.02 mg/dL (ref 0.61–1.24)
Chloride: 103 mmol/L (ref 101–111)
GFR calc non Af Amer: >60 mL/min (ref >60)
Glucose, Bld: 123 mg/dL — ABNORMAL HIGH (ref 65–99)
Potassium: 3.7 mmol/L (ref 3.5–5.1)
Sodium: 137 mmol/L (ref 135–145)
TOTAL PROTEIN: 7.8 g/dL (ref 6.5–8.1)

## 2015-01-27 LAB — URINALYSIS, ROUTINE W REFLEX MICROSCOPIC
Bilirubin Urine: NEGATIVE
Glucose, UA: NEGATIVE mg/dL
KETONES UR: NEGATIVE mg/dL
Leukocytes, UA: NEGATIVE
NITRITE: NEGATIVE
PROTEIN: NEGATIVE mg/dL
SPECIFIC GRAVITY, URINE: 1.02 (ref 1.005–1.030)
UROBILINOGEN UA: 0.2 mg/dL (ref 0.0–1.0)
pH: 6 (ref 5.0–8.0)

## 2015-01-27 LAB — URINE MICROSCOPIC-ADD ON

## 2015-01-27 LAB — CBC WITH DIFFERENTIAL/PLATELET
BASOS ABS: 0 10*3/uL (ref 0.0–0.1)
Basophils Relative: 0 % (ref 0–1)
Eosinophils Absolute: 0.2 10*3/uL (ref 0.0–0.7)
Eosinophils Relative: 3 % (ref 0–5)
HEMATOCRIT: 42.5 % (ref 39.0–52.0)
Hemoglobin: 15.1 g/dL (ref 13.0–17.0)
Lymphocytes Relative: 47 % — ABNORMAL HIGH (ref 12–46)
Lymphs Abs: 2.9 10*3/uL (ref 0.7–4.0)
MCH: 32.2 pg (ref 26.0–34.0)
MCHC: 35.5 g/dL (ref 30.0–36.0)
MCV: 90.6 fL (ref 78.0–100.0)
Monocytes Absolute: 0.5 10*3/uL (ref 0.1–1.0)
Monocytes Relative: 8 % (ref 3–12)
Neutro Abs: 2.6 10*3/uL (ref 1.7–7.7)
Neutrophils Relative %: 42 % — ABNORMAL LOW (ref 43–77)
Platelets: 219 10*3/uL (ref 150–400)
RBC: 4.69 MIL/uL (ref 4.22–5.81)
RDW: 14.3 % (ref 11.5–15.5)
WBC: 6.2 10*3/uL (ref 4.0–10.5)

## 2015-01-27 SURGERY — CYSTOURETEROSCOPY, WITH RETROGRADE PYELOGRAM AND STENT INSERTION
Anesthesia: General | Site: Ureter | Laterality: Left

## 2015-01-27 MED ORDER — PROPOFOL 10 MG/ML IV BOLUS
INTRAVENOUS | Status: AC
  Filled 2015-01-27: qty 20

## 2015-01-27 MED ORDER — OXYCODONE-ACETAMINOPHEN 5-325 MG PO TABS
1.0000 | ORAL_TABLET | ORAL | Status: DC | PRN
  Administered 2015-01-27: 2 via ORAL
  Administered 2015-01-28: 1 via ORAL
  Filled 2015-01-27 (×2): qty 2

## 2015-01-27 MED ORDER — ONDANSETRON HCL 4 MG/2ML IJ SOLN
4.0000 mg | Freq: Once | INTRAMUSCULAR | Status: AC
  Administered 2015-01-27: 4 mg via INTRAVENOUS
  Filled 2015-01-27: qty 2

## 2015-01-27 MED ORDER — HYDROMORPHONE HCL 1 MG/ML IJ SOLN
1.0000 mg | INTRAMUSCULAR | Status: DC | PRN
  Administered 2015-01-28: 1 mg via INTRAVENOUS
  Filled 2015-01-27: qty 1

## 2015-01-27 MED ORDER — HYDROMORPHONE HCL 1 MG/ML IJ SOLN
1.0000 mg | Freq: Once | INTRAMUSCULAR | Status: AC
  Administered 2015-01-27: 1 mg via INTRAVENOUS
  Filled 2015-01-27: qty 1

## 2015-01-27 MED ORDER — MIDAZOLAM HCL 2 MG/2ML IJ SOLN
INTRAMUSCULAR | Status: AC
  Filled 2015-01-27: qty 2

## 2015-01-27 MED ORDER — SUCCINYLCHOLINE CHLORIDE 20 MG/ML IJ SOLN
INTRAMUSCULAR | Status: DC | PRN
  Administered 2015-01-27: 100 mg via INTRAVENOUS

## 2015-01-27 MED ORDER — PROMETHAZINE HCL 25 MG/ML IJ SOLN: 6.2500 mg | INTRAMUSCULAR | Status: DC | PRN

## 2015-01-27 MED ORDER — LACTATED RINGERS IV SOLN
INTRAVENOUS | Status: DC | PRN
  Administered 2015-01-27 (×2): via INTRAVENOUS

## 2015-01-27 MED ORDER — ONDANSETRON HCL 4 MG/2ML IJ SOLN
INTRAMUSCULAR | Status: AC
  Filled 2015-01-27: qty 2

## 2015-01-27 MED ORDER — HYDROMORPHONE HCL 1 MG/ML IJ SOLN
1.0000 mg | INTRAMUSCULAR | Status: DC | PRN
  Administered 2015-01-27 (×2): 1 mg via INTRAVENOUS
  Filled 2015-01-27 (×2): qty 1

## 2015-01-27 MED ORDER — OXYCODONE-ACETAMINOPHEN 5-325 MG PO TABS: 1.0000 | ORAL_TABLET | Freq: Four times a day (QID) | ORAL | Status: DC | PRN

## 2015-01-27 MED ORDER — HYDROCODONE-ACETAMINOPHEN 5-325 MG PO TABS: 1.0000 | ORAL_TABLET | Freq: Four times a day (QID) | ORAL | Status: DC | PRN

## 2015-01-27 MED ORDER — LIDOCAINE HCL (CARDIAC) 20 MG/ML IV SOLN
INTRAVENOUS | Status: DC | PRN
  Administered 2015-01-27: 100 mg via INTRATRACHEAL
  Administered 2015-01-27: 100 mg via INTRAVENOUS

## 2015-01-27 MED ORDER — TAMSULOSIN HCL 0.4 MG PO CAPS
0.4000 mg | ORAL_CAPSULE | Freq: Every day | ORAL | Status: DC
Start: 2015-01-27 — End: 2015-03-11

## 2015-01-27 MED ORDER — ONDANSETRON HCL 4 MG PO TABS: 4.0000 mg | ORAL_TABLET | Freq: Four times a day (QID) | ORAL | Status: DC

## 2015-01-27 MED ORDER — IOHEXOL 300 MG/ML  SOLN
INTRAMUSCULAR | Status: DC | PRN
  Administered 2015-01-27: 4 mL via URETHRAL

## 2015-01-27 MED ORDER — FENTANYL CITRATE (PF) 100 MCG/2ML IJ SOLN
INTRAMUSCULAR | Status: AC
  Filled 2015-01-27: qty 2

## 2015-01-27 MED ORDER — ONDANSETRON HCL 4 MG/2ML IJ SOLN
INTRAMUSCULAR | Status: DC | PRN
  Administered 2015-01-27: 4 mg via INTRAVENOUS

## 2015-01-27 MED ORDER — KETOROLAC TROMETHAMINE 30 MG/ML IJ SOLN
30.0000 mg | Freq: Once | INTRAMUSCULAR | Status: AC
  Administered 2015-01-27: 30 mg via INTRAVENOUS
  Filled 2015-01-27: qty 1

## 2015-01-27 MED ORDER — PHENYLEPHRINE HCL 10 MG/ML IJ SOLN
INTRAMUSCULAR | Status: DC | PRN
  Administered 2015-01-27 (×3): 40 ug via INTRAVENOUS

## 2015-01-27 MED ORDER — MIDAZOLAM HCL 5 MG/5ML IJ SOLN
INTRAMUSCULAR | Status: DC | PRN
  Administered 2015-01-27: 2 mg via INTRAVENOUS

## 2015-01-27 MED ORDER — ALBUTEROL SULFATE HFA 108 (90 BASE) MCG/ACT IN AERS
INHALATION_SPRAY | RESPIRATORY_TRACT | Status: DC | PRN
  Administered 2015-01-27: 5 via RESPIRATORY_TRACT

## 2015-01-27 MED ORDER — PROPOFOL 10 MG/ML IV BOLUS
INTRAVENOUS | Status: DC | PRN
  Administered 2015-01-27: 200 mg via INTRAVENOUS
  Administered 2015-01-27: 100 mg via INTRAVENOUS

## 2015-01-27 MED ORDER — METOCLOPRAMIDE HCL 5 MG/ML IJ SOLN
INTRAMUSCULAR | Status: DC | PRN
  Administered 2015-01-27: 10 mg via INTRAVENOUS

## 2015-01-27 MED ORDER — 0.9 % SODIUM CHLORIDE (POUR BTL) OPTIME
TOPICAL | Status: DC | PRN
  Administered 2015-01-27: 1000 mL

## 2015-01-27 MED ORDER — SODIUM CHLORIDE 0.9 % IR SOLN
Status: DC | PRN
  Administered 2015-01-27: 3000 mL

## 2015-01-27 MED ORDER — SODIUM CHLORIDE 0.9 % IR SOLN
Status: DC | PRN
  Administered 2015-01-27: 1000 mL

## 2015-01-27 MED ORDER — FENTANYL CITRATE (PF) 100 MCG/2ML IJ SOLN
INTRAMUSCULAR | Status: DC | PRN
  Administered 2015-01-27 (×2): 50 ug via INTRAVENOUS

## 2015-01-27 MED ORDER — OXYCODONE-ACETAMINOPHEN 5-325 MG PO TABS: 1.0000 | ORAL_TABLET | ORAL | Status: DC | PRN

## 2015-01-27 MED ORDER — HYDROMORPHONE HCL 1 MG/ML IJ SOLN: 0.2500 mg | INTRAMUSCULAR | Status: DC | PRN

## 2015-01-27 MED ORDER — SODIUM CHLORIDE 0.9 % IV SOLN
INTRAVENOUS | Status: DC
Start: 2015-01-27 — End: 2015-01-28
  Administered 2015-01-27: 100 mL/h via INTRAVENOUS

## 2015-01-27 MED ORDER — LACTATED RINGERS IV SOLN
INTRAVENOUS | Status: DC
  Administered 2015-01-27: 22:00:00 via INTRAVENOUS

## 2015-01-27 MED ORDER — DEXAMETHASONE SODIUM PHOSPHATE 10 MG/ML IJ SOLN
INTRAMUSCULAR | Status: DC | PRN
  Administered 2015-01-27: 10 mg via INTRAVENOUS

## 2015-01-27 MED ORDER — ONDANSETRON HCL 4 MG/2ML IJ SOLN: 4.0000 mg | Freq: Four times a day (QID) | INTRAMUSCULAR | Status: DC | PRN

## 2015-01-27 MED ORDER — LIDOCAINE HCL (CARDIAC) 20 MG/ML IV SOLN
INTRAVENOUS | Status: AC
  Filled 2015-01-27: qty 5

## 2015-01-27 MED ORDER — GENTAMICIN SULFATE 40 MG/ML IJ SOLN
5.0000 mg/kg | Freq: Once | INTRAVENOUS | Status: AC
  Administered 2015-01-27: 440 mg via INTRAVENOUS
  Filled 2015-01-27: qty 11

## 2015-01-27 MED ORDER — SODIUM CHLORIDE 0.9 % IV BOLUS (SEPSIS)
1000.0000 mL | Freq: Once | INTRAVENOUS | Status: AC
  Administered 2015-01-27: 1000 mL via INTRAVENOUS

## 2015-01-27 SURGICAL SUPPLY — 26 items
BASKET LASER NITINOL 1.9FR (BASKET) ×3 IMPLANT
BASKET STNLS GEMINI 4WIRE 3FR (BASKET) IMPLANT
BASKET ZERO TIP NITINOL 2.4FR (BASKET) IMPLANT
CATH INTERMIT  6FR 70CM (CATHETERS) ×3 IMPLANT
CLOTH BEACON ORANGE TIMEOUT ST (SAFETY) ×3 IMPLANT
DRSG TEGADERM 2-3/8X2-3/4 SM (GAUZE/BANDAGES/DRESSINGS) ×3 IMPLANT
ELECT REM PT RETURN 9FT ADLT (ELECTROSURGICAL)
ELECTRODE REM PT RTRN 9FT ADLT (ELECTROSURGICAL) IMPLANT
FIBER LASER FLEXIVA 1000 (UROLOGICAL SUPPLIES) IMPLANT
FIBER LASER FLEXIVA 200 (UROLOGICAL SUPPLIES) ×3 IMPLANT
FIBER LASER FLEXIVA 365 (UROLOGICAL SUPPLIES) IMPLANT
FIBER LASER FLEXIVA 550 (UROLOGICAL SUPPLIES) IMPLANT
FIBER LASER TRAC TIP (UROLOGICAL SUPPLIES) IMPLANT
GLOVE BIOGEL M STRL SZ7.5 (GLOVE) ×3 IMPLANT
GLOVE SURG SS PI 6.5 STRL IVOR (GLOVE) ×3 IMPLANT
GOWN STRL REUS W/TWL XL LVL3 (GOWN DISPOSABLE) ×6 IMPLANT
GUIDEWIRE ANG ZIPWIRE 038X150 (WIRE) ×3 IMPLANT
GUIDEWIRE STR DUAL SENSOR (WIRE) ×3 IMPLANT
IV NS IRRIG 3000ML ARTHROMATIC (IV SOLUTION) ×3 IMPLANT
IV SOD CHL 0.9% 1000ML (IV SOLUTION) ×3 IMPLANT
NS IRRIG 1000ML POUR BTL (IV SOLUTION) ×3 IMPLANT
PACK CYSTO (CUSTOM PROCEDURE TRAY) ×3 IMPLANT
STENT POLARIS 5FRX26 (STENTS) ×3 IMPLANT
SYRINGE 10CC LL (SYRINGE) IMPLANT
SYRINGE IRR TOOMEY STRL 70CC (SYRINGE) IMPLANT
TUBE FEEDING 8FR 16IN STR KANG (MISCELLANEOUS) ×3 IMPLANT

## 2015-01-27 NOTE — ED Notes (Signed)
Rees at bedside. 

## 2015-01-27 NOTE — Anesthesia Preprocedure Evaluation (Addendum)
Anesthesia Evaluation  Patient identified by MRN, date of birth, ID band Patient awake    Reviewed: Allergy & Precautions, H&P , NPO status , Patient's Chart, lab work & pertinent test results  History of Anesthesia Complications (+) PONV and history of anesthetic complications  Airway Mallampati: I  TM Distance: >3 FB Neck ROM: Full    Dental  (+) Missing, Poor Dentition, Dental Advisory Given,    Pulmonary neg pulmonary ROS,  breath sounds clear to auscultation  Pulmonary exam normal       Cardiovascular negative cardio ROS Normal cardiovascular examRhythm:Regular Rate:Normal     Neuro/Psych negative neurological ROS  negative psych ROS   GI/Hepatic negative GI ROS, Neg liver ROS,   Endo/Other    Renal/GU Renal disease  negative genitourinary   Musculoskeletal  (+) Arthritis -,   Abdominal   Peds  Hematology negative hematology ROS (+)   Anesthesia Other Findings NPO appropriate but has nausea and vomit bag with him so will opt to secure airway, allergies reviewed Denies active cardiac or pulmonary symptoms, does give vague history of a-fib at one time, EKG in sinus rhythm, physical exam in sinus rhythm currently, on no medication for a-fib, denies chest pain with exertion, METS > 4 No recent congestive cough or symptoms of upper respiratory infection Meds - dilaudid IV, gent, zofran IV   Reproductive/Obstetrics                          Anesthesia Physical  Anesthesia Plan  ASA: III  Anesthesia Plan: General   Post-op Pain Management:    Induction: Intravenous, Rapid sequence and Cricoid pressure planned  Airway Management Planned: Oral ETT  Additional Equipment:   Intra-op Plan:   Post-operative Plan: Extubation in OR  Informed Consent: I have reviewed the patients History and Physical, chart, labs and discussed the procedure including the risks, benefits and alternatives  for the proposed anesthesia with the patient or authorized representative who has indicated his/her understanding and acceptance.   Dental advisory given  Plan Discussed with: CRNA  Anesthesia Plan Comments:       Anesthesia Quick Evaluation

## 2015-01-27 NOTE — ED Notes (Signed)
Patient transported to CT 

## 2015-01-27 NOTE — ED Notes (Signed)
Pt is c/o left flank pain  Pt states he has had dark urine on Friday then it cleared up some on Saturday and Sunday  Pt states the pain got really bad this morning about an hour ago  Pt has active vomiting in triage  Pt has hx of kidney stones

## 2015-01-27 NOTE — ED Notes (Signed)
Per Madilyn Hookees urology will be at bedside in an hour.

## 2015-01-27 NOTE — Brief Op Note (Signed)
01/27/2015  8:04 PM  PATIENT:  Phillip Shannon  64 y.o. male  PRE-OPERATIVE DIAGNOSIS:  left ureteral stone  POST-OPERATIVE DIAGNOSIS:  left ureteral stone  PROCEDURE:  Procedure(s): CYSTOSCOPY WITH LEFT RETROGRADE PYELOGRAM, LEFT  SEMI-RIGID AND DIGITAL  URETEROSCOPY AND STENT  LEFT PLACEMENT STONE EXTRACTION (Left) HOLMIUM LASER APPLICATION (Left)  SURGEON:  Surgeon(s) and Role:    * Sebastian Acheheodore Balen Woolum, MD - Primary  PHYSICIAN ASSISTANT:   ASSISTANTS: none   ANESTHESIA:   general  EBL:     BLOOD ADMINISTERED:none  DRAINS: none   LOCAL MEDICATIONS USED:  NONE  SPECIMEN:  Source of Specimen:  left ureteral stone  DISPOSITION OF SPECIMEN:  [facility] for compositional analysis  COUNTS:  YES  TOURNIQUET:  * No tourniquets in log *  DICTATION: .Other Dictation: Dictation Number [id number]  PLAN OF CARE: Admit for overnight observation  PATIENT DISPOSITION:  PACU - hemodynamically stable.   Delay start of Pharmacological VTE agent (>24hrs) due to surgical blood loss or risk of bleeding: yes

## 2015-01-27 NOTE — ED Notes (Signed)
Pt transported to CT at present time; will reassess pain and ability to provide urine sample with pt return.

## 2015-01-27 NOTE — Anesthesia Postprocedure Evaluation (Signed)
  Anesthesia Post-op Note  Patient: Phillip Shannon  Procedure(s) Performed: Procedure(s) (LRB): CYSTOSCOPY WITH LEFT RETROGRADE PYELOGRAM, LEFT  SEMI-RIGID AND DIGITAL  URETEROSCOPY AND LEFT  STENT PLACEMENT STONE EXTRACTION (Left) HOLMIUM LASER APPLICATION (Left)  Patient Location: PACU  Anesthesia Type: General  Level of Consciousness: awake and alert   Airway and Oxygen Therapy: Patient Spontanous Breathing  Post-op Pain: mild  Post-op Assessment: Post-op Vital signs reviewed, Patient's Cardiovascular Status Stable, Respiratory Function Stable, Patent Airway and No signs of Nausea or vomiting  Last Vitals:  Filed Vitals:   01/27/15 2045  BP: 140/76  Pulse: 63  Temp:   Resp: 14    Post-op Vital Signs: stable   Complications: No apparent anesthesia complications

## 2015-01-27 NOTE — Transfer of Care (Signed)
Immediate Anesthesia Transfer of Care Note  Patient: Phillip Shannon  Procedure(s) Performed: Procedure(s): CYSTOSCOPY WITH LEFT RETROGRADE PYELOGRAM, LEFT  SEMI-RIGID AND DIGITAL  URETEROSCOPY AND LEFT  STENT PLACEMENT STONE EXTRACTION (Left) HOLMIUM LASER APPLICATION (Left)  Patient Location: PACU  Anesthesia Type:General  Level of Consciousness:  sedated, patient cooperative and responds to stimulation  Airway & Oxygen Therapy:Patient Spontanous Breathing and Patient connected to face mask oxgen  Post-op Assessment:  Report given to PACU RN and Post -op Vital signs reviewed and stable  Post vital signs:  Reviewed and stable  Last Vitals:  Filed Vitals:   01/27/15 2025  BP:   Pulse: 76  Temp:   Resp: 18    Complications: No apparent anesthesia complications

## 2015-01-27 NOTE — ED Notes (Addendum)
Post review of discharge paperwork pt denies pain. Marylu LundJanet offers wheelchair upon departure; pt states "pain is much worse." Madilyn Hookees notified.

## 2015-01-27 NOTE — H&P (Signed)
Phillip Shannon is an 64 y.o. male.    Chief Complaint: Left Ureteral Stone, Refractory Pain  HPI:   1 - Left Ureteral Stone, Refractory Pain - 3x53m left mid ureteral stone with mild hydro by ER CT on eval colikly left flank pain. No additional or contralateral stones. Cr <1. UA without infectious parameters. HE is with 9/10 pain despite ketorolac and several rounds IV narcotics.  PMS sig for ureteroscopy x1, eye surgery, AFib (no anticoagulation).   Today " [name] is seen as ER admission for above.   Past Medical History  Diagnosis Date  . Kidney stone   . PONV (postoperative nausea and vomiting)   . Arthritis     Past Surgical History  Procedure Laterality Date  . Back surgery    . Lithotripsy    . Cystoscopy with retrograde pyelogram, ureteroscopy and stent placement Right 12/31/2013    Procedure: CYSTOSCOPY WITH RETROGRADE PYELOGRAM, URETEROSCOPY AND STONE REMOVAL;  Surgeon: SAilene Rud MD;  Location: [facility];  Service: Urology;  Laterality: Right;  . Eye surgery Right 07/2013    History reviewed. No pertinent family history. Social History:  reports that he has never smoked. He has never used smokeless tobacco. He reports that he does not drink alcohol or use illicit drugs.  Allergies:  Allergies  Allergen Reactions  . Hornet Venom Anaphylaxis, Hives and Swelling    Swelling all over   . Codeine Nausea And Vomiting     (Not in a hospital admission)  Results for orders placed or performed during the hospital encounter of 01/27/15 (from the past 48 hour(s))  Comprehensive metabolic panel     Status: Abnormal   Collection Time: 01/27/15  7:22 AM  Result Value Ref Range   Sodium 137 135 - 145 mmol/L   Potassium 3.7 3.5 - 5.1 mmol/L   Chloride 103 101 - 111 mmol/L   CO2 24 22 - 32 mmol/L   Glucose, Bld 123 (H) 65 - 99 mg/dL   BUN 17 6 - 20 mg/dL   Creatinine, Ser 1.02 0.61 - 1.24 mg/dL   Calcium 8.8 (L) 8.9 - 10.3 mg/dL   Total Protein 7.8 6.5 - 8.1  g/dL   Albumin 4.4 3.5 - 5.0 g/dL   AST 18 15 - 41 U/L   ALT 19 17 - 63 U/L   Alkaline Phosphatase 53 38 - 126 U/L   Total Bilirubin 1.2 0.3 - 1.2 mg/dL   GFR calc non Af Amer >60 >60 mL/min   GFR calc Af Amer >60 >60 mL/min    Comment: (NOTE) The eGFR has been calculated using the CKD EPI equation. This calculation has not been validated in all clinical situations. eGFR's persistently <60 mL/min signify possible Chronic Kidney Disease.    Anion gap 10 5 - 15  CBC with Differential     Status: Abnormal   Collection Time: 01/27/15  7:22 AM  Result Value Ref Range   WBC 6.2 4.0 - 10.5 K/uL   RBC 4.69 4.22 - 5.81 MIL/uL   Hemoglobin 15.1 13.0 - 17.0 g/dL   HCT 42.5 39.0 - 52.0 %   MCV 90.6 78.0 - 100.0 fL   MCH 32.2 26.0 - 34.0 pg   MCHC 35.5 30.0 - 36.0 g/dL   RDW 14.3 11.5 - 15.5 %   Platelets 219 150 - 400 K/uL   Neutrophils Relative % 42 (L) 43 - 77 %   Neutro Abs 2.6 1.7 - 7.7 K/uL   Lymphocytes Relative  47 (H) 12 - 46 %   Lymphs Abs 2.9 0.7 - 4.0 K/uL   Monocytes Relative 8 3 - 12 %   Monocytes Absolute 0.5 0.1 - 1.0 K/uL   Eosinophils Relative 3 0 - 5 %   Eosinophils Absolute 0.2 0.0 - 0.7 K/uL   Basophils Relative 0 0 - 1 %   Basophils Absolute 0.0 0.0 - 0.1 K/uL  Urinalysis, Routine w reflex microscopic (not at [facility])     Status: Abnormal   Collection Time: 01/27/15  8:50 AM  Result Value Ref Range   Color, Urine YELLOW YELLOW   APPearance CLEAR CLEAR   Specific Gravity, Urine 1.020 1.005 - 1.030   pH 6.0 5.0 - 8.0   Glucose, UA NEGATIVE NEGATIVE mg/dL   Hgb urine dipstick LARGE (A) NEGATIVE   Bilirubin Urine NEGATIVE NEGATIVE   Ketones, ur NEGATIVE NEGATIVE mg/dL   Protein, ur NEGATIVE NEGATIVE mg/dL   Urobilinogen, UA 0.2 0.0 - 1.0 mg/dL   Nitrite NEGATIVE NEGATIVE   Leukocytes, UA NEGATIVE NEGATIVE  Urine microscopic-add on     Status: None   Collection Time: 01/27/15  8:50 AM  Result Value Ref Range   Squamous Epithelial / LPF RARE RARE   RBC / HPF TOO  NUMEROUS TO COUNT <3 RBC/hpf   Bacteria, UA RARE RARE   Ct Renal Stone Study  01/27/2015   CLINICAL DATA:  64 year old male with left flank pain. History kidney stones. Subsequent encounter.  EXAM: CT ABDOMEN AND PELVIS WITHOUT CONTRAST  TECHNIQUE: Multidetector CT imaging of the abdomen and pelvis was performed following the standard protocol without IV contrast.  COMPARISON:  04/01/2014 plain film exam of the abdomen. 12/28/2013 CT abdomen and pelvis.  FINDINGS: 5 x 4 x 6 mm mid left ureteral obstructing stone located 15 cm proximal to left ureterovesical junction with moderate left hydroureteronephrosis.  Tiny calcifications of the papilla consistent with medullary sponge kidney bilaterally. 5 cm right renal cyst.  Noncontrast filled underdistended views of the urinary bladder with mild circumferential wall thickening and slightly prominent fat at the wall urine interface unchanged from the prior exam. Evaluation limited by lack of contrast filled views.  Enlarged slightly lobulated prostate gland minimally more notable on the left. Clinical and laboratory correlation recommended.  No extra luminal bowel inflammatory process, free fluid or free air.  Taking into account limitation by non contrast imaging, no worrisome hepatic, splenic, pancreatic or adrenal lesion. No calcified gallstones  Top-normal size external iliac lymph nodes unchanged.  Lumbar degenerative changes most prominent L4-5 and L5-S1. Nonspecific benign-appearing lesion right ilium unchanged. Tiny sclerotic focus left femoral head possibly a bone island without other sclerotic foci noted.  Small hiatal hernia  Heart slightly enlarged.  Atherosclerotic type changes of the abdominal aorta and iliac arteries with mild ectasia. Minimal bulge left lateral aspect of the abdominal aorta with maximal transverse dimension 2.9 cm. Retro aortic left renal vein incidentally noted.  Scarring lung bases.  IMPRESSION: 5 x 4 x 6 mm mid left ureteral  obstructing stone located 15 cm proximal to left ureterovesical junction with moderate left hydroureteronephrosis.  Tiny calcifications of the papilla consistent with medullary sponge kidney bilaterally. 5 cm right renal cyst.  Enlarged slightly lobulated prostate gland minimally more notable on the left. Clinical and laboratory correlation recommended.  Lumbar degenerative changes most prominent L4-5 and L5-S1. Nonspecific benign-appearing lesion right ilium unchanged.  Heart slightly enlarged.  Atherosclerotic type changes of the abdominal aorta and iliac arteries with mild ectasia.  Minimal bulge left lateral aspect of the abdominal aorta with maximal transverse dimension 2.9 cm.   Electronically Signed   By: Genia Del M.D.   On: 01/27/2015 08:22    Review of Systems  Constitutional: Negative.  Negative for fever and chills.  HENT: Negative.   Eyes: Negative.   Respiratory: Negative.   Cardiovascular: Negative.   Gastrointestinal: Negative.   Genitourinary: Positive for flank pain.  Musculoskeletal: Negative.   Skin: Negative.   Neurological: Negative.   Endo/Heme/Allergies: Negative.   Psychiatric/Behavioral: Negative.     Blood pressure 127/75, pulse 61, temperature 97.7 F (36.5 C), temperature source Oral, resp. rate 16, height 5' 10" (1.778 m), weight 111.131 kg (245 lb), SpO2 95 %. Physical Exam  Constitutional: He is oriented to person, place, and time. He appears well-developed.  Wife at bedside  HENT:  Head: Normocephalic.  Eyes: Pupils are equal, round, and reactive to light.  Neck: Normal range of motion.  Cardiovascular: Normal rate.   Respiratory: Effort normal.  GI: Soft.  Genitourinary:  Moderate left CVAT  Musculoskeletal: Normal range of motion.  Neurological: He is alert and oriented to person, place, and time.  Skin: Skin is warm and dry.  Psychiatric: He has a normal mood and affect. His behavior is normal. Judgment and thought content normal.      Assessment/Plan  1 - Left Ureteral Stone, Refractory Pain -   We discussed management strategies of ureteral stones including medical expulsive therapy (MET) (preferred for stones <76m diameter), ureteroscopic stone manipulation (URS), and shockwave lithotripsy (SWL) in detail including relative risks / benefits / and efficacy. We discussed that all patients are candidates for MET as long as can keep comfortable and hydrated.   After consideration of options, the patient has decided to proceed with left URS today as his pain is uncontrollable.    We discussed ureteroscopic stone manipulation with basketing and laser-lithotripsy in detail.  We discussed risks including bleeding, infection, damage to kidney / ureter  bladder, rarely loss of kidney. We discussed anesthetic risks and rare but serious surgical complications including DVT, PE, MI, and mortality. We specifically addressed that in 5-10% of cases a staged approach is required with stenting followed by re-attempt ureteroscopy if anatomy unfavorable. The patient voiced understanding and wises to proceed as end of day add on with admission for pain contorl prior.     MANNY, THEODORE 01/27/2015, 12:34 PM

## 2015-01-27 NOTE — ED Provider Notes (Addendum)
CSN: [id number]     Arrival date & time 01/27/15  6962 History   First MD Initiated Contact with Patient 01/27/15 [id number]     Chief Complaint  Patient presents with  . Flank Pain     Patient is a 64 y.o. male presenting with flank pain. The history is provided by the patient. No language interpreter was used.  Flank Pain   Mr. [facility] for evaluation of left flank pain. He believes this is a kidney stone. She reports 3 days of left flank pain. The pain was initially intense and constant. It is now waxing and waning radiating more to his left lower back. He reports darker urine intermittently for the last few days. Denies any fevers, abdominal pain, dysuria. Today he developed vomiting. He had similar symptoms previously was diagnosed with a kidney stone that required surgical removal. Symptoms are moderate and worsening. He is followed by Dr. Isabel Caprice with urology. He was recently diagnosed with atrial fibrillation but is not on any anticoagulants.  Past Medical History  Diagnosis Date  . Kidney stone   . PONV (postoperative nausea and vomiting)   . Arthritis    Past Surgical History  Procedure Laterality Date  . Back surgery    . Lithotripsy    . Cystoscopy with retrograde pyelogram, ureteroscopy and stent placement Right 12/31/2013    Procedure: CYSTOSCOPY WITH RETROGRADE PYELOGRAM, URETEROSCOPY AND STONE REMOVAL;  Surgeon: Kathi Ludwig, MD;  Location: [facility];  Service: Urology;  Laterality: Right;  . Eye surgery Right 07/2013   History reviewed. No pertinent family history. History  Substance Use Topics  . Smoking status: Never Smoker   . Smokeless tobacco: Never Used  . Alcohol Use: No    Review of Systems  Genitourinary: Positive for flank pain.  All other systems reviewed and are negative.     Allergies  Codeine  Home Medications   Prior to Admission medications   Medication Sig Start Date End Date Taking? Authorizing Provider  Alum & Mag  Hydroxide-Simeth (MAGIC MOUTHWASH W/LIDOCAINE) SOLN Take 5 mLs by mouth 3 (three) times daily as needed for mouth pain. 09/26/14   Charm Rings, MD  amoxicillin (AMOXIL) 500 MG capsule Take 1 capsule (500 mg total) by mouth 2 (two) times daily. 09/30/14   Charm Rings, MD  HYDROcodone-acetaminophen (NORCO/VICODIN) 5-325 MG per tablet Take 1-2 tablets by mouth every 6 (six) hours as needed. Patient not taking: Reported on 06/18/2014 03/03/14   Barron Alvine, MD  ondansetron (ZOFRAN) 4 MG tablet Take 1 tablet (4 mg total) by mouth every 8 (eight) hours as needed for nausea or vomiting. Patient not taking: Reported on 06/18/2014 03/03/14   Barron Alvine, MD  oseltamivir (TAMIFLU) 75 MG capsule Take 1 capsule (75 mg total) by mouth every 12 (twelve) hours. 09/26/14   Charm Rings, MD   BP 157/95 mmHg  Pulse 78  Temp(Src) 98.6 F (37 C) (Oral)  Resp 22  Ht  (1.778 m)  Wt 245 lb (111.131 kg)  BMI 35.15 kg/m2  SpO2 94% Physical Exam  Constitutional: He is oriented to person, place, and time. He appears well-developed and well-nourished.  HENT:  Head: Normocephalic and atraumatic.  Cardiovascular: Normal rate and regular rhythm.   No murmur heard. Pulmonary/Chest: Effort normal and breath sounds normal. No respiratory distress.  Abdominal: Soft. There is no rebound and no guarding.  Mild LLQ/Left flank tenderness  Musculoskeletal: He exhibits no edema or tenderness.  Neurological: He is alert  and oriented to person, place, and time.  Skin: Skin is warm and dry.  Psychiatric: He has a normal mood and affect. His behavior is normal.  Nursing note and vitals reviewed.   ED Course  Procedures (including critical care time) Labs Review Labs Reviewed  COMPREHENSIVE METABOLIC PANEL - Abnormal; Notable for the following:    Glucose, Bld 123 (*)    Calcium 8.8 (*)    All other components within normal limits  CBC WITH DIFFERENTIAL/PLATELET - Abnormal; Notable for the following:    Neutrophils  Relative % 42 (*)    Lymphocytes Relative 47 (*)    All other components within normal limits  URINALYSIS, ROUTINE W REFLEX MICROSCOPIC (NOT AT [facility]) - Abnormal; Notable for the following:    Hgb urine dipstick LARGE (*)    All other components within normal limits  URINE CULTURE  URINE MICROSCOPIC-ADD ON    Imaging Review Ct Renal Stone Study  01/27/2015   CLINICAL DATA:  64 year old male with left flank pain. History kidney stones. Subsequent encounter.  EXAM: CT ABDOMEN AND PELVIS WITHOUT CONTRAST  TECHNIQUE: Multidetector CT imaging of the abdomen and pelvis was performed following the standard protocol without IV contrast.  COMPARISON:  04/01/2014 plain film exam of the abdomen. 12/28/2013 CT abdomen and pelvis.  FINDINGS: 5 x 4 x 6 mm mid left ureteral obstructing stone located 15 cm proximal to left ureterovesical junction with moderate left hydroureteronephrosis.  Tiny calcifications of the papilla consistent with medullary sponge kidney bilaterally. 5 cm right renal cyst.  Noncontrast filled underdistended views of the urinary bladder with mild circumferential wall thickening and slightly prominent fat at the wall urine interface unchanged from the prior exam. Evaluation limited by lack of contrast filled views.  Enlarged slightly lobulated prostate gland minimally more notable on the left. Clinical and laboratory correlation recommended.  No extra luminal bowel inflammatory process, free fluid or free air.  Taking into account limitation by non contrast imaging, no worrisome hepatic, splenic, pancreatic or adrenal lesion. No calcified gallstones  Top-normal size external iliac lymph nodes unchanged.  Lumbar degenerative changes most prominent L4-5 and L5-S1. Nonspecific benign-appearing lesion right ilium unchanged. Tiny sclerotic focus left femoral head possibly a bone island without other sclerotic foci noted.  Small hiatal hernia  Heart slightly enlarged.  Atherosclerotic type changes of the  abdominal aorta and iliac arteries with mild ectasia. Minimal bulge left lateral aspect of the abdominal aorta with maximal transverse dimension 2.9 cm. Retro aortic left renal vein incidentally noted.  Scarring lung bases.  IMPRESSION: 5 x 4 x 6 mm mid left ureteral obstructing stone located 15 cm proximal to left ureterovesical junction with moderate left hydroureteronephrosis.  Tiny calcifications of the papilla consistent with medullary sponge kidney bilaterally. 5 cm right renal cyst.  Enlarged slightly lobulated prostate gland minimally more notable on the left. Clinical and laboratory correlation recommended.  Lumbar degenerative changes most prominent L4-5 and L5-S1. Nonspecific benign-appearing lesion right ilium unchanged.  Heart slightly enlarged.  Atherosclerotic type changes of the abdominal aorta and iliac arteries with mild ectasia. Minimal bulge left lateral aspect of the abdominal aorta with maximal transverse dimension 2.9 cm.   Electronically Signed   By: Lacy Duverney M.D.   On: 01/27/2015 08:22     EKG Interpretation None      MDM   Final diagnoses:  Renal colic on left side    Pt here for evaluation of flank pain.  CT demonstrates obstructing renal cartilage on the  left. There is no evidence of acute infectious process. BMP with normal renal function. Patient had transient improvement in his pain with Toradol, required additional Dilaudid with improvement in his pain. Discussed with patient home care for renal colic as well as return precautions, importance of Urology follow up.      Tilden FossaElizabeth Courtland Reas, MD 01/27/15 [phone]  On repeat eval pt with continued pain despite three doses of dilaudid. Discussed with Dr. Berneice HeinrichManny who will see the patient in the ED.    Tilden FossaElizabeth Alegandro Macnaughton, MD 01/27/15 1116

## 2015-01-27 NOTE — Anesthesia Procedure Notes (Signed)
Procedure Name: Intubation Date/Time: 01/27/2015 7:20 PM Performed by: Epimenio SarinJARVELA, Phillip Shannon-anesthesia Checklist: Patient identified, Emergency Drugs available, Suction available, Patient being monitored and Timeout performed Patient Re-evaluated:Patient Re-evaluated prior to inductionOxygen Delivery Method: Circle system utilized Preoxygenation: Shannon-oxygenation with 100% oxygen Intubation Type: IV induction, Rapid sequence and Cricoid Pressure applied Laryngoscope Size: Mac and 3 Grade View: Grade I Tube type: Oral Tube size: 7.5 mm Number of attempts: 1 Airway Equipment and Method: Stylet Placement Confirmation: ETT inserted through vocal cords under direct vision,  positive ETCO2 and breath sounds checked- equal and bilateral Secured at: 23 cm Tube secured with: Tape Dental Injury: Teeth and Oropharynx as per Shannon-operative assessment

## 2015-01-28 ENCOUNTER — Encounter (HOSPITAL_COMMUNITY): Payer: Self-pay | Admitting: Urology

## 2015-01-28 LAB — URINE CULTURE: CULTURE: NO GROWTH

## 2015-01-28 MED ORDER — SULFAMETHOXAZOLE-TRIMETHOPRIM 800-160 MG PO TABS: 1.0000 | ORAL_TABLET | Freq: Two times a day (BID) | ORAL | Status: DC

## 2015-01-28 MED ORDER — SENNOSIDES-DOCUSATE SODIUM 8.6-50 MG PO TABS: 1.0000 | ORAL_TABLET | Freq: Two times a day (BID) | ORAL | Status: DC

## 2015-01-28 MED ORDER — OXYCODONE-ACETAMINOPHEN 5-325 MG PO TABS: 1.0000 | ORAL_TABLET | Freq: Four times a day (QID) | ORAL | Status: DC | PRN

## 2015-01-28 NOTE — Progress Notes (Signed)
Went over all discharge information with pt and wife.  Prescriptions given.  Per Dr. Berneice HeinrichManny pt did not need prescription for flomax or zofran, pt aware.  All questions answered.  Pt will be wheeled out by NT.

## 2015-01-28 NOTE — Op Note (Signed)
Phillip Shannon              ACCOUNT NO.:  [id number]  MEDICAL RECORD NO.:  [id number]  LOCATION:  1431                         FACILITY:  [facility]  PHYSICIAN:  Sebastian Ache, MD     DATE OF BIRTH:  1951/03/19  DATE OF PROCEDURE: 01/27/2015                               OPERATIVE REPORT  DIAGNOSIS:  Left ureteral stone with refractory pain.  PROCEDURES: 1. Cystoscopy with left retrograde pyelogram and interpretation. 2. Left ureteroscopy with laser lithotripsy. 3. Insertion of left ureteral stent, 5 x 26 with tether to dorsum of     the penis.  SURGEON:  Sebastian Ache, MD.  ESTIMATED BLOOD LOSS:  Nil.  COMPLICATIONS:  None.  SPECIMEN:  Left ureteral stone fragments for compositional analysis.  FINDINGS: 1. Unremarkable urethra. 2. Unremarkable urinary bladder. 3. Mild hydroureteronephrosis due to a mid left ureteral stone. 4. Complete resolution of all left-sided stone fragments, larger than     one-third millimeter following holmium laser lithotripsy. 5. Stent placement with proximal in upper pole, distal in urinary bladder.  INDICATION:  Phillip Shannon is a 64 year old gentleman with history of prior nephrolithiasis who had an acute onset of left flank pain yesterday.  He presented to the ER, was found to have a left mid ureteral stone that was somewhat ovoid in appearance.  Despite multiple rounds of narcotics and anti-inflammatories, his pain was completely refractory.  Options were discussed for management including inpatient medical  therapy versus more definitive management with ureteroscopy versus shockwave lithotripsy, and he adamantly wished to proceed with ureteroscopic stone manipulation.  Informed consent was obtained and placed in the medical record.  PROCEDURE IN DETAIL:  The patient being Phillip Shannon and the procedure being left ureteroscopic stone manipulation were confirmed.  Procedure was carried out.  A time-out was performed.  Intravenous  antibiotics were administered.  General LMA anesthesia was introduced.  The patient was placed into a low lithotomy position.  Sterile field was created by prepping and draping the patient's penis, perineum, and proximal thighs using iodine x3.  Next, cystourethroscopy was performed using a 23- French rigid cystoscope with offest lens.  Inspection of the anterior and posterior urethra were unremarkable.  Inspection of the urinary bladder revealed no diverticula, calcifications, or papular lesions.  The left ureteral orifice was cannulated with a 6-French end- hole catheter, and left retrograde pyelogram was obtained.  Left retrograde pyelogram demonstrated a single left ureter, single system left kidney.  There was very mild hydroureteronephrosis to a mobile filling defect in the mid ureter.  A 0.038 ZIPwire was advanced at the level of the upper pole and set aside as a safety wire.  Next, semi-rigid ureteroscopy was performed in the distal 2/3rd of the left ureter alongside a separate Sensor working wire.  No mucosal abnormalities were found.  The calcification in question was not visualized.  This just slightly retrograde positioning.  Given the relatively small size of the stone, the sheath was not placed, and the semi-rigid ureteroscope was exchanged over the Sensor working wire for the dual channel flexible digital ureteroscope which was carefully placed using fluoroscopic guidance to the level of the upper pole. Next, flexible digital ureteroscopy was performed of the  entire left kidney inspecting each calyx x2.  There was a single calcification corresponding to retrograde positioning of prior ureteral stone.  This was somewhat fusiform.  This did appear amenable to simple basketing at this point, as such, it was grasped on its long axis and navigated down the ureter, the more distal ureter.  The stone would not easily navigate.  It was felt that laser lithotripsy was warranted as  such the escape basket was purposely cut and used as a grasper for the stone. The ureteroscope was once again positioned allowing visualization of the stone within the basket.  Holmium laser energy was applied to the stone, fragmenting into two pieces which were then navigated within the same basket on their long axis they were easily navigated out and set aside for compositional analysis.  Following this, there was complete resolution of all visible stone fragments and excellent hemostasis.  No evidence of urethral injury.  Given the patient's significant colic symptoms preoperatively from relatively small volume stone, it was felt that interval stenting would be warranted to ensure decompression of the kidney, as such, a new 5 x 26 Polaris stent was placed using fluoroscopic guidance. Good proximal and distal deployment were noted, proximal in upper pole, distal in urinary bladder.  Bladder was emptied per cystoscope. Procedure was then terminated.  The patient tolerated the procedure well.  There were no immediate periprocedural complications.  The patient was taken to postanesthesia care unit in stable condition.          ______________________________ Sebastian Acheheodore Aneliese Beaudry, MD     TM/MEDQ  D:  01/27/2015  T:  01/28/2015  Job:  [id number]

## 2015-01-28 NOTE — Discharge Summary (Signed)
Physician Discharge Summary  Patient ID: Phillip Shannon MRN: [id number] DOB/AGE: 11/15/1950 64 y.o.  Admit date: 01/27/2015 Discharge date: 01/28/2015  Admission Diagnoses: Left Ureteral Stone  Discharge Diagnoses:  Active Problems:   Renal colic on left side   Left ureteral stone   Discharged Condition: good  Hospital Course:   1- Left Ureteral Stone - 6mm fusiform mid ureteral stone by ER CT on 7/12 , the day of admission. Pt with colic pain refractory to IV meds and inflammatories therefore admitted for pain control and underwent left ureteroscopy with laser lithotripsy and tethered stent placement also on 7/12 withotu acute complications. By 7/13, the day of discharge, he is ambulatory, pain controlled on PO meds, voiding w/o complaints, and felt to be adequate for discharge.  Consults: None  Significant Diagnostic Studies: labs: Cr<1.5  Treatments: surgery:  left ureteroscopy with laser lithotripsy and tethered stent placement also on 7/12  Discharge Exam: Blood pressure 131/66, pulse 65, temperature 98.3 F (36.8 C), temperature source Oral, resp. rate 14, height 5\' 10"  (1.778 m), weight 110.2 kg (242 lb 15.2 oz), SpO2 97 %. General appearance: alert, cooperative and appears stated age Eyes: negative Nose: Nares normal. Septum midline. Mucosa normal. No drainage or sinus tenderness. Throat: lips, mucosa, and tongue normal; teeth and gums normal Neck: supple, symmetrical, trachea midline Back: symmetric, no curvature. ROM normal. No CVA tenderness. Resp: non-labored Cardio: Nl rate Male genitalia: normal, stent tether fashioned to dorsal penis Extremities: extremities normal, atraumatic, no cyanosis or edema Pulses: 2+ and symmetric Skin: Skin color, texture, turgor normal. No rashes or lesions Neurologic: Grossly normal  Disposition: 01-Home or Self Care     Medication List    STOP taking these medications        HYDROcodone-acetaminophen 5-325 MG per tablet   Commonly known as:  NORCO/VICODIN      TAKE these medications        ondansetron 4 MG tablet  Commonly known as:  ZOFRAN  Take 1 tablet (4 mg total) by mouth every 6 (six) hours.     oxyCODONE-acetaminophen 5-325 MG per tablet  Commonly known as:  PERCOCET/ROXICET  Take 1-2 tablets by mouth every 6 (six) hours as needed for moderate pain or severe pain. Post-operatively     senna-docusate 8.6-50 MG per tablet  Commonly known as:  Senokot-S  Take 1 tablet by mouth 2 (two) times daily. While taking pain meds to prevent constipation     sulfamethoxazole-trimethoprim 800-160 MG per tablet  Commonly known as:  BACTRIM DS,SEPTRA DS  Take 1 tablet by mouth 2 (two) times daily. X 4 days to prevent post-op infection     tamsulosin 0.4 MG Caps capsule  Commonly known as:  FLOMAX  Take 1 capsule (0.4 mg total) by mouth daily.           Follow-up Information    [facility] Schedule an appointment as soon as possible for a visit in 1 week.   Specialty:  Urology   Contact information:   [address] [place] [phone] [phone]       Signed: Sebastian AcheMANNY, Maziah Keeling 01/28/2015, 6:50 AM

## 2015-01-28 NOTE — Discharge Instructions (Signed)
1 - You may have urinary urgency (bladder spasms) and bloody urine on / off with stent in place. This is normal.  2 - Call MD or go to ER for fever >102, severe pain / nausea / vomiting not relieved by medications, or acute change in medical status  3 - Remove tethered stent by pulling on string , then blue/white plastic tubing on Friday morning and discarding.

## 2015-03-11 ENCOUNTER — Ambulatory Visit (INDEPENDENT_AMBULATORY_CARE_PROVIDER_SITE_OTHER): Payer: BC Managed Care – PPO | Admitting: Cardiovascular Disease

## 2015-03-11 ENCOUNTER — Encounter: Payer: Self-pay | Admitting: Cardiovascular Disease

## 2015-03-11 VITALS — BP 142/84 | HR 63 | Ht 70.0 in | Wt 250.0 lb

## 2015-03-11 DIAGNOSIS — I480 Paroxysmal atrial fibrillation: Secondary | ICD-10-CM | POA: Diagnosis not present

## 2015-03-11 DIAGNOSIS — I4819 Other persistent atrial fibrillation: Secondary | ICD-10-CM | POA: Insufficient documentation

## 2015-03-11 NOTE — Assessment & Plan Note (Signed)
[name] is referred by Dr. Clovis Riley for evaluation of PAF. This was found on routine exam recently when the patient's pulse was noted to be irregular and EKG confirmed A. Fib. He is currently in sinus rhythm. He is completely asymptomatic from this. His CHA2DSVASC2 score is 0, and therefore I do not recommend oral anticoagulation. He is on low-dose aspirin. I do not see a need to obtain an event monitor or a 2-D echo at this time. I reassured Mr. Ketchum will see him back when necessary  .

## 2015-03-11 NOTE — Patient Instructions (Signed)
CONTINUE WITH CURRENT MEDICATIONS  Your physician recommends that you schedule a follow-up appointment ON AN AS NEEDED BASIS.

## 2015-03-11 NOTE — Progress Notes (Signed)
     03/11/2015 Vallery Ridge   03-31-1951  [id number]  Primary Physician [name], MD Primary Cardiologist: Runell Gess MD Roseanne Reno   HPI:  [name] is very pleasant 64 year old mildly overweight married African-American male father of 2 biologic and 2 stepchildren, grandfather to 10 grandchildren referred by Dr. [name] for evaluation of PAF. 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 He has no coronary vessel risk factors. He has never had a heart attack or stroke and denies chest pain or shortness of breath. On routine exam recently Dr. Clovis Riley noted an irregular heartbeat and confirmed PAF on a 12-lead cardiogram. Patient was referred for evaluation of this.   Current Outpatient Prescriptions  Medication Sig Dispense Refill  . aspirin 325 MG tablet Take 325 mg by mouth daily.    . Naproxen Sodium (ALEVE) 220 MG CAPS Take 220 mg by mouth as needed.     No current facility-administered medications for this visit.    Allergies  Allergen Reactions  . Hornet Venom Anaphylaxis, Hives and Swelling    Swelling all over   . Codeine Nausea And Vomiting    Social History   Social History  . Marital Status: Married    Spouse Name: N/A  . Number of Children: N/A  . Years of Education: N/A   Occupational History  . Not on file.   Social History Main Topics  . Smoking status: Never Smoker   . Smokeless tobacco: Never Used  . Alcohol Use: No  . Drug Use: No  . Sexual Activity: Not on file   Other Topics Concern  . Not on file   Social History Narrative     Review of Systems: General: negative for chills, fever, night sweats or weight changes.  Cardiovascular: negative for chest pain, dyspnea on exertion, edema, orthopnea, palpitations, paroxysmal nocturnal dyspnea or shortness of breath Dermatological: negative for rash Respiratory: negative for cough or wheezing Urologic: negative for hematuria Abdominal: negative for  nausea, vomiting, diarrhea, bright red blood per rectum, melena, or hematemesis Neurologic: negative for visual changes, syncope, or dizziness All other systems reviewed and are otherwise negative except as noted above.    Blood pressure 142/84, pulse 63, height  (1.778 m), weight 250 lb (113.399 kg).  General appearance: alert and no distress Neck: no adenopathy, no carotid bruit, no JVD, supple, symmetrical, trachea midline and thyroid not enlarged, symmetric, no tenderness/mass/nodules Lungs: clear to auscultation bilaterally Heart: regular rate and rhythm, S1, S2 normal, no murmur, click, rub or gallop Extremities: extremities normal, atraumatic, no cyanosis or edema  EKG normal sinus rhythm at 63 without ST or T-wave changes. I personally reviewed this EKG  ASSESSMENT AND PLAN:   Paroxysmal atrial fibrillation [name] is referred by Dr. Clovis Riley for evaluation of PAF. This was found on routine exam recently when the patient's pulse was noted to be irregular and EKG confirmed A. Fib. He is currently in sinus rhythm. He is completely asymptomatic from this. His CHA2DSVASC2 score is 0, and therefore I do not recommend oral anticoagulation. He is on low-dose aspirin. I do not see a need to obtain an event monitor or a 2-D echo at this time. I reassured [name] will see him back when necessary  .      Runell Gess MD FACP,FACC,FAHA, [facility] 03/11/2015 12:27 PM

## 2015-03-25 ENCOUNTER — Ambulatory Visit: Payer: Self-pay | Admitting: Cardiology

## 2015-08-26 ENCOUNTER — Ambulatory Visit (INDEPENDENT_AMBULATORY_CARE_PROVIDER_SITE_OTHER): Payer: BC Managed Care – PPO | Admitting: Podiatry

## 2015-08-26 ENCOUNTER — Encounter: Payer: Self-pay | Admitting: Podiatry

## 2015-08-26 VITALS — BP 125/78 | HR 90 | Resp 16 | Ht 70.0 in | Wt 245.0 lb

## 2015-08-26 DIAGNOSIS — B351 Tinea unguium: Secondary | ICD-10-CM | POA: Diagnosis not present

## 2015-08-26 DIAGNOSIS — L600 Ingrowing nail: Secondary | ICD-10-CM | POA: Diagnosis not present

## 2015-08-26 NOTE — Progress Notes (Signed)
   Subjective:    Patient ID: Phillip Shannon, male    DOB: Apr 15, 1951, 65 y.o.   MRN: [id number]  HPI Patient presents with a nail problem on their right foot; great toe; nail discoloration, thickness of nail; x10 yrs.   Review of Systems  All other systems reviewed and are negative.      Objective:   Physical Exam        Assessment & Plan:

## 2015-08-26 NOTE — Progress Notes (Signed)
Subjective:     Patient ID: Phillip Shannon, male   DOB: May 14, 1951, 65 y.o.   MRN: [id number]  HPI patient states I have had severe thickness of my right big toenail I cannot cut it it gets sore and I know I need to have it removed long-term. Patient is a bus driver   Review of Systems  All other systems reviewed and are negative.      Objective:   Physical Exam  Constitutional: Phillip Shannon is oriented to person, place, and time.  Cardiovascular: Intact distal pulses.   Musculoskeletal: Normal range of motion.  Neurological: Phillip Shannon is oriented to person, place, and time.  Skin: Skin is warm and dry.  Nursing note and vitals reviewed.  neurovascular status found to be intact muscle strength adequate range of motion within normal limits with patient found to having severe thickness and damage to the right hallux nail that is dystrophic and is causing irritation and pressure against the underlying toe itself. Good digital perfusion and well oriented 3     Assessment:     Damaged right hallux nail long-term nature with rams horn appearance and pain    Plan:     H&P and condition reviewed with patient. At this point I do think it'll need to be removed permanently and I explained procedure and patient's going to do this but on a Friday. I reviewed the procedure itself and at this time patient did have the nail trimmed completely which gives temporary relief of symptoms

## 2015-08-26 NOTE — Patient Instructions (Signed)

## 2015-09-11 ENCOUNTER — Ambulatory Visit: Payer: BC Managed Care – PPO | Admitting: Podiatry

## 2015-09-25 ENCOUNTER — Ambulatory Visit: Payer: BC Managed Care – PPO | Admitting: Podiatry

## 2016-01-13 DIAGNOSIS — E669 Obesity, unspecified: Secondary | ICD-10-CM | POA: Diagnosis not present

## 2016-01-13 DIAGNOSIS — Z125 Encounter for screening for malignant neoplasm of prostate: Secondary | ICD-10-CM | POA: Diagnosis not present

## 2016-01-13 DIAGNOSIS — Z1211 Encounter for screening for malignant neoplasm of colon: Secondary | ICD-10-CM | POA: Diagnosis not present

## 2016-01-13 DIAGNOSIS — Z23 Encounter for immunization: Secondary | ICD-10-CM | POA: Diagnosis not present

## 2016-01-13 DIAGNOSIS — L309 Dermatitis, unspecified: Secondary | ICD-10-CM | POA: Diagnosis not present

## 2016-01-13 DIAGNOSIS — E7800 Pure hypercholesterolemia, unspecified: Secondary | ICD-10-CM | POA: Diagnosis not present

## 2016-01-13 DIAGNOSIS — Z0000 Encounter for general adult medical examination without abnormal findings: Secondary | ICD-10-CM | POA: Diagnosis not present

## 2016-01-13 DIAGNOSIS — I4891 Unspecified atrial fibrillation: Secondary | ICD-10-CM | POA: Diagnosis not present

## 2016-01-13 MED FILL — TRIAMCINOLONE 0.5% CREAM: 0.5 | 10 days supply | Qty: 30 | Fill #0

## 2016-01-18 ENCOUNTER — Telehealth: Payer: Self-pay | Admitting: Cardiovascular Disease

## 2016-01-18 NOTE — Telephone Encounter (Signed)
[facility] for appointment on 02/16/16 with Dr Allyson SabalBerry.  [facility] for Dr Hazle CocaBerry's schedule on 02/16/16. lp

## 2016-01-29 ENCOUNTER — Encounter: Payer: Self-pay | Admitting: Cardiovascular Disease

## 2016-01-29 ENCOUNTER — Telehealth: Payer: Self-pay | Admitting: Cardiovascular Disease

## 2016-02-03 NOTE — Telephone Encounter (Signed)
Closed encounter °

## 2016-02-15 DIAGNOSIS — I4891 Unspecified atrial fibrillation: Secondary | ICD-10-CM | POA: Diagnosis not present

## 2016-02-15 DIAGNOSIS — S8390XA Sprain of unspecified site of unspecified knee, initial encounter: Secondary | ICD-10-CM | POA: Diagnosis not present

## 2016-02-16 ENCOUNTER — Ambulatory Visit: Payer: Self-pay | Admitting: Cardiovascular Disease

## 2016-03-04 ENCOUNTER — Ambulatory Visit (INDEPENDENT_AMBULATORY_CARE_PROVIDER_SITE_OTHER): Payer: BC Managed Care – PPO | Admitting: Cardiovascular Disease

## 2016-03-04 ENCOUNTER — Encounter: Payer: Self-pay | Admitting: Cardiovascular Disease

## 2016-03-04 VITALS — BP 118/88 | HR 80 | Ht 70.0 in | Wt 254.0 lb

## 2016-03-04 DIAGNOSIS — I480 Paroxysmal atrial fibrillation: Secondary | ICD-10-CM | POA: Diagnosis not present

## 2016-03-04 NOTE — Progress Notes (Signed)
     03/04/2016 Phillip RidgeArnold S Shannon   03-03-1951  [id number]  Primary Physician Phillip Carneyean Mitchell, MD Primary Cardiologist: Runell GessJonathan J Calyb Mcquarrie MD Phillip RenoFACP, FACC, FAHA, FSCAI  HPI:  [name] is very pleasant 65 year old mildly overweight married African-American male father of 2 biologic and 2 stepchildren, grandfather to 10 grandchildren referred by Dr. Lupe Carneyean Shannon for evaluation of PAF. I last saw him in the office 03/11/15. 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 He has no coronary vessel risk factors. He has never had a heart attack or stroke and denies chest pain or shortness of breath. On routine exam recently Dr. Clovis Shannon noted an irregular heartbeat and confirmed PAF on a 12-lead cardiogram. [name] is in A. fib with controlled ventricular response today.The CHA2DSVASC2 score is  1 . He denies chest pain, shortness of breath or dizziness.  Current Outpatient Prescriptions  Medication Sig Dispense Refill  . aspirin 325 MG tablet Take 325 mg by mouth daily.     No current facility-administered medications for this visit.     Allergies  Allergen Reactions  . Hornet Venom Anaphylaxis, Hives and Swelling    Swelling all over   . Codeine Nausea And Vomiting    Social History   Social History  . Marital status: Married    Spouse name: N/A  . Number of children: N/A  . Years of education: N/A   Occupational History  . Not on file.   Social History Main Topics  . Smoking status: Never Smoker  . Smokeless tobacco: Never Used  . Alcohol use No  . Drug use: No  . Sexual activity: Not on file   Other Topics Concern  . Not on file   Social History Narrative  . No narrative on file     Review of Systems: General: negative for chills, fever, night sweats or weight changes.  Cardiovascular: negative for chest pain, dyspnea on exertion, edema, orthopnea, palpitations, paroxysmal nocturnal dyspnea or shortness of breath Dermatological: negative for rash Respiratory:  negative for cough or wheezing Urologic: negative for hematuria Abdominal: negative for nausea, vomiting, diarrhea, bright red blood per rectum, melena, or hematemesis Neurologic: negative for visual changes, syncope, or dizziness All other systems reviewed and are otherwise negative except as noted above.    Blood pressure 118/88, pulse 80, height 5\' 10"  (1.778 m), weight 254 lb (115.2 kg).  General appearance: alert and no distress Neck: no adenopathy, no carotid bruit, no JVD, supple, symmetrical, trachea midline and thyroid not enlarged, symmetric, no tenderness/mass/nodules Lungs: clear to auscultation bilaterally Heart: irregularly irregular rhythm Extremities: extremities normal, atraumatic, no cyanosis or edema  EKG atrial fibrillation with a ventricular response of 80. I personally reviewed  this EKG  ASSESSMENT AND PLAN:   Paroxysmal atrial fibrillation History of proximal atrial fibrillation with a CHA2DS2-VASc score of 1. He is asymptomatic and rate controlled on full dose aspirin. At this point, I do not feel he requires oral anticoagulation given his low stroke risk.      Runell GessJonathan J. Phillip Nauta MD FACP,FACC,FAHA, [facility] 03/04/2016 9:10 AM

## 2016-03-04 NOTE — Assessment & Plan Note (Signed)
History of proximal atrial fibrillation with a CHA2DS2-VASc score of 1. He is asymptomatic and rate controlled on full dose aspirin. At this point, I do not feel he requires oral anticoagulation given his low stroke risk.

## 2016-03-04 NOTE — Patient Instructions (Signed)

## 2016-06-13 ENCOUNTER — Ambulatory Visit (INDEPENDENT_AMBULATORY_CARE_PROVIDER_SITE_OTHER): Payer: Self-pay | Admitting: Physician Assistant

## 2016-06-13 VITALS — BP 130/80 | HR 94 | Temp 98.4°F | Resp 17 | Ht 70.0 in | Wt 260.0 lb

## 2016-06-13 DIAGNOSIS — Z024 Encounter for examination for driving license: Secondary | ICD-10-CM

## 2016-06-13 NOTE — Progress Notes (Signed)
Commercial Driver Medical Examination   Phillip Shannon is a 65 y.o. male who presents today for a commercial driver fitness determination physical exam. The patient reports no problems. The following portions of the patient's history were reviewed and updated as appropriate: allergies, current medications, past family history, past medical history, past social history, past surgical history and problem list.  He has paroxysmal atrial fibrillation, found incidentally on exam by PCP and evaluated by cardiology. Asymptomatic. CHADS 1. Anticoagulation with ASA 325 mg daily. Considered low risk for sudden incapacitation.   Review of Systems A comprehensive review of systems was negative.   Objective:    Vision:  Uncorrected Corrected Horizontal Field of Vision  Right Eye patient declines measurement 20/15 85 degrees  Left Eye  patient declines measurement 20/20 85 degrees  Both Eyes  patient declines measurement 20/20    Applicant can recognize and distinguish among traffic control signals and devices showing standard red, green, and amber colors.  Applicant meets visual acuity requirement only when wearing corrective lenses.  Monocular Vision?: No   Hearing:  Able to hear forced whisper at 10 feet, both ears.    BP 130/80 (BP Location: Right Arm, Patient Position: Sitting, Cuff Size: Normal)   Pulse 94   Temp 98.4 F (36.9 C) (Oral)   Resp 17   Ht 5\' 10"  (1.778 m)   Wt 260 lb (117.9 kg)   SpO2 98%   BMI 37.31 kg/m   General Appearance:    Alert, cooperative, no distress, appears stated age  Head:    Normocephalic, without obvious abnormality, atraumatic  Eyes:    PERRL, conjunctiva/corneas clear, EOM's intact, fundi    benign, both eyes       Ears:    Normal TM's and external ear canals, both ears  Nose:   Nares normal, septum midline, mucosa normal, no drainage    or sinus tenderness  Throat:   Lips, mucosa, and tongue normal; teeth and gums normal  Neck:   Supple,  symmetrical, trachea midline, no adenopathy;       thyroid:  No enlargement/tenderness/nodules; no carotid   bruit or JVD  Back:     Symmetric, no curvature, ROM normal, no CVA tenderness  Lungs:     Clear to auscultation bilaterally, respirations unlabored  Chest wall:    No tenderness or deformity  Heart:    Regular rate and irregular rhythm, S1 and S2 normal, no murmur, rub   or gallop  Abdomen:     Soft, non-tender, bowel sounds active all four quadrants,    no masses, no organomegaly  Genitalia:    Normal male without lesion, discharge or tenderness  Rectal:    Normal tone, normal prostate, no masses or tenderness;   guaiac negative stool  Extremities:   Extremities normal, atraumatic, no cyanosis or edema  Pulses:   2+ and symmetric all extremities  Skin:   Skin color, texture, turgor normal, no rashes or lesions  Lymph nodes:   Cervical, supraclavicular, and axillary nodes normal  Neurologic:   CNII-XII intact. Normal strength, sensation and reflexes      throughout     Labs:Urine dipstick shows negative for all components, negative for glucose, protein, urobilinogen. Trace blood.     Assessment:    Healthy male exam.  Meets standards, but periodic monitoring required due to Atrial fibrillation.  Driver qualified only for 1 year.    Plan:    Medical examiners certificate completed and printed. Return as needed.

## 2016-06-13 NOTE — Patient Instructions (Signed)
     IF you received an x-ray today, you will receive an invoice from [facility]. Please contact [facility] at [phone] with questions or concerns regarding your invoice.   IF you received labwork today, you will receive an invoice from [facility]. Please contact [facility] at [phone] with questions or concerns regarding your invoice.   Our billing staff will not be able to assist you with questions regarding bills from these companies.  You will be contacted with the lab results as soon as they are available. The fastest way to get your results is to activate your My Chart account. Instructions are located on the last page of this paperwork. If you have not heard from us regarding the results in 2 weeks, please contact this office.      

## 2016-06-21 DIAGNOSIS — M7070 Other bursitis of hip, unspecified hip: Secondary | ICD-10-CM | POA: Diagnosis not present

## 2016-07-07 ENCOUNTER — Ambulatory Visit (INDEPENDENT_AMBULATORY_CARE_PROVIDER_SITE_OTHER): Payer: BC Managed Care – PPO | Admitting: Orthopaedic Surgery

## 2016-07-28 ENCOUNTER — Encounter (INDEPENDENT_AMBULATORY_CARE_PROVIDER_SITE_OTHER): Payer: Self-pay | Admitting: Orthopaedic Surgery

## 2016-07-28 ENCOUNTER — Ambulatory Visit (INDEPENDENT_AMBULATORY_CARE_PROVIDER_SITE_OTHER): Payer: PPO

## 2016-07-28 ENCOUNTER — Ambulatory Visit (INDEPENDENT_AMBULATORY_CARE_PROVIDER_SITE_OTHER): Payer: PPO | Admitting: Orthopaedic Surgery

## 2016-07-28 DIAGNOSIS — M25551 Pain in right hip: Secondary | ICD-10-CM

## 2016-07-28 MED ORDER — DICLOFENAC SODIUM 75 MG PO TBEC: 75.0000 mg | DELAYED_RELEASE_TABLET | Freq: Two times a day (BID) | ORAL | 2 refills | Status: DC

## 2016-07-28 MED FILL — DICLOFENAC SOD 75 MG TAB EC: 75 | 15 days supply | Qty: 30 | Fill #0

## 2016-07-28 NOTE — Progress Notes (Signed)
Office Visit Note   Patient: Phillip Shannon           Date of Birth: Jan 02, 1951           MRN: [id number] Visit Date: 07/28/2016              Requested by: Maurice Small, MD [address] [place], [place] [address] PCP: [name], MD   Assessment & Plan: Visit Diagnoses:  1. Pain in right hip     Plan: Impression is right hip trochanteric IT band syndrome. I demonstrated exercises in the office today for stretching. Scheduled diclofenac for 2 weeks then as needed. Does not bother him bad enough or consistently enough to warrant an injection at this point  Follow-Up Instructions: Return if symptoms worsen or fail to improve.   Orders:  Orders Placed This Encounter  Procedures  . XR HIP UNILAT W OR W/O PELVIS 2-3 VIEWS RIGHT   Meds ordered this encounter  Medications  . diclofenac (VOLTAREN) 75 MG EC tablet    Sig: Take 1 tablet (75 mg total) by mouth 2 (two) times daily.    Dispense:  30 tablet    Refill:  2      Procedures: No procedures performed   Clinical Data: No additional findings.   Subjective: Chief Complaint  Patient presents with  . Right Hip - Pain    Patient is a 66 year old gentleman with six-week history of right hip pain that radiates from the lateral aspect down into the knee. He states that it is worse with sleeping on the right side. He denies any groin pain or buttock pain. Denies any radicular symptoms. Pain is worse with lying on and better with walking. Aleve does help. He is a school bus driver. Denies any numbness or tingling or weakness. Pain is described as a nagging and dull.    Review of Systems Complete review of systems is negative except for history of present illness  Objective: Vital Signs: There were no vitals taken for this visit.  Physical Exam Well-developed nourished acute distress alert 3 no labored breathing normal judgments affect abdomen soft no lymphadenopathy Ortho Exam Exam of the right  hip shows painless range of motion of the hip. Negative Stinchfield sign. No sciatic tension signs. He is tender over the lateral trochanteric region. No pain over the SI joint. Specialty Comments:  No specialty comments available.  Imaging: Xr Hip Unilat W Or W/o Pelvis 2-3 Views Right  Result Date: 07/28/2016 No significant DJD of hips    PMFS History: Patient Active Problem List   Diagnosis Date Noted  . Pain in right hip 07/28/2016  . Paroxysmal atrial fibrillation (HCC) 03/11/2015  . Renal colic on left side 01/27/2015  . Left ureteral stone 01/27/2015  . Ureteral stone 12/30/2013  . Renal calculus 04/23/2012   Past Medical History:  Diagnosis Date  . Arthritis   . Kidney stone   . Paroxysmal atrial fibrillation (HCC)   . PONV (postoperative nausea and vomiting)     Family History  Problem Relation Age of Onset  . Alcohol abuse Father   . Alcohol abuse Brother   . Heart disease Sister     aorta    Past Surgical History:  Procedure Laterality Date  . BACK SURGERY    . CYSTOSCOPY WITH RETROGRADE PYELOGRAM, URETEROSCOPY AND STENT PLACEMENT Right 12/31/2013   Procedure: CYSTOSCOPY WITH RETROGRADE PYELOGRAM, URETEROSCOPY AND STONE REMOVAL;  Surgeon: Kathi Ludwig, MD;  Location: [facility];  Service: Urology;  Laterality: Right;  . CYSTOSCOPY WITH RETROGRADE PYELOGRAM, URETEROSCOPY AND STENT PLACEMENT Left 01/27/2015   Procedure: CYSTOSCOPY WITH LEFT RETROGRADE PYELOGRAM, LEFT  SEMI-RIGID AND DIGITAL  URETEROSCOPY AND LEFT  STENT PLACEMENT STONE EXTRACTION;  Surgeon: Sebastian Acheheodore Manny, MD;  Location: [facility];  Service: Urology;  Laterality: Left;  . EYE SURGERY Right 07/2013  . HOLMIUM LASER APPLICATION Left 01/27/2015   Procedure: HOLMIUM LASER APPLICATION;  Surgeon: Sebastian Acheheodore Manny, MD;  Location: [facility];  Service: Urology;  Laterality: Left;  . LITHOTRIPSY     Social History   Occupational History  . school bus driver    Social History Main Topics  . Smoking status:  Never Smoker  . Smokeless tobacco: Never Used  . Alcohol use No  . Drug use: No  . Sexual activity: Not on file

## 2016-09-01 DIAGNOSIS — J069 Acute upper respiratory infection, unspecified: Secondary | ICD-10-CM | POA: Diagnosis not present

## 2016-09-01 DIAGNOSIS — N529 Male erectile dysfunction, unspecified: Secondary | ICD-10-CM | POA: Diagnosis not present

## 2016-09-14 DIAGNOSIS — R350 Frequency of micturition: Secondary | ICD-10-CM | POA: Diagnosis not present

## 2016-09-14 DIAGNOSIS — N401 Enlarged prostate with lower urinary tract symptoms: Secondary | ICD-10-CM | POA: Diagnosis not present

## 2016-09-14 DIAGNOSIS — N200 Calculus of kidney: Secondary | ICD-10-CM | POA: Diagnosis not present

## 2017-02-07 ENCOUNTER — Other Ambulatory Visit: Payer: Self-pay | Admitting: Family Medicine

## 2017-02-07 ENCOUNTER — Ambulatory Visit
Admission: RE | Admit: 2017-02-07 | Discharge: 2017-02-07 | Disposition: A | Discharge status: Home / Self Care | Payer: PPO | Source: Ambulatory Visit | Attending: Family Medicine | Admitting: Family Medicine

## 2017-02-07 DIAGNOSIS — M79601 Pain in right arm: Secondary | ICD-10-CM

## 2017-02-07 DIAGNOSIS — Z1211 Encounter for screening for malignant neoplasm of colon: Secondary | ICD-10-CM | POA: Diagnosis not present

## 2017-02-07 DIAGNOSIS — E669 Obesity, unspecified: Secondary | ICD-10-CM | POA: Diagnosis not present

## 2017-02-07 DIAGNOSIS — I4891 Unspecified atrial fibrillation: Secondary | ICD-10-CM | POA: Diagnosis not present

## 2017-02-07 DIAGNOSIS — Z0000 Encounter for general adult medical examination without abnormal findings: Secondary | ICD-10-CM | POA: Diagnosis not present

## 2017-02-07 DIAGNOSIS — N529 Male erectile dysfunction, unspecified: Secondary | ICD-10-CM | POA: Diagnosis not present

## 2017-02-07 DIAGNOSIS — E7800 Pure hypercholesterolemia, unspecified: Secondary | ICD-10-CM | POA: Diagnosis not present

## 2017-02-07 DIAGNOSIS — Z125 Encounter for screening for malignant neoplasm of prostate: Secondary | ICD-10-CM | POA: Diagnosis not present

## 2017-02-07 DIAGNOSIS — Z23 Encounter for immunization: Secondary | ICD-10-CM | POA: Diagnosis not present

## 2017-02-07 DIAGNOSIS — M79621 Pain in right upper arm: Secondary | ICD-10-CM | POA: Diagnosis not present

## 2017-03-07 DIAGNOSIS — Z1211 Encounter for screening for malignant neoplasm of colon: Secondary | ICD-10-CM | POA: Diagnosis not present

## 2017-05-23 DIAGNOSIS — R972 Elevated prostate specific antigen [PSA]: Secondary | ICD-10-CM | POA: Diagnosis not present

## 2017-05-23 DIAGNOSIS — Z23 Encounter for immunization: Secondary | ICD-10-CM | POA: Diagnosis not present

## 2017-06-30 DIAGNOSIS — J069 Acute upper respiratory infection, unspecified: Secondary | ICD-10-CM | POA: Diagnosis not present

## 2017-06-30 DIAGNOSIS — R05 Cough: Secondary | ICD-10-CM | POA: Diagnosis not present

## 2017-06-30 DIAGNOSIS — J9801 Acute bronchospasm: Secondary | ICD-10-CM | POA: Diagnosis not present

## 2017-07-10 DIAGNOSIS — R509 Fever, unspecified: Secondary | ICD-10-CM | POA: Diagnosis not present

## 2017-07-10 DIAGNOSIS — R05 Cough: Secondary | ICD-10-CM | POA: Diagnosis not present

## 2017-07-10 MED FILL — PROMETHAZINE-DM SYRUP: 6.25-15 | 10 days supply | Qty: 200 | Fill #0

## 2017-07-17 ENCOUNTER — Ambulatory Visit (INDEPENDENT_AMBULATORY_CARE_PROVIDER_SITE_OTHER): Payer: PPO | Admitting: Cardiology

## 2017-07-17 ENCOUNTER — Other Ambulatory Visit: Payer: Self-pay

## 2017-07-17 ENCOUNTER — Ambulatory Visit (HOSPITAL_COMMUNITY): Payer: PPO | Attending: Cardiovascular Disease

## 2017-07-17 ENCOUNTER — Encounter: Payer: Self-pay | Admitting: Cardiology

## 2017-07-17 VITALS — BP 116/88 | HR 86 | Ht 70.0 in | Wt 249.8 lb

## 2017-07-17 DIAGNOSIS — E669 Obesity, unspecified: Secondary | ICD-10-CM | POA: Insufficient documentation

## 2017-07-17 DIAGNOSIS — Z8679 Personal history of other diseases of the circulatory system: Secondary | ICD-10-CM | POA: Diagnosis not present

## 2017-07-17 DIAGNOSIS — G4730 Sleep apnea, unspecified: Secondary | ICD-10-CM | POA: Diagnosis not present

## 2017-07-17 DIAGNOSIS — R5383 Other fatigue: Secondary | ICD-10-CM

## 2017-07-17 DIAGNOSIS — I351 Nonrheumatic aortic (valve) insufficiency: Secondary | ICD-10-CM | POA: Diagnosis not present

## 2017-07-17 DIAGNOSIS — G4719 Other hypersomnia: Secondary | ICD-10-CM | POA: Diagnosis not present

## 2017-07-17 DIAGNOSIS — I517 Cardiomegaly: Secondary | ICD-10-CM | POA: Diagnosis not present

## 2017-07-17 DIAGNOSIS — R0683 Snoring: Secondary | ICD-10-CM

## 2017-07-17 DIAGNOSIS — G4733 Obstructive sleep apnea (adult) (pediatric): Secondary | ICD-10-CM

## 2017-07-17 DIAGNOSIS — I481 Persistent atrial fibrillation: Secondary | ICD-10-CM

## 2017-07-17 DIAGNOSIS — Z6835 Body mass index (BMI) 35.0-35.9, adult: Secondary | ICD-10-CM | POA: Diagnosis not present

## 2017-07-17 DIAGNOSIS — I4819 Other persistent atrial fibrillation: Secondary | ICD-10-CM

## 2017-07-17 DIAGNOSIS — I77810 Thoracic aortic ectasia: Secondary | ICD-10-CM | POA: Insufficient documentation

## 2017-07-17 DIAGNOSIS — E66811 Obesity, class 1: Secondary | ICD-10-CM | POA: Insufficient documentation

## 2017-07-17 LAB — TSH: TSH: 1.48 u[IU]/mL (ref 0.450–4.500)

## 2017-07-17 NOTE — Assessment & Plan Note (Signed)
Pt has a history of AF- previously asymptomatic. CHADS VASC 1- not on anticoagulation

## 2017-07-17 NOTE — Progress Notes (Signed)
07/17/2017 [facility]   Aug 10, 1950  [id number]  Primary Physician Clovis RileyMitchell, L.August Saucerean, MD Primary Cardiologist: Dr Allyson SabalBerry  HPI:  66 y/o AA male referred to Dr Allyson SabalBerry in the past by Lupe Carneyean Mitchell for evaluation of PAF. 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 He has no coronary vessel risk factors. He has never had a heart attack or stroke and denies chest pain or shortness of breath. On a routine exam Dr. Clovis RileyMitchell noted an irregular heartbeat and confirmed PAF on a 12-lead cardiogram. He has previously felt to be asymptomatic.The CHA2DSVASC2 score is 1 and Dr Allyson SabalBerry recommended ASA.  The pt is in the office today with complaints of fatigue. This has been present over th past year. He remains unaware of his AF. His wife accompanied him to day. The pt says he was recently seen at an urgent care for a URI and they noted an enlarged heart on CXR. The pt denies orthopnea or unusual dyspnea but admits to snoring. He says he feels like he is not rested when he wakes up. His wife confirms he snores and has period of apnea..     Current Outpatient Medications  Medication Sig Dispense Refill  . aspirin 325 MG tablet Take 325 mg by mouth daily.     No current facility-administered medications for this visit.     Allergies  Allergen Reactions  . Hornet Venom Anaphylaxis, Hives and Swelling    Swelling all over   . Codeine Nausea And Vomiting    Past Medical History:  Diagnosis Date  . Arthritis   . Kidney stone   . Paroxysmal atrial fibrillation (HCC)   . PONV (postoperative nausea and vomiting)     Social History   Socioeconomic History  . Marital status: Married    Spouse name: Not on file  . Number of children: 2  . Years of education: Not on file  . Highest education level: Not on file  Social Needs  . Financial resource strain: Not on file  . Food insecurity - worry: Not on file  . Food insecurity - inability: Not on file  . Transportation needs -  medical: Not on file  . Transportation needs - non-medical: Not on file  Occupational History  . Occupation: school bus driver  Tobacco Use  . Smoking status: Never Smoker  . Smokeless tobacco: Never Used  Substance and Sexual Activity  . Alcohol use: No  . Drug use: No  . Sexual activity: Not on file  Other Topics Concern  . Not on file  Social History Narrative   Lives with his wife.   Adult children do not live locally.     Family History  Problem Relation Age of Onset  . Alcohol abuse Father   . Alcohol abuse Brother   . Heart disease Sister        aorta     Review of Systems: General: negative for chills, fever, night sweats or weight changes.  Cardiovascular: negative for chest pain, dyspnea on exertion, edema, orthopnea, palpitations, paroxysmal nocturnal dyspnea or shortness of breath Dermatological: negative for rash Respiratory: negative for cough or wheezing Urologic: negative for hematuria Abdominal: negative for nausea, vomiting, diarrhea, bright red blood per rectum, melena, or hematemesis Neurologic: negative for visual changes, syncope, or dizziness All other systems reviewed and are otherwise negative except as noted above.    Blood pressure 116/88, pulse 86, height 5\' 10"  (1.778 m), weight 249 lb 12.8 oz (113.3  kg).  General appearance: alert, cooperative, no distress and moderately obese Neck: no carotid bruit and no JVD Lungs: clear to auscultation bilaterally Heart: irregularly irregular rhythm Abdomen: obese,  non tender Extremities: extremities normal, atraumatic, no cyanosis or edema Skin: Skin color, texture, turgor normal. No rashes or lesions Neurologic: Grossly normal  EKG AF with CVR  ASSESSMENT AND PLAN:   Atrial fibrillation, persistent (HCC) Pt has a history of AF- previously asymptomatic. CHADS VASC 1- not on anticoagulation  Hx of cardiomegaly By recent CXR at an urgent care  Obesity (BMI 30.0-34.9) BMI 35  Sleep  apnea Suspected sleep apnea by history   PLAN  Check echo, check TSH, arrange for sleep study.  Corine ShelterLuke Tasia Liz PA-C 07/17/2017 9:17 AM

## 2017-07-17 NOTE — Patient Instructions (Addendum)
Medication Instructions:  Your physician recommends that you continue on your current medications as directed. Please refer to the Current Medication list given to you today.  Labwork: Your physician recommends that you return for lab work in: TODAY-TSH  Testing/Procedures: Your physician has requested that you have an echocardiogram. Echocardiography is a painless test that uses sound waves to create images of your heart. It provides your doctor with information about the size and shape of your heart and how well your heart's chambers and valves are working. This procedure takes approximately one hour. There are no restrictions for this procedure. [address]  Your physician has recommended that you have a sleep study. This test records several body functions during sleep, including: brain activity, eye movement, oxygen and carbon dioxide blood levels, heart rate and rhythm, breathing rate and rhythm, the flow of air through your mouth and nose, snoring, body muscle movements, and chest and belly movement.  Follow-Up: Your physician recommends that you schedule a follow-up appointment in: 6 WEEKS WITH DR Allyson SabalBERRY  Any Other Special Instructions Will Be Listed Below (If Applicable).  If you need a refill on your cardiac medications before your next appointment, please call your pharmacy.

## 2017-07-17 NOTE — Assessment & Plan Note (Signed)
BMI 35 

## 2017-07-17 NOTE — Assessment & Plan Note (Signed)
Suspected sleep apnea by history

## 2017-07-17 NOTE — Assessment & Plan Note (Signed)
By recent CXR at an urgent care

## 2017-07-18 LAB — ECHOCARDIOGRAM COMPLETE
Height: 70 in
Weight: 3996.8 oz

## 2017-07-19 DIAGNOSIS — I480 Paroxysmal atrial fibrillation: Secondary | ICD-10-CM | POA: Insufficient documentation

## 2017-07-28 ENCOUNTER — Ambulatory Visit: Payer: PPO | Admitting: Cardiovascular Disease

## 2017-08-16 ENCOUNTER — Encounter: Payer: Self-pay | Admitting: Cardiovascular Disease

## 2017-08-16 ENCOUNTER — Ambulatory Visit: Payer: PPO | Admitting: Cardiovascular Disease

## 2017-08-16 VITALS — BP 128/72 | HR 70 | Ht 70.0 in | Wt 251.8 lb

## 2017-08-16 DIAGNOSIS — I481 Persistent atrial fibrillation: Secondary | ICD-10-CM

## 2017-08-16 DIAGNOSIS — I4819 Other persistent atrial fibrillation: Secondary | ICD-10-CM

## 2017-08-16 DIAGNOSIS — G4733 Obstructive sleep apnea (adult) (pediatric): Secondary | ICD-10-CM | POA: Diagnosis not present

## 2017-08-16 NOTE — Patient Instructions (Signed)
Medication Instructions: Your physician recommends that you continue on your current medications as directed. Please refer to the Current Medication list given to you today.   Testing/Procedures: Your physician has recommended that you have an at home sleep study. This test records several body functions during sleep, including: brain activity, eye movement, oxygen and carbon dioxide blood levels, heart rate and rhythm, breathing rate and rhythm, the flow of air through your mouth and nose, snoring, body muscle movements, and chest and belly movement.  Follow-Up: Your physician wants you to follow-up in: 1 year with Dr. Allyson SabalBerry. You will receive a reminder letter in the mail two months in advance. If you don't receive a letter, please call our office to schedule the follow-up appointment.  If you need a refill on your cardiac medications before your next appointment, please call your pharmacy.

## 2017-08-16 NOTE — Assessment & Plan Note (Signed)
Symptoms of obstructive sleep apnea scheduling a home sleep study.

## 2017-08-16 NOTE — Progress Notes (Signed)
08/16/2017 [facility]   [birth year]  [id number]  Primary Physician Clovis RileyMitchell, L.August Saucerean, MD Primary Cardiologist: Runell GessJonathan J Anyelin Mogle MD Nicholes CalamityFACP, FACC, FAHA, [place]  HPI:  Phillip Shannon [place] is a 67 y.o.  mildly overweight married African-American male father of 2 biologic and 2 stepchildren, grandfather to 10 grandchildren referred by Dr. Lupe Carneyean Mitchell for evaluation of PAF. I last saw him in the office 03/04/16. 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 He has no coronary vessel risk factors. He has never had a heart attack or stroke and denies chest pain or shortness of breath. On routine exam recently Dr. Clovis RileyMitchell noted an irregular heartbeat and confirmed PAF on a 12-lead cardiogram. [name] is in A. fib with controlled ventricular response today.The CHA2DSVASC2 score is  1 . He denies chest pain, shortness of breath or dizziness. He did have a recent 2-D echocardiogram that was essentially normal and has a home sleep study scheduled for symptoms of obstructive sleep apnea.      No outpatient medications have been marked as taking for the 08/16/17 encounter (Office Visit) with Runell GessBerry, Chauntelle Azpeitia J, MD.     Allergies  Allergen Reactions  . Hornet Venom Anaphylaxis, Hives and Swelling    Swelling all over   . Codeine Nausea And Vomiting    Social History   Socioeconomic History  . Marital status: Married    Spouse name: Not on file  . Number of children: 2  . Years of education: Not on file  . Highest education level: Not on file  Social Needs  . Financial resource strain: Not on file  . Food insecurity - worry: Not on file  . Food insecurity - inability: Not on file  . Transportation needs - medical: Not on file  . Transportation needs - non-medical: Not on file  Occupational History  . Occupation: school bus driver  Tobacco Use  . Smoking status: Never Smoker  . Smokeless tobacco: Never Used  Substance and Sexual Activity  . Alcohol use: No  . Drug use: No    . Sexual activity: Not on file  Other Topics Concern  . Not on file  Social History Narrative   Lives with his wife.   Adult children do not live locally.     Review of Systems: General: negative for chills, fever, night sweats or weight changes.  Cardiovascular: negative for chest pain, dyspnea on exertion, edema, orthopnea, palpitations, paroxysmal nocturnal dyspnea or shortness of breath Dermatological: negative for rash Respiratory: negative for cough or wheezing Urologic: negative for hematuria Abdominal: negative for nausea, vomiting, diarrhea, bright red blood per rectum, melena, or hematemesis Neurologic: negative for visual changes, syncope, or dizziness All other systems reviewed and are otherwise negative except as noted above.    Blood pressure 128/72, pulse 70, height 5\' 10"  (1.778 m), weight 251 lb 12.8 oz (114.2 kg).  General appearance: alert and no distress Neck: no adenopathy, no carotid bruit, no JVD, supple, symmetrical, trachea midline and thyroid not enlarged, symmetric, no tenderness/mass/nodules Lungs: clear to auscultation bilaterally Heart: regular rate and rhythm, S1, S2 normal, no murmur, click, rub or gallop Extremities: extremities normal, atraumatic, no cyanosis or edema Pulses: 2+ and symmetric Skin: Skin color, texture, turgor normal. No rashes or lesions Neurologic: Alert and oriented X 3, normal strength and tone. Normal symmetric reflexes. Normal coordination and gait  EKG not performed today  ASSESSMENT AND PLAN:   Atrial fibrillation, persistent (HCC) History of paroxysmal atrial fibrillation  currently currently in sinus rhythm not on anticoagulation because of a CHADS VASC2 score of 1.  Sleep apnea Symptoms of obstructive sleep apnea scheduling a home sleep study.      Runell Gess MD FACP,FACC,FAHA, [facility] 08/16/2017 11:26 AM

## 2017-08-16 NOTE — Assessment & Plan Note (Signed)
History of paroxysmal atrial fibrillation currently currently in sinus rhythm not on anticoagulation because of a CHADS VASC2 score of 1.

## 2017-09-11 ENCOUNTER — Ambulatory Visit (HOSPITAL_BASED_OUTPATIENT_CLINIC_OR_DEPARTMENT_OTHER): Payer: PPO | Attending: Cardiovascular Disease | Admitting: Cardiovascular Disease

## 2017-09-11 DIAGNOSIS — I481 Persistent atrial fibrillation: Secondary | ICD-10-CM | POA: Insufficient documentation

## 2017-09-11 DIAGNOSIS — G4736 Sleep related hypoventilation in conditions classified elsewhere: Secondary | ICD-10-CM | POA: Diagnosis not present

## 2017-09-11 DIAGNOSIS — Z6836 Body mass index (BMI) 36.0-36.9, adult: Secondary | ICD-10-CM | POA: Insufficient documentation

## 2017-09-11 DIAGNOSIS — G4733 Obstructive sleep apnea (adult) (pediatric): Secondary | ICD-10-CM | POA: Insufficient documentation

## 2017-09-11 DIAGNOSIS — Z7982 Long term (current) use of aspirin: Secondary | ICD-10-CM | POA: Diagnosis not present

## 2017-09-19 DIAGNOSIS — J069 Acute upper respiratory infection, unspecified: Secondary | ICD-10-CM | POA: Diagnosis not present

## 2017-09-19 MED FILL — PROMETHAZINE W/COD SYRUP: 6.25-10 | 4 days supply | Qty: 120 | Fill #0

## 2017-09-23 ENCOUNTER — Encounter (HOSPITAL_BASED_OUTPATIENT_CLINIC_OR_DEPARTMENT_OTHER): Payer: Self-pay | Admitting: Cardiovascular Disease

## 2017-09-23 NOTE — Procedures (Signed)
     Patient Name: Phillip Shannon, Rudi Study Date: 09/11/2017 Gender: Male D.O.B: 1951-01-03 Age (years): 66 Referring Provider: Runell GessJonathan J Berry Height (inches): 70 Interpreting Physician: Nicki Guadalajarahomas Cadey Bazile MD, ABSM Weight (lbs): 248 RPSGT: Ulyess MortSpruill, Vicki BMI: 36 MRN: [id number] Neck Size: 18.50  CLINICAL INFORMATION Sleep Study Type: HST  Indication for sleep study: OSA  Epworth Sleepiness Score: 15  SLEEP STUDY TECHNIQUE A multi-channel overnight portable sleep study was performed. The channels recorded were: nasal airflow, thoracic respiratory movement, and oxygen saturation with a pulse oximetry. Snoring was also monitored.  MEDICATIONS aspirin 325 MG tablet ???????Patient self administered medications include: N/A.  SLEEP ARCHITECTURE Patient was studied for 369.4 minutes. The sleep efficiency was 100.0 % and the patient was supine for 39.8%. The arousal index was 0.0 per hour.  RESPIRATORY PARAMETERS The overall AHI was 56.0 per hour, with a central apnea index of 0.0 per hour.  The oxygen nadir was 75% during sleep.  CARDIAC DATA Mean heart rate during sleep was 79.6 bpm.  IMPRESSIONS - Severe obstructive sleep apnea occurred during this study (AHI = 56.0/h). - No significant central sleep apnea occurred during this study (CAI = 0.0/h). - Severe oxygen desaturation to a nadir of 75%. - Patient snored 27.7% during the sleep.   DIAGNOSIS - Obstructive Sleep Apnea (327.23 [G47.33 ICD-10]) - Nocturnal Hypoxemia (327.26 [G47.36 ICD-10])  RECOMMENDATIONS - In this patient with cardiovascular comorbidities including atrial fibrillation and severe sleep apnea with significant severe oxygen desaturation, recommend an in-lab CPAP titration study. - Efforts should be made to optimize nasal and oropharyngeal patency. - Positional therapy avoiding supine position during sleep. - Avoid alcohol, sedatives and other CNS depressants that may worsen sleep apnea and disrupt  normal sleep architecture. - Sleep hygiene should be reviewed to assess factors that may improve sleep quality. - Weight management (BMI 36) and regular exercise should be initiated. -   [Electronically signed] 09/23/2017 05:14 PM  Nicki Guadalajarahomas Mikala Podoll MD, Donneta RombergFACC,ABSM Diplomate, American Board of Sleep Medicine   NPI: [id number] [facility] PH: [phone]   FX: [phone] ACCREDITED BY THE AMERICAN ACADEMY OF SLEEP MEDICINE

## 2017-09-25 ENCOUNTER — Telehealth: Payer: Self-pay | Admitting: *Deleted

## 2017-09-25 ENCOUNTER — Other Ambulatory Visit: Payer: Self-pay | Admitting: Cardiovascular Disease

## 2017-09-25 DIAGNOSIS — I4819 Other persistent atrial fibrillation: Secondary | ICD-10-CM

## 2017-09-25 DIAGNOSIS — E669 Obesity, unspecified: Secondary | ICD-10-CM

## 2017-09-25 DIAGNOSIS — G4733 Obstructive sleep apnea (adult) (pediatric): Secondary | ICD-10-CM

## 2017-09-25 NOTE — Telephone Encounter (Signed)
Patient notified of HST results and recommendations. Informed patient that a PA to have a in-lab titration study has been sent via acuity web site for approval. Once this has been done, a in lab titration study or an auto titration study will be scheduled pending his insurance companies decision. He was made aware that it will usually takes his insurance company 14 days to render a decision. Patient voiced verbal understanding of everything that has been told to him during this call . He was advised to call back should further questions and/or concerns arise.

## 2017-09-25 NOTE — Telephone Encounter (Signed)
Sent PA request for in lab CPAP titration study.

## 2017-09-25 NOTE — Progress Notes (Signed)
09/25/17 patient notified of HST results and recommendations.

## 2017-09-25 NOTE — Telephone Encounter (Signed)
-----   Message from Lennette Biharihomas A Kelly, MD sent at 09/23/2017  5:21 PM EST ----- Burna MortimerWanda, please notify pt of the results and schedule for in lab CPAP titration.

## 2017-10-23 ENCOUNTER — Telehealth: Payer: Self-pay | Admitting: *Deleted

## 2017-10-23 NOTE — Telephone Encounter (Signed)
Patient notified of CPAP titration study appointment. [facility] contact information  Given to patient.

## 2017-11-07 ENCOUNTER — Ambulatory Visit (HOSPITAL_BASED_OUTPATIENT_CLINIC_OR_DEPARTMENT_OTHER): Payer: PPO | Attending: Cardiovascular Disease | Admitting: Cardiovascular Disease

## 2017-11-07 VITALS — Ht 70.0 in | Wt 248.0 lb

## 2017-11-07 DIAGNOSIS — G4730 Sleep apnea, unspecified: Secondary | ICD-10-CM | POA: Diagnosis present

## 2017-11-07 DIAGNOSIS — G4733 Obstructive sleep apnea (adult) (pediatric): Secondary | ICD-10-CM

## 2017-11-07 DIAGNOSIS — I4891 Unspecified atrial fibrillation: Secondary | ICD-10-CM | POA: Insufficient documentation

## 2017-11-07 DIAGNOSIS — E669 Obesity, unspecified: Secondary | ICD-10-CM | POA: Diagnosis not present

## 2017-11-07 DIAGNOSIS — I4819 Other persistent atrial fibrillation: Secondary | ICD-10-CM

## 2017-11-26 ENCOUNTER — Encounter (HOSPITAL_BASED_OUTPATIENT_CLINIC_OR_DEPARTMENT_OTHER): Payer: Self-pay | Admitting: Cardiovascular Disease

## 2017-11-26 NOTE — Procedures (Signed)
Patient Name: Phillip Shannon, Phillip Shannon Date: 11/07/2017 Gender: Male D.O.B: February 15, 1951 Age (years): 47 Referring Provider: Nicki Guadalajara MD, ABSM Height (inches): 70 Interpreting Physician: Nicki Guadalajara MD, ABSM Weight (lbs): 248 RPSGT: Shelah Lewandowsky BMI: 36 MRN: [id number] Neck Size: 18.00  CLINICAL INFORMATION The patient is referred for a CPAP titration to treat sleep apnea.  Date of HST: 09/11/2017: AHI 56/h; RDI 56/h; , oxygen desaturation to a nadir of 75%.  SLEEP STUDY TECHNIQUE As per the AASM Manual for the Scoring of Sleep and Associated Events v2.3 (April 2016) with a hypopnea requiring 4% desaturations.  The channels recorded and monitored were frontal, central and occipital EEG, electrooculogram (EOG), submentalis EMG (chin), nasal and oral airflow, thoracic and abdominal wall motion, anterior tibialis EMG, snore microphone, electrocardiogram, and pulse oximetry. Continuous positive airway pressure (CPAP) was initiated at the beginning of the study and titrated to treat sleep-disordered breathing.  MEDICATIONS aspirin 325 MG tablet ???????Medications self-administered by patient taken the night of the study : N/A  TECHNICIAN COMMENTS Comments added by technician: NONE  RESPIRATORY PARAMETERS Optimal PAP Pressure (cm): 13 AHI at Optimal Pressure (/hr): 0.0 Overall Minimal O2 (%): 90.0 Supine % at Optimal Pressure (%): 100 Minimal O2 at Optimal Pressure (%): 93.0   SLEEP ARCHITECTURE The study was initiated at 10:44:20 PM and ended at 5:19:29 AM.  Sleep onset time was 9.4 minutes and the sleep efficiency was 89.2%%. The total sleep time was 352.5 minutes.  The patient spent 8.1%% of the night in stage N1 sleep, 61.6%% in stage N2 sleep, 0.0%% in stage N3 and 30.35% in REM.Stage REM latency was 54.5 minutes  Wake after sleep onset was 33.3. Alpha intrusion was absent. Supine sleep was 74.75%.  CARDIAC DATA The 2 lead EKG demonstrated atrial fibrillation.  The mean heart rate was 80.5 beats per minute. Other EKG findings include: PVCs.  LEG MOVEMENT DATA The total Periodic Limb Movements of Sleep (PLMS) were 0. The PLMS index was 0.0. A PLMS index of <15 is considered normal in adults.  IMPRESSIONS - The optimal PAP pressure was 13 cm of water. At 13 cm water pressure, AHI was 0/h, RDI 3.7/h.  Oxygen desaturation to 93%. - Central sleep apnea was not noted during this titration (CAI = 0.2/h). - Significant oxygen desaturations were not observed during this titration; nadir 90% at 7 cwp. - The patient snored with soft snoring volume during this titration study. - 2-lead EKG demonstrated: PVCs - Clinically significant periodic limb movements were not noted during this study. Arousals associated with PLMs were rare.  DIAGNOSIS - Obstructive Sleep Apnea (327.23 [G47.33 ICD-10])  RECOMMENDATIONS - Recommend an initial trial of CPAP therapy with EPR of 3 at 13 cm H2O with heated humidification.  A Large size Fisher&Paykel Full Face Mask Simplus mask was used for the titration. - Effort should be made to optimize nasal and oral pharyngeal patency. - Avoid alcohol, sedatives and other CNS depressants that may worsen sleep apnea and disrupt normal sleep architecture. - Sleep hygiene should be reviewed to assess factors that may improve sleep quality. - Weight management and regular exercise should be initiated or continued. - Recommend a download be obtained in 30 days and a sleep clinic evaluation after after 4 weeks of therapy.   [Electronically signed] 11/26/2017 06:34 PM  Nicki Guadalajara MD, [facility], ABSM Diplomate, American Board of Sleep Medicine   NPI: [id number] [place] PH: [phone]   FX: [phone] ACCREDITED BY THE AMERICAN  ACADEMY OF SLEEP MEDICINE

## 2017-11-27 ENCOUNTER — Telehealth: Payer: Self-pay | Admitting: *Deleted

## 2017-11-27 NOTE — Telephone Encounter (Signed)
-----   Message from Thomas A KLennette Biharint at 11/26/2017  6:38 PM EDT ----- Phillip Shannon, please notify the patient the results and set up with DME company for CPAP set up at home.

## 2017-11-27 NOTE — Telephone Encounter (Signed)
Patient informed CPAP referral has been sent to [facility] for set up. If he has not heard back from within 2 weeks give me a call to notify so that I can check the status of the referral. Patient voiced his understanding.

## 2017-11-27 NOTE — Progress Notes (Signed)
Patient notified referral sent to [facility].

## 2017-12-06 DIAGNOSIS — G4733 Obstructive sleep apnea (adult) (pediatric): Secondary | ICD-10-CM | POA: Diagnosis not present

## 2017-12-07 ENCOUNTER — Telehealth: Payer: Self-pay | Admitting: *Deleted

## 2017-12-07 NOTE — Telephone Encounter (Signed)
CPAP machine and supply orders faxed to [facility].

## 2017-12-19 ENCOUNTER — Other Ambulatory Visit: Payer: Self-pay

## 2017-12-19 ENCOUNTER — Emergency Department (HOSPITAL_COMMUNITY)
Admission: EM | Admit: 2017-12-19 | Discharge: 2017-12-19 | Disposition: A | Discharge status: Home / Self Care | Payer: PPO | Attending: Emergency Medicine | Admitting: Emergency Medicine

## 2017-12-19 ENCOUNTER — Emergency Department (HOSPITAL_COMMUNITY): Payer: PPO

## 2017-12-19 ENCOUNTER — Encounter (HOSPITAL_COMMUNITY): Payer: Self-pay | Admitting: Emergency Medicine

## 2017-12-19 DIAGNOSIS — Y93H2 Activity, gardening and landscaping: Secondary | ICD-10-CM | POA: Diagnosis not present

## 2017-12-19 DIAGNOSIS — Z23 Encounter for immunization: Secondary | ICD-10-CM | POA: Insufficient documentation

## 2017-12-19 DIAGNOSIS — Z7982 Long term (current) use of aspirin: Secondary | ICD-10-CM | POA: Insufficient documentation

## 2017-12-19 DIAGNOSIS — S61311A Laceration without foreign body of left index finger with damage to nail, initial encounter: Secondary | ICD-10-CM | POA: Insufficient documentation

## 2017-12-19 DIAGNOSIS — Y999 Unspecified external cause status: Secondary | ICD-10-CM | POA: Diagnosis not present

## 2017-12-19 DIAGNOSIS — Y929 Unspecified place or not applicable: Secondary | ICD-10-CM | POA: Insufficient documentation

## 2017-12-19 DIAGNOSIS — S6982XA Other specified injuries of left wrist, hand and finger(s), initial encounter: Secondary | ICD-10-CM | POA: Diagnosis present

## 2017-12-19 DIAGNOSIS — S61221A Laceration with foreign body of left index finger without damage to nail, initial encounter: Secondary | ICD-10-CM | POA: Diagnosis not present

## 2017-12-19 DIAGNOSIS — W293XXA Contact with powered garden and outdoor hand tools and machinery, initial encounter: Secondary | ICD-10-CM | POA: Diagnosis not present

## 2017-12-19 MED ORDER — MORPHINE SULFATE (PF) 4 MG/ML IV SOLN
4.0000 mg | Freq: Once | INTRAVENOUS | Status: AC
  Administered 2017-12-19: 4 mg via INTRAVENOUS
  Filled 2017-12-19: qty 1

## 2017-12-19 MED ORDER — LIDOCAINE HCL 2 % IJ SOLN
15.0000 mL | Freq: Once | INTRAMUSCULAR | Status: AC
  Administered 2017-12-19: 300 mg via INTRADERMAL
  Filled 2017-12-19: qty 20

## 2017-12-19 MED ORDER — TETANUS-DIPHTH-ACELL PERTUSSIS 5-2.5-18.5 LF-MCG/0.5 IM SUSP
0.5000 mL | Freq: Once | INTRAMUSCULAR | Status: AC
  Administered 2017-12-19: 0.5 mL via INTRAMUSCULAR
  Filled 2017-12-19: qty 0.5

## 2017-12-19 MED ORDER — ONDANSETRON 4 MG PO TBDP
4.0000 mg | ORAL_TABLET | Freq: Once | ORAL | Status: AC
  Administered 2017-12-19: 4 mg via ORAL
  Filled 2017-12-19: qty 1

## 2017-12-19 MED ORDER — TRAMADOL HCL 50 MG PO TABS: 50.0000 mg | ORAL_TABLET | Freq: Two times a day (BID) | ORAL | 0 refills | Status: DC | PRN

## 2017-12-19 MED ORDER — CEPHALEXIN 500 MG PO CAPS: 500.0000 mg | ORAL_CAPSULE | Freq: Four times a day (QID) | ORAL | 0 refills | Status: AC

## 2017-12-19 MED ORDER — BACITRACIN ZINC 500 UNIT/GM EX OINT
TOPICAL_OINTMENT | Freq: Two times a day (BID) | CUTANEOUS | Status: DC
  Administered 2017-12-19: 1 via TOPICAL
  Filled 2017-12-19: qty 1.8

## 2017-12-19 MED FILL — CEPHALEXIN 500 MG CAPSULE: 500 | 5 days supply | Qty: 20 | Fill #0

## 2017-12-19 MED FILL — traMADol HCL 50 MG TABS: 50 | 3 days supply | Qty: 6 | Fill #0

## 2017-12-19 NOTE — ED Triage Notes (Signed)
Pt complaint of left index finger laceration while cutting hedges.

## 2017-12-19 NOTE — ED Provider Notes (Signed)
[facility]-EMERGENCY DEPT Provider Note   CSN: [id number] Arrival date & time: 12/19/17  1304     History   Chief Complaint Chief Complaint  Patient presents with  . Laceration    HPI Phillip Shannon is a 67 y.o. male.  HPI   67 year old male presenting the emergency department with left index finger laceration that occurred just prior to arrival while he was cutting hedges with a hedge trimmer.  Reports sudden onset of pain that has been constant.  Worse with palpation and movement.  Has not tried taking any medications for his symptoms.  Denies any other injuries.  No numbness or weakness to the finger.  Has full range of motion intact.  His tetanus shot is not up-to-date.  Past Medical History:  Diagnosis Date  . Arthritis   . Kidney stone   . Paroxysmal atrial fibrillation (HCC)   . PONV (postoperative nausea and vomiting)     Patient Active Problem List   Diagnosis Date Noted  . Paroxysmal atrial fibrillation (HCC)   . Hx of cardiomegaly 07/17/2017  . Obesity (BMI 30.0-34.9) 07/17/2017  . Sleep apnea 07/17/2017  . Pain in right hip 07/28/2016  . Atrial fibrillation, persistent (HCC) 03/11/2015  . Renal colic on left side 01/27/2015  . Left ureteral stone 01/27/2015  . Ureteral stone 12/30/2013  . Renal calculus 04/23/2012    Past Surgical History:  Procedure Laterality Date  . BACK SURGERY    . CYSTOSCOPY WITH RETROGRADE PYELOGRAM, URETEROSCOPY AND STENT PLACEMENT Right 12/31/2013   Procedure: CYSTOSCOPY WITH RETROGRADE PYELOGRAM, URETEROSCOPY AND STONE REMOVAL;  Surgeon: Kathi LudwigSigmund I Tannenbaum, MD;  Location: [facility];  Service: Urology;  Laterality: Right;  . CYSTOSCOPY WITH RETROGRADE PYELOGRAM, URETEROSCOPY AND STENT PLACEMENT Left 01/27/2015   Procedure: CYSTOSCOPY WITH LEFT RETROGRADE PYELOGRAM, LEFT  SEMI-RIGID AND DIGITAL  URETEROSCOPY AND LEFT  STENT PLACEMENT STONE EXTRACTION;  Surgeon: Sebastian Acheheodore Manny, MD;  Location: [facility];  Service:  Urology;  Laterality: Left;  . EYE SURGERY Right 07/2013  . HOLMIUM LASER APPLICATION Left 01/27/2015   Procedure: HOLMIUM LASER APPLICATION;  Surgeon: Sebastian Acheheodore Manny, MD;  Location: [facility];  Service: Urology;  Laterality: Left;  . LITHOTRIPSY          Home Medications    Prior to Admission medications   Medication Sig Start Date End Date Taking? Authorizing Provider  aspirin 325 MG tablet Take 325 mg by mouth daily.   Yes [provider]  cephALEXin (KEFLEX) 500 MG capsule Take 1 capsule (500 mg total) by mouth 4 (four) times daily for 5 days. 12/19/17 12/24/17  Daley Gosse S, PA-C    Family History Family History  Problem Relation Age of Onset  . Alcohol abuse Father   . Alcohol abuse Brother   . Heart disease Sister        aorta    Social History Social History   Tobacco Use  . Smoking status: Never Smoker  . Smokeless tobacco: Never Used  Substance Use Topics  . Alcohol use: No  . Drug use: No     Allergies   Hornet venom and Codeine   Review of Systems Review of Systems  Constitutional: Negative for fever.  Musculoskeletal:       Finger pain  Skin: Positive for wound.  Neurological: Negative for weakness and numbness.     Physical Exam Updated Vital Signs BP (!) 135/108 (BP Location: Left Arm)   Pulse 76   Temp 98.3 F (36.8 C) (  Oral)   Resp 16   SpO2 98%   Physical Exam  Constitutional: He is oriented to person, place, and time. He appears well-developed and well-nourished. No distress.  Eyes: Conjunctivae are normal.  Cardiovascular: Normal rate and regular rhythm.  Pulmonary/Chest: Effort normal and breath sounds normal.  Musculoskeletal:  Linear laceration through the nailbed and distal tip of the left index finger.  Bleeding well controlled.  No foreign body noted.  Flexion/extension intact at the DIP, PIP, MCP joint of the left index finger.  Nail is intact. Brisk cap refill distally.  Sensation intact distally.   Neurological:  He is alert and oriented to person, place, and time.  Skin: Skin is warm and dry.   ED Treatments / Results  Labs (all labs ordered are listed, but only abnormal results are displayed) Labs Reviewed - No data to display  EKG None  Radiology Dg Finger Index Left  Result Date: 12/19/2017 CLINICAL DATA:  Left index finger laceration. EXAM: LEFT INDEX FINGER 2+V COMPARISON:  None. FINDINGS: Small soft tissue defect at the tip of the index finger. No radiopaque foreign body. No acute fracture or dislocation. Joint spaces are preserved. IMPRESSION: 1. Soft tissue laceration at the tip of the index finger. No radiopaque foreign body. 2. No acute osseous abnormality. Electronically Signed   By: Obie Dredge M.D.   On: 12/19/2017 16:10    Procedures .Marland KitchenLaceration Repair Date/Time: 12/19/2017 4:58 PM Performed by: Karrie Meres, PA-C Authorized by: Karrie Meres, PA-C   Consent:    Consent obtained:  Verbal   Consent given by:  Patient   Risks discussed:  Infection and pain Anesthesia (see MAR for exact dosages):    Anesthesia method:  Local infiltration and nerve block   Local anesthetic:  Lidocaine 1% w/o epi   Block needle gauge:  25 G   Block anesthetic:  Lidocaine 1% w/o epi   Block injection procedure:  Anatomic landmarks identified, introduced needle, incremental injection and negative aspiration for blood Laceration details:    Location:  Finger   Finger location:  L index finger   Wound length (cm): 1. Repair type:    Repair type:  Simple Pre-procedure details:    Preparation:  Patient was prepped and draped in usual sterile fashion and imaging obtained to evaluate for foreign bodies Exploration:    Hemostasis achieved with:  Direct pressure   Wound exploration: wound explored through full range of motion and entire depth of wound probed and visualized   Treatment:    Area cleansed with:  Betadine and saline   Amount of cleaning:  Extensive   Irrigation solution:   Sterile saline   Irrigation volume:    Irrigation method:  Pressure wash   Visualized foreign bodies/material removed: no   Skin repair:    Repair method:  Sutures   Suture size:  4-0   Suture material:  Prolene   Number of sutures:  2 Approximation:    Approximation:  Close   (including critical care time)  Medications Ordered in ED Medications  bacitracin ointment (has no administration in time range)  Tdap (BOOSTRIX) injection 0.5 mL (0.5 mLs Intramuscular Given 12/19/17 1529)  lidocaine (XYLOCAINE) 2 % (with pres) injection 300 mg (300 mg Intradermal Given by Other 12/19/17 1530)  morphine 4 MG/ML injection 4 mg (4 mg Intravenous Given 12/19/17 1629)  ondansetron (ZOFRAN-ODT) disintegrating tablet 4 mg (4 mg Oral Given 12/19/17 1629)     Initial Impression / Assessment and Plan /  ED Course  I have reviewed the triage vital signs and the nursing notes.  Pertinent labs & imaging results that were available during my care of the patient were reviewed by me and considered in my medical decision making (see chart for details).  Final Clinical Impressions(s) / ED Diagnoses   Final diagnoses:  Laceration of left index finger without foreign body with damage to nail, initial encounter   Patient presenting with left index finger laceration that occurred prior to arrival with a hedge trimmer.  Imaging did not show any evidence of foreign body or bony involvement.  Pressure irrigation performed. Wound explored and base of wound visualized in a bloodless field without evidence of foreign body.  Laceration occurred < 8 hours prior to repair which was well tolerated.  Tdap updated.  Pt discharged with antibiotics.  Discussed suture home care with patient and answered questions. Pt to follow-up for wound check and suture removal in 7-10 days; they are to return to the ED sooner for signs of infection. Pt is hemodynamically stable with no complaints prior to dc.   ED Discharge Orders         Ordered    cephALEXin (KEFLEX) 500 MG capsule  4 times daily     12/19/17 1657       Keslyn Teater S, PA-C 12/19/17 1659    Tegeler, Canary Brim, MD 12/19/17 [phone]

## 2017-12-19 NOTE — Discharge Instructions (Signed)
You were given a prescription for antibiotics. Please take the antibiotic prescription fully.   I have prescribed a new medication for you today. It is important that when you pick the prescription up you discuss the potential interactions of this medication with other medications you are taking, including over the counter medications, with the pharmacists.   This new medication has potential side effects. Be sure to contact your primary care provider or return to the emergency department if you are experiencing new symptoms that you are unable to tolerate after starting the medication. You need to receive medical evaluation immediately if you start to experience blistering of the skin, rash, swelling, or difficulty breathing as these signs could indicate a more serious medication side effect.   please follow-up for suture removal in 7 to 10 days.  You may return to urgent care, the emergency department, or your primary doctor to have these removed. Please return to the emergency room immediately if you experience any new or worsening symptoms or any symptoms that indicate worsening infection such as fevers, increased redness/swelling/pain, warmth, or drainage from the affected area.

## 2017-12-27 DIAGNOSIS — S61219A Laceration without foreign body of unspecified finger without damage to nail, initial encounter: Secondary | ICD-10-CM | POA: Diagnosis not present

## 2017-12-27 DIAGNOSIS — Z4802 Encounter for removal of sutures: Secondary | ICD-10-CM | POA: Diagnosis not present

## 2018-01-06 DIAGNOSIS — G4733 Obstructive sleep apnea (adult) (pediatric): Secondary | ICD-10-CM | POA: Diagnosis not present

## 2018-02-05 DIAGNOSIS — G4733 Obstructive sleep apnea (adult) (pediatric): Secondary | ICD-10-CM | POA: Diagnosis not present

## 2018-02-12 DIAGNOSIS — Z1159 Encounter for screening for other viral diseases: Secondary | ICD-10-CM | POA: Diagnosis not present

## 2018-02-12 DIAGNOSIS — Z125 Encounter for screening for malignant neoplasm of prostate: Secondary | ICD-10-CM | POA: Diagnosis not present

## 2018-02-12 DIAGNOSIS — E7800 Pure hypercholesterolemia, unspecified: Secondary | ICD-10-CM | POA: Diagnosis not present

## 2018-02-12 DIAGNOSIS — I4891 Unspecified atrial fibrillation: Secondary | ICD-10-CM | POA: Diagnosis not present

## 2018-02-12 DIAGNOSIS — E669 Obesity, unspecified: Secondary | ICD-10-CM | POA: Diagnosis not present

## 2018-02-12 DIAGNOSIS — Z0000 Encounter for general adult medical examination without abnormal findings: Secondary | ICD-10-CM | POA: Diagnosis not present

## 2018-02-14 DIAGNOSIS — H25012 Cortical age-related cataract, left eye: Secondary | ICD-10-CM | POA: Diagnosis not present

## 2018-02-23 MED FILL — MOXIFLOXACIN HCL 0.5 % SOLN: 0.5 | 15 days supply | Qty: 3 | Fill #0

## 2018-02-23 MED FILL — PREDNISOLONE AC 1% EYE DROP: 1 | 25 days supply | Qty: 5 | Fill #0

## 2018-02-27 DIAGNOSIS — H25812 Combined forms of age-related cataract, left eye: Secondary | ICD-10-CM | POA: Diagnosis not present

## 2018-02-27 DIAGNOSIS — H2512 Age-related nuclear cataract, left eye: Secondary | ICD-10-CM | POA: Diagnosis not present

## 2018-02-27 DIAGNOSIS — H25012 Cortical age-related cataract, left eye: Secondary | ICD-10-CM | POA: Diagnosis not present

## 2018-03-24 ENCOUNTER — Other Ambulatory Visit: Payer: Self-pay

## 2018-03-24 ENCOUNTER — Encounter (HOSPITAL_COMMUNITY): Payer: Self-pay | Admitting: *Deleted

## 2018-03-24 ENCOUNTER — Emergency Department (HOSPITAL_COMMUNITY): Payer: PPO

## 2018-03-24 ENCOUNTER — Emergency Department (HOSPITAL_COMMUNITY)
Admission: EM | Admit: 2018-03-24 | Discharge: 2018-03-24 | Disposition: A | Discharge status: Home / Self Care | Payer: PPO | Attending: Emergency Medicine | Admitting: Emergency Medicine

## 2018-03-24 DIAGNOSIS — S99921A Unspecified injury of right foot, initial encounter: Secondary | ICD-10-CM

## 2018-03-24 DIAGNOSIS — M79671 Pain in right foot: Secondary | ICD-10-CM | POA: Insufficient documentation

## 2018-03-24 DIAGNOSIS — Z7982 Long term (current) use of aspirin: Secondary | ICD-10-CM | POA: Diagnosis not present

## 2018-03-24 DIAGNOSIS — Z79899 Other long term (current) drug therapy: Secondary | ICD-10-CM | POA: Insufficient documentation

## 2018-03-24 DIAGNOSIS — M7989 Other specified soft tissue disorders: Secondary | ICD-10-CM | POA: Diagnosis not present

## 2018-03-24 MED ORDER — KETOROLAC TROMETHAMINE 30 MG/ML IJ SOLN
30.0000 mg | Freq: Once | INTRAMUSCULAR | Status: AC
  Administered 2018-03-24: 30 mg via INTRAMUSCULAR
  Filled 2018-03-24: qty 1

## 2018-03-24 NOTE — ED Triage Notes (Signed)
The pt has rt foot pain a 120lb object fell onto his foot just pta  Painful and swollen

## 2018-03-24 NOTE — ED Notes (Signed)
Pt verbalizes understanding of d/c instructions. Pt taken to lobby in wheelchair at d/c with all belongings and with family.   

## 2018-03-24 NOTE — ED Notes (Signed)
See EDP assessment 

## 2018-03-24 NOTE — Discharge Instructions (Signed)
1. Medications: Alternate 600 mg of ibuprofen and [phone] mg of Tylenol every 3 hours as needed for pain. Do not exceed 4000 mg of Tylenol daily.  Take ibuprofen with food to avoid upset stomach issues.  2. Treatment: rest, ice, elevate and use brace, drink plenty of fluids, gentle stretching.  Use crutches when you are walking for comfort. 3. Follow Up: Please followup with orthopedics or podiatry as directed or your PCP in 1 week if no improvement for discussion of your diagnoses and further evaluation after today's visit; Please return to the ER for worsening symptoms or other concerns such as worsening swelling, redness of the skin, fevers, loss of pulses, or loss of feeling

## 2018-03-24 NOTE — ED Provider Notes (Signed)
[facility] EMERGENCY DEPARTMENT Provider Note   CSN: [id number] Arrival date & time: 03/24/18  1547     History   Chief Complaint Chief Complaint  Patient presents with  . Foot Injury    HPI Phillip Shannon is a 67 y.o. male with history of arthritis, nephrolithiasis, paroxysmal A. fib, and obesity presents for evaluation of acute onset, constant right foot pain beginning earlier today.  He states that he dropped a 120 pound metal object on the dorsum of his foot.  He notes swelling as well as constant pain which is at times throbbing and at times sharp, primarily overlying the right first MTP joint.  He notes noted history of arthritis to that joint.  Pain worsens with ambulation and movement.  Denies numbness or weakness, no fevers or chills.  No medications prior to arrival.  The history is provided by the patient.    Past Medical History:  Diagnosis Date  . Arthritis   . Kidney stone   . Paroxysmal atrial fibrillation (HCC)   . PONV (postoperative nausea and vomiting)     Patient Active Problem List   Diagnosis Date Noted  . Paroxysmal atrial fibrillation (HCC)   . Hx of cardiomegaly 07/17/2017  . Obesity (BMI 30.0-34.9) 07/17/2017  . Sleep apnea 07/17/2017  . Pain in right hip 07/28/2016  . Atrial fibrillation, persistent (HCC) 03/11/2015  . Renal colic on left side 01/27/2015  . Left ureteral stone 01/27/2015  . Ureteral stone 12/30/2013  . Renal calculus 04/23/2012    Past Surgical History:  Procedure Laterality Date  . BACK SURGERY    . CYSTOSCOPY WITH RETROGRADE PYELOGRAM, URETEROSCOPY AND STENT PLACEMENT Right 12/31/2013   Procedure: CYSTOSCOPY WITH RETROGRADE PYELOGRAM, URETEROSCOPY AND STONE REMOVAL;  Surgeon: Kathi Ludwig, MD;  Location: [facility];  Service: Urology;  Laterality: Right;  . CYSTOSCOPY WITH RETROGRADE PYELOGRAM, URETEROSCOPY AND STENT PLACEMENT Left 01/27/2015   Procedure: CYSTOSCOPY WITH LEFT RETROGRADE PYELOGRAM,  LEFT  SEMI-RIGID AND DIGITAL  URETEROSCOPY AND LEFT  STENT PLACEMENT STONE EXTRACTION;  Surgeon: Sebastian Ache, MD;  Location: [facility];  Service: Urology;  Laterality: Left;  . EYE SURGERY Right 07/2013  . HOLMIUM LASER APPLICATION Left 01/27/2015   Procedure: HOLMIUM LASER APPLICATION;  Surgeon: Sebastian Ache, MD;  Location: [facility];  Service: Urology;  Laterality: Left;  . LITHOTRIPSY          Home Medications    Prior to Admission medications   Medication Sig Start Date End Date Taking? Authorizing Provider  aspirin 325 MG tablet Take 325 mg by mouth daily.    [provider]  traMADol (ULTRAM) 50 MG tablet Take 1 tablet (50 mg total) by mouth every 12 (twelve) hours as needed. 12/19/17   Couture, Cortni S, PA-C    Family History Family History  Problem Relation Age of Onset  . Alcohol abuse Father   . Alcohol abuse Brother   . Heart disease Sister        aorta    Social History Social History   Tobacco Use  . Smoking status: Never Smoker  . Smokeless tobacco: Never Used  Substance Use Topics  . Alcohol use: No  . Drug use: No     Allergies   Hornet venom and Codeine   Review of Systems Review of Systems  Constitutional: Negative for chills and fever.  Musculoskeletal: Positive for arthralgias.  Neurological: Negative for weakness and numbness.     Physical Exam Updated Vital Signs BP  137/89 (BP Location: Right Arm)   Pulse 94   Temp 98.7 F (37.1 C) (Oral)   Resp 14   Ht 5\' 10"  (1.778 m)   Wt 115.7 kg   SpO2 97%   BMI 36.59 kg/m   Physical Exam  Constitutional: He appears well-developed and well-nourished. No distress.  HENT:  Head: Normocephalic and atraumatic.  Eyes: Conjunctivae are normal. Right eye exhibits no discharge. Left eye exhibits no discharge.  Neck: No JVD present. No tracheal deviation present.  Cardiovascular: Normal rate and intact distal pulses.  2+ DP/PT pulses bilaterally, no pitting edema of the lower extremity's.   Homans sign absent bilaterally  Pulmonary/Chest: Effort normal.  Abdominal: He exhibits no distension.  Musculoskeletal: He exhibits edema and tenderness.  Hypertrophic right first toenail, firmly adhered to nail bed.  Swelling to the dorsum of the right foot, worse overlying the right first MTP joint.  Tenderness to palpation overlying this area with no underlying crepitus or ecchymosis.  5/5 strength of BLE major muscle groups.  Neurological: He is alert.  Fluent speech, no facial droop, sensation intact to soft touch of bilateral lower extremities.  Ambulates with a mildly antalgic gait.  Exhibits good balance.  Skin: Skin is warm and dry. No erythema.  Psychiatric: He has a normal mood and affect. His behavior is normal.  Nursing note and vitals reviewed.    ED Treatments / Results  Labs (all labs ordered are listed, but only abnormal results are displayed) Labs Reviewed - No data to display  EKG None  Radiology Dg Foot Complete Right  Result Date: 03/24/2018 CLINICAL DATA:  Right foot pain, primarily at the great toe, after dropping 120 lb onto his foot earlier today. EXAM: RIGHT FOOT COMPLETE - 3+ VIEW COMPARISON:  None. FINDINGS: First MTP joint degenerative spur formation. No fracture or dislocation. Mild distal soft tissue swelling. IMPRESSION: 1. Mild distal soft tissue swelling without underlying fracture or dislocation. 2. First MTP joint degenerative changes. Electronically Signed   By: Beckie Salts M.D.   On: 03/24/2018 16:55    Procedures Procedures (including critical care time)  Medications Ordered in ED Medications  ketorolac (TORADOL) 30 MG/ML injection 30 mg (30 mg Intramuscular Given 03/24/18 1713)     Initial Impression / Assessment and Plan / ED Course  I have reviewed the triage vital signs and the nursing notes.  Pertinent labs & imaging results that were available during my care of the patient were reviewed by me and considered in my medical decision  making (see chart for details).     Patient with pain to the dorsum of the right foot secondary to injury earlier today.  He is afebrile, vital signs are stable.  He is nontoxic in appearance.  He is neurovascularly intact.  Components are soft.  Examination of the right ankle is entirely unremarkable, Achilles tendon appears intact.  Focally tender overlying the right first MTP joint.  Patient X-Ray negative for obvious fracture or dislocation but there is first MTP joint degenerative changes. Pain managed in ED. Pt advised to follow up with orthopedics or podiatry if symptoms persist for possibility of missed fracture diagnosis. Patient given brace while in ED, conservative therapy recommended and discussed.  RICE therapy indicated and discussed with patient.  Discussed strict ED return precautions.  Patient and family verbalized understanding of and agreement with plan and patient is stable for discharge home at this time.   Final Clinical Impressions(s) / ED Diagnoses   Final diagnoses:  Injury of right foot, initial encounter    ED Discharge Orders    None       Bennye Alm 03/24/18 1742    Margarita Grizzle, MD 03/26/18 2026

## 2018-04-23 DIAGNOSIS — H1031 Unspecified acute conjunctivitis, right eye: Secondary | ICD-10-CM | POA: Diagnosis not present

## 2018-04-24 DIAGNOSIS — J069 Acute upper respiratory infection, unspecified: Secondary | ICD-10-CM | POA: Diagnosis not present

## 2018-05-14 DIAGNOSIS — R0989 Other specified symptoms and signs involving the circulatory and respiratory systems: Secondary | ICD-10-CM | POA: Diagnosis not present

## 2018-05-14 DIAGNOSIS — Z23 Encounter for immunization: Secondary | ICD-10-CM | POA: Diagnosis not present

## 2018-05-14 MED FILL — SULFAMETHOXAZOLE-TMP DS TAB: 800-160 | 10 days supply | Qty: 20 | Fill #0

## 2018-05-23 ENCOUNTER — Emergency Department (HOSPITAL_COMMUNITY)
Admission: EM | Admit: 2018-05-23 | Discharge: 2018-05-23 | Disposition: A | Discharge status: Home / Self Care | Payer: No Typology Code available for payment source | Attending: Emergency Medicine | Admitting: Emergency Medicine

## 2018-05-23 ENCOUNTER — Encounter (HOSPITAL_COMMUNITY): Payer: Self-pay | Admitting: *Deleted

## 2018-05-23 ENCOUNTER — Emergency Department (HOSPITAL_COMMUNITY): Payer: No Typology Code available for payment source

## 2018-05-23 DIAGNOSIS — Y999 Unspecified external cause status: Secondary | ICD-10-CM | POA: Insufficient documentation

## 2018-05-23 DIAGNOSIS — I4891 Unspecified atrial fibrillation: Secondary | ICD-10-CM | POA: Diagnosis not present

## 2018-05-23 DIAGNOSIS — Z7982 Long term (current) use of aspirin: Secondary | ICD-10-CM | POA: Diagnosis not present

## 2018-05-23 DIAGNOSIS — S82202A Unspecified fracture of shaft of left tibia, initial encounter for closed fracture: Secondary | ICD-10-CM

## 2018-05-23 DIAGNOSIS — Y92410 Unspecified street and highway as the place of occurrence of the external cause: Secondary | ICD-10-CM | POA: Insufficient documentation

## 2018-05-23 DIAGNOSIS — S82402A Unspecified fracture of shaft of left fibula, initial encounter for closed fracture: Secondary | ICD-10-CM

## 2018-05-23 DIAGNOSIS — I480 Paroxysmal atrial fibrillation: Secondary | ICD-10-CM | POA: Insufficient documentation

## 2018-05-23 DIAGNOSIS — Y9389 Activity, other specified: Secondary | ICD-10-CM | POA: Diagnosis not present

## 2018-05-23 DIAGNOSIS — S82832A Other fracture of upper and lower end of left fibula, initial encounter for closed fracture: Secondary | ICD-10-CM | POA: Diagnosis not present

## 2018-05-23 DIAGNOSIS — W19XXXA Unspecified fall, initial encounter: Secondary | ICD-10-CM

## 2018-05-23 DIAGNOSIS — R52 Pain, unspecified: Secondary | ICD-10-CM | POA: Diagnosis not present

## 2018-05-23 DIAGNOSIS — S8992XA Unspecified injury of left lower leg, initial encounter: Secondary | ICD-10-CM | POA: Diagnosis present

## 2018-05-23 DIAGNOSIS — S82445A Nondisplaced spiral fracture of shaft of left fibula, initial encounter for closed fracture: Secondary | ICD-10-CM | POA: Diagnosis not present

## 2018-05-23 MED ORDER — OXYCODONE-ACETAMINOPHEN 5-325 MG PO TABS
1.0000 | ORAL_TABLET | Freq: Once | ORAL | Status: AC
Start: 2018-05-23 — End: 2018-05-23
  Administered 2018-05-23: 1 via ORAL
  Filled 2018-05-23: qty 1

## 2018-05-23 MED ORDER — OXYCODONE-ACETAMINOPHEN 5-325 MG PO TABS: 1.0000 | ORAL_TABLET | ORAL | 0 refills | Status: DC | PRN

## 2018-05-23 MED FILL — OXYCODONE-ACETAMINOPHEN 5-3: 5-325 | 3 days supply | Qty: 20 | Fill #0

## 2018-05-23 NOTE — ED Triage Notes (Signed)
Per EMS, pt slipped down 3 steps, left leg went backwards. Pt complains of left ankle, knee and hip pain. PMS intact, no neck or back pain, no loss of consciousness, no head injury.

## 2018-05-23 NOTE — ED Notes (Signed)
Bed: OZ36 Expected date:  Expected time:  Means of arrival:  Comments: EMS?FALL knee pain

## 2018-05-23 NOTE — Discharge Instructions (Addendum)
Elevate your left foot above your heart is much as possible for several days.  Use ice on the sore area of your left ankle 3 or 4 times a day for 30 minutes.  When you are up, use the walking boot.

## 2018-05-23 NOTE — ED Provider Notes (Signed)
[facility]-EMERGENCY DEPT Provider Note   CSN: [id number] Arrival date & time: 05/23/18  0800     History   Chief Complaint Chief Complaint  Patient presents with  . Fall  . Leg Pain    HPI Phillip Shannon is a 67 y.o. male.  HPI   He injured his left leg, when he was exiting his bus, and tripped on something on the floor.  He went down 2 or 3 steps.  He was unable to stand on his own afterwards because of pain in the left ankle, left knee and left hip.  He presents by EMS without immobilization or any specific treatment.  He denies injury to head, neck, back, chest, or abdomen.  He has been well recently other than currently taking an antibiotic for a cold.  There are no other known modifying factors.  Past Medical History:  Diagnosis Date  . Arthritis   . Kidney stone   . Paroxysmal atrial fibrillation (HCC)   . PONV (postoperative nausea and vomiting)     Patient Active Problem List   Diagnosis Date Noted  . Paroxysmal atrial fibrillation (HCC)   . Hx of cardiomegaly 07/17/2017  . Obesity (BMI 30.0-34.9) 07/17/2017  . Sleep apnea 07/17/2017  . Pain in right hip 07/28/2016  . Atrial fibrillation, persistent 03/11/2015  . Renal colic on left side 01/27/2015  . Left ureteral stone 01/27/2015  . Ureteral stone 12/30/2013  . Renal calculus 04/23/2012    Past Surgical History:  Procedure Laterality Date  . BACK SURGERY    . CYSTOSCOPY WITH RETROGRADE PYELOGRAM, URETEROSCOPY AND STENT PLACEMENT Right 12/31/2013   Procedure: CYSTOSCOPY WITH RETROGRADE PYELOGRAM, URETEROSCOPY AND STONE REMOVAL;  Surgeon: Kathi Ludwig, MD;  Location: [facility];  Service: Urology;  Laterality: Right;  . CYSTOSCOPY WITH RETROGRADE PYELOGRAM, URETEROSCOPY AND STENT PLACEMENT Left 01/27/2015   Procedure: CYSTOSCOPY WITH LEFT RETROGRADE PYELOGRAM, LEFT  SEMI-RIGID AND DIGITAL  URETEROSCOPY AND LEFT  STENT PLACEMENT STONE EXTRACTION;  Surgeon: Sebastian Ache, MD;   Location: [facility];  Service: Urology;  Laterality: Left;  . EYE SURGERY Right 07/2013  . HOLMIUM LASER APPLICATION Left 01/27/2015   Procedure: HOLMIUM LASER APPLICATION;  Surgeon: Sebastian Ache, MD;  Location: [facility];  Service: Urology;  Laterality: Left;  . LITHOTRIPSY          Home Medications    Prior to Admission medications   Medication Sig Start Date End Date Taking? Authorizing Provider  aspirin 325 MG tablet Take 325 mg by mouth daily.    [provider]  oxyCODONE-acetaminophen (PERCOCET) 5-325 MG tablet Take 1 tablet by mouth every 4 (four) hours as needed for moderate pain or severe pain. 05/23/18   Mancel Bale, MD  traMADol (ULTRAM) 50 MG tablet Take 1 tablet (50 mg total) by mouth every 12 (twelve) hours as needed. 12/19/17   Couture, Cortni S, PA-C    Family History Family History  Problem Relation Age of Onset  . Alcohol abuse Father   . Alcohol abuse Brother   . Heart disease Sister        aorta    Social History Social History   Tobacco Use  . Smoking status: Never Smoker  . Smokeless tobacco: Never Used  Substance Use Topics  . Alcohol use: No  . Drug use: No     Allergies   Hornet venom and Codeine   Review of Systems Review of Systems  All other systems reviewed and are negative.  Physical Exam Updated Vital Signs BP (!) 125/94 (BP Location: Left Arm)   Pulse 79   Temp 98.6 F (37 C) (Oral)   Resp 18   SpO2 98%   Physical Exam  Constitutional: He is oriented to person, place, and time. He appears well-developed and well-nourished.  HENT:  Head: Normocephalic and atraumatic.  Right Ear: External ear normal.  Left Ear: External ear normal.  Eyes: Pupils are equal, round, and reactive to light. Conjunctivae and EOM are normal.  Neck: Normal range of motion and phonation normal. Neck supple.  Cardiovascular: Normal rate, regular rhythm and normal heart sounds.  Pulmonary/Chest: Effort normal and breath sounds normal. He  exhibits no bony tenderness.  Abdominal: Soft. There is no tenderness.  Musculoskeletal: Normal range of motion.  Tender left ankle without deformity, guards against movement at this site.  Minimally tender left knee.  Nontender left hip with passive range of motion.  No deformity or tenderness of the arms, or right leg.  Neurological: He is alert and oriented to person, place, and time. No cranial nerve deficit or sensory deficit. He exhibits normal muscle tone. Coordination normal.  Skin: Skin is warm, dry and intact.  Psychiatric: He has a normal mood and affect. His behavior is normal. Judgment and thought content normal.  Nursing note and vitals reviewed.    ED Treatments / Results  Labs (all labs ordered are listed, but only abnormal results are displayed) Labs Reviewed - No data to display  EKG EKG Interpretation  Date/Time:  Wednesday May 23 2018 08:19:46 EST Ventricular Rate:  85 PR Interval:    QRS Duration: 93 QT Interval:  392 QTC Calculation: 467 R Axis:   25 Text Interpretation:  Atrial fibrillation Since last tracing Atrial fibrillation is new Confirmed by Mancel Bale [id number]) on 05/23/2018 8:51:52 AM  CHA2DS2/VAS Stroke Risk Points  Current as of 16 minutes ago     1 >= 2 Points: High Risk  1 - 1.99 Points: Medium Risk  0 Points: Low Risk    This is the only CHA2DS2/VAS Stroke Risk Points available for the past  year.:  Last Change: N/A     Details    This score determines the patient's risk of having a stroke if the  patient has atrial fibrillation.       Points Metrics  0 Has Congestive Heart Failure:  No    Current as of 16 minutes ago  0 Has Vascular Disease:  No    Current as of 16 minutes ago  0 Has Hypertension:  No    Current as of 16 minutes ago  1 Age:  31    Current as of 16 minutes ago  0 Has Diabetes:  No    Current as of 16 minutes ago  0 Had Stroke:  No  Had TIA:  No  Had thromboembolism:  No    Current as of 16 minutes ago  0  Male:  No    Current as of 16 minutes ago           Radiology Dg Ankle Complete Left  Result Date: 05/23/2018 CLINICAL DATA:  Left ankle pain after fall. EXAM: LEFT ANKLE COMPLETE - 3+ VIEW COMPARISON:  None. FINDINGS: Nondisplaced oblique fracture is seen involving the distal left fibula. Joint spaces are intact. No soft tissue abnormality is noted. IMPRESSION: Nondisplaced distal left fibular fracture. Electronically Signed   By: Lupita Raider, M.D.   On: 05/23/2018 09:13   Dg  Knee Complete 4 Views Left  Result Date: 05/23/2018 CLINICAL DATA:  Left knee pain after fall. EXAM: LEFT KNEE - COMPLETE 4+ VIEW COMPARISON:  None. FINDINGS: No evidence of fracture, dislocation, or joint effusion. No evidence of arthropathy or other focal bone abnormality. Soft tissues are unremarkable. IMPRESSION: Negative. Electronically Signed   By: Lupita Raider, M.D.   On: 05/23/2018 09:12    Procedures Procedures (including critical care time)  Medications Ordered in ED Medications  oxyCODONE-acetaminophen (PERCOCET/ROXICET) 5-325 MG per tablet 1 tablet (1 tablet Oral Given 05/23/18 0827)     Initial Impression / Assessment and Plan / ED Course  I have reviewed the triage vital signs and the nursing notes.  Pertinent labs & imaging results that were available during my care of the patient were reviewed by me and considered in my medical decision making (see chart for details).  Clinical Course as of May 24 1003  Wed May 23, 2018  0900 Spiral distal fibula fracture, images reviewed by me  DG Ankle Complete Left [EW]  0903 No fracture or significant knee effusion.  Images reviewed by me.  DG Knee Complete 4 Views Left [EW]    Clinical Course User Index [EW] Mancel Bale, MD     Patient Vitals for the past 24 hrs:  BP Temp Temp src Pulse Resp SpO2  05/23/18 0813 (!) 125/94 98.6 F (37 C) Oral 79 18 98 %    10:04 AM Reevaluation with update and discussion. After initial assessment  and treatment, an updated evaluation reveals no change in clinical status.  He remains comfortable.  Findings discussed with patient, and wife, all questions answered. Mancel Bale   Medical Decision Making: Fall, mechanical, with isolated injury to left distal fibula.  Incidental recurrence of atrial fibrillation, not associated with fall today.  Patient is low risk for embolism.  He is stable for discharge with outpatient management by PCP.  CRITICAL CARE-no Performed by: Mancel Bale  Nursing Notes Reviewed/ Care Coordinated Applicable Imaging Reviewed Interpretation of Laboratory Data incorporated into ED treatment  The patient appears reasonably screened and/or stabilized for discharge and I doubt any other medical condition or other [facility] requiring further screening, evaluation, or treatment in the ED at this time prior to discharge.  Plan: Home Medications-continue usual medications; Home Treatments-rest, fluids; return here if the recommended treatment, does not improve the symptoms; Recommended follow up-PCP follow-up 1 week for checkup on fracture, and consideration for further evaluation of atrial fibrillation.  Out of work until sees PCP next week.    Final Clinical Impressions(s) / ED Diagnoses   Final diagnoses:  Fracture, fibula, with tibia, left, closed, initial encounter  Fall, initial encounter    ED Discharge Orders         Ordered    oxyCODONE-acetaminophen (PERCOCET) 5-325 MG tablet  Every 4 hours PRN     05/23/18 1002           Mancel Bale, MD 05/23/18 1007

## 2018-05-25 MED FILL — traMADol HCL 50 MG TABS: 50 | 10 days supply | Qty: 30 | Fill #0

## 2018-06-01 DIAGNOSIS — R05 Cough: Secondary | ICD-10-CM | POA: Diagnosis not present

## 2018-06-01 DIAGNOSIS — M25562 Pain in left knee: Secondary | ICD-10-CM | POA: Diagnosis not present

## 2018-06-01 DIAGNOSIS — S82409D Unspecified fracture of shaft of unspecified fibula, subsequent encounter for closed fracture with routine healing: Secondary | ICD-10-CM | POA: Diagnosis not present

## 2018-06-01 DIAGNOSIS — W19XXXD Unspecified fall, subsequent encounter: Secondary | ICD-10-CM | POA: Diagnosis not present

## 2018-06-01 DIAGNOSIS — I4891 Unspecified atrial fibrillation: Secondary | ICD-10-CM | POA: Diagnosis not present

## 2018-06-01 DIAGNOSIS — S82409A Unspecified fracture of shaft of unspecified fibula, initial encounter for closed fracture: Secondary | ICD-10-CM | POA: Diagnosis not present

## 2018-06-05 ENCOUNTER — Ambulatory Visit
Admission: RE | Admit: 2018-06-05 | Discharge: 2018-06-05 | Disposition: A | Discharge status: Home / Self Care | Payer: PPO | Source: Ambulatory Visit | Attending: Physician Assistant | Admitting: Physician Assistant

## 2018-06-05 ENCOUNTER — Other Ambulatory Visit: Payer: Self-pay | Admitting: Physician Assistant

## 2018-06-05 DIAGNOSIS — R062 Wheezing: Secondary | ICD-10-CM | POA: Diagnosis not present

## 2018-06-05 DIAGNOSIS — R05 Cough: Secondary | ICD-10-CM

## 2018-06-05 DIAGNOSIS — R059 Cough, unspecified: Secondary | ICD-10-CM

## 2018-06-06 MED FILL — predniSONE 20 MG TABS: 20 | 6 days supply | Qty: 12 | Fill #0

## 2018-06-11 NOTE — Progress Notes (Signed)
Cardiology Office Note   Date:  06/13/2018   ID:  SHIMON TROWBRIDGE, DOB 06/25/1951, MRN [id number]  PCP:  Asencion Gowda.August Saucer, MD  Cardiologist:  [facility]  Chief Complaint  Patient presents with  . Atrial Fibrillation     History of Present Illness: Phillip Shannon is a 67 y.o. male who presents for ongoing assessment and management of paroxysmal atrial fibrillation. He has a CHADS VASC Score of 1. He was last seen by Dr. Allyson Sabal on 08/16/2017 and was to have home sleep study due to obesity and PAF.   Sleep study was completed and read by Dr, Tresa Endo on 09/11/2017 with findings of severe OSA, with severe O2 desaturations to a nadir of 75%. He was placed on CPAP with follow up with Dr. Tresa Endo on 11/07/2017 after CPAP titration study.   He tripped while exiting his bus, fell down steps on 05/23/2018. He sustained a closed fracture of his fibula, with tibia on the left. He was noted to be in atrial fib on ER evaluation.   Past Medical History:  Diagnosis Date  . Arthritis   . Kidney stone   . Paroxysmal atrial fibrillation (HCC)   . PONV (postoperative nausea and vomiting)     Past Surgical History:  Procedure Laterality Date  . BACK SURGERY    . CYSTOSCOPY WITH RETROGRADE PYELOGRAM, URETEROSCOPY AND STENT PLACEMENT Right 12/31/2013   Procedure: CYSTOSCOPY WITH RETROGRADE PYELOGRAM, URETEROSCOPY AND STONE REMOVAL;  Surgeon: Kathi Ludwig, MD;  Location: [facility];  Service: Urology;  Laterality: Right;  . CYSTOSCOPY WITH RETROGRADE PYELOGRAM, URETEROSCOPY AND STENT PLACEMENT Left 01/27/2015   Procedure: CYSTOSCOPY WITH LEFT RETROGRADE PYELOGRAM, LEFT  SEMI-RIGID AND DIGITAL  URETEROSCOPY AND LEFT  STENT PLACEMENT STONE EXTRACTION;  Surgeon: Sebastian Ache, MD;  Location: [facility];  Service: Urology;  Laterality: Left;  . EYE SURGERY Right 07/2013  . HOLMIUM LASER APPLICATION Left 01/27/2015   Procedure: HOLMIUM LASER APPLICATION;  Surgeon: Sebastian Ache, MD;  Location: [facility];  Service:  Urology;  Laterality: Left;  . LITHOTRIPSY       Current Outpatient Medications  Medication Sig Dispense Refill  . aspirin 325 MG tablet Take 325 mg by mouth daily.    Marland Kitchen oxyCODONE-acetaminophen (PERCOCET) 5-325 MG tablet Take 1 tablet by mouth every 4 (four) hours as needed for moderate pain or severe pain. 20 tablet 0  . traMADol (ULTRAM) 50 MG tablet Take 1 tablet (50 mg total) by mouth every 12 (twelve) hours as needed. 6 tablet 0   No current facility-administered medications for this visit.     Allergies:   Hornet venom and Codeine    Social History:  The patient  reports that he has never smoked. He has never used smokeless tobacco. He reports that he does not drink alcohol or use drugs.   Family History:  The patient's family history includes Alcohol abuse in his brother and father; Heart disease in his sister.    ROS: All other systems are reviewed and negative. Unless otherwise mentioned in H&P    PHYSICAL EXAM: VS:  BP 132/78   Pulse (!) 50   Ht 5\' 10"  (1.778 m)   Wt 267 lb 9.6 oz (121.4 kg)   SpO2 97%   BMI 38.40 kg/m  , BMI Body mass index is 38.4 kg/m. GEN: Well nourished, well developed, in no acute distress HEENT: normal Neck: no JVD, carotid bruits, or masses Cardiac: IRRR; no murmurs, rubs, or gallops,no edema  Respiratory:  Clear to auscultation  bilaterally, normal work of breathing GI: soft, nontender, nondistended, + BS MS: no deformity or atrophy, wearing walking cast on left lower leg.  Skin: warm and dry, no rash Neuro:  Strength and sensation are intact Psych: euthymic mood, full affect   EKG:  Atrial fibrillation rate of 109 bpm  Recent Labs: 07/17/2017: TSH 1.480    Lipid Panel No results found for: CHOL, TRIG, HDL, CHOLHDL, VLDL, LDLCALC, LDLDIRECT    Wt Readings from Last 3 Encounters:  06/13/18 267 lb 9.6 oz (121.4 kg)  03/24/18 255 lb (115.7 kg)  11/07/17 248 lb (112.5 kg)      Other studies Reviewed: Echo 07/17/2017 Left  ventricle: The cavity size was normal. There was severe   focal basal hypertrophy of the septum with otherwise mild   concentric hypertrophy. Systolic function was normal. The   estimated ejection fraction was in the range of 50% to 55%. Wall   motion was normal; there were no regional wall motion   abnormalities. - Aortic valve: Transvalvular velocity was within the normal range.   There was no stenosis. There was mild regurgitation. - Aorta: Ascending aortic diameter: 42 mm (S). - Ascending aorta: The ascending aorta was mildly dilated. - Mitral valve: Transvalvular velocity was within the normal range.   There was no evidence for stenosis. There was no regurgitation. - Left atrium: The atrium was severely dilated. - Right ventricle: The cavity size was normal. Wall thickness was   normal. Systolic function was normal. - Tricuspid valve: There was trivial regurgitation. - Pulmonary arteries: Systolic pressure was within the normal   range. PA peak pressure: 22 mm Hg (S).   ASSESSMENT AND PLAN:  1.PAF: Currently in atrial fib. CHADS Score has been rechecked. He remains at score of 1. Heart rate is not well controlled at this time both on EKG and auscultation. I will start low dose metoprolol XL 25 mg for better control. I will place a 48 hour Holter Monitor for evaluation of HR control outside of clinic.   2. OSA:  He is not compliant with CPAP which may be contributing to PAF and current persistent atrial fib. He is advised to begin using CPAP consistently.     Current medicines are reviewed at length with the patient today.    Labs/ tests ordered today include: Holter.   Bettey MareKathryn M. Liborio NixonLawrence DNP, ANP, AACC   06/13/2018 11:11 AM    [facility] [address] Office [phone] Fax [phone]) [phone]

## 2018-06-13 ENCOUNTER — Ambulatory Visit (INDEPENDENT_AMBULATORY_CARE_PROVIDER_SITE_OTHER): Payer: PPO | Admitting: Adult Health

## 2018-06-13 ENCOUNTER — Encounter: Payer: Self-pay | Admitting: Adult Health

## 2018-06-13 VITALS — BP 132/78 | HR 50 | Ht 70.0 in | Wt 267.6 lb

## 2018-06-13 DIAGNOSIS — I4819 Other persistent atrial fibrillation: Secondary | ICD-10-CM

## 2018-06-13 MED ORDER — ASPIRIN EC 81 MG PO TBEC: 81.0000 mg | DELAYED_RELEASE_TABLET | Freq: Every day | ORAL | 3 refills | Status: DC

## 2018-06-13 MED ORDER — METOPROLOL SUCCINATE ER 25 MG PO TB24: 25.0000 mg | ORAL_TABLET | Freq: Every day | ORAL | 6 refills | Status: DC

## 2018-06-13 MED FILL — METOPROLOL SUCCINATE ER 25: 25 | 30 days supply | Qty: 30 | Fill #0

## 2018-06-13 NOTE — Patient Instructions (Signed)
Medication Instructions:  START METOPROLOL XL 25MG  AT BEDTIME  If you need a refill on your cardiac medications before your next appointment, please call your pharmacy.  Labwork: If you have labs (blood work) drawn today and your tests are completely normal, you will receive your results only by: Marland Kitchen. MyChart Message (if you have MyChart) OR . A paper copy in the mail If you have any lab test that is abnormal or we need to change your treatment, we will call you to review the results.  Testing/Procedures: Your physician has recommended that you wear a 24 hour holter monitor. Holter monitors are medical devices that record the heart's electrical activity. Doctors most often use these monitors to diagnose arrhythmias. Arrhythmias are problems with the speed or rhythm of the heartbeat. The monitor is a small, portable device. You can wear one while you do your normal daily activities. This is usually used to diagnose what is causing palpitations/syncope (passing out).This [facility] - [address], [address].  Follow-Up: You will need a follow up appointment in 1 MONTH.    You may see  DR Coral SpikesBERRY,  Kathryn Lawrence, DNP, AACC or one of the following Advanced Practice Providers on your designated Care Team:  Corine ShelterLuke Kilroy, [place] JerseyPA-C  At [facility], you and your health needs are our priority.  As part of our continuing mission to provide you with exceptional heart care, we have created designated Provider Care Teams.  These Care Teams include your primary Cardiologist (physician) and Advanced Practice Providers (APPs -  Physician Assistants and Nurse Practitioners) who all work together to provide you with the care you need, when you need it.

## 2018-06-19 DIAGNOSIS — M8589 Other specified disorders of bone density and structure, multiple sites: Secondary | ICD-10-CM | POA: Diagnosis not present

## 2018-06-19 DIAGNOSIS — M810 Age-related osteoporosis without current pathological fracture: Secondary | ICD-10-CM | POA: Diagnosis not present

## 2018-06-28 ENCOUNTER — Ambulatory Visit (INDEPENDENT_AMBULATORY_CARE_PROVIDER_SITE_OTHER): Payer: PPO

## 2018-06-28 DIAGNOSIS — I4819 Other persistent atrial fibrillation: Secondary | ICD-10-CM

## 2018-07-06 ENCOUNTER — Encounter

## 2018-07-06 MED FILL — traMADol HCL 50 MG TABS: 50 | 10 days supply | Qty: 30 | Fill #0

## 2018-07-16 NOTE — Progress Notes (Signed)
SCardiology Office Note   Date:  07/17/2018   ID:  Phillip Shannon, DOB [birth year], MRN [id number]  PCP:  Asencion GowdaMitchell, L.August Saucerean, MD  Cardiologist:  Margo Ayer.Berry  No chief complaint on file.    History of Present Illness: Phillip Shannon is a 67 y.o. male who presents for ongoing assessment and management of paroxysmal atrial fibrillation. He has a CHADS VASC Score of 1. He was last seen by Dr. Allyson SabalBerry on 08/16/2017.   Sleep study completed and read by Dr, Tresa EndoKelly on 09/11/2017 with findings of severe OSA, with severe O2 desaturations to a nadir of 75%. He was placed on CPAP with follow up with Dr. Tresa EndoKelly on 11/07/2017 after CPAP titration study.   He was started on low dose Metoprolol XL 25 mg for better HR control on last office visit. 48 hour Holter monitor was placed. Report completed on 06/28/2018 reported  SR/ST with PVCs Atrial fibrillation with slow ventricular response, several long pauses (none greater than 2.5 seconds) and with rapid ventricular response up to 180 bpm.  He states that he has stopped wearing CPAP after he moved to a different address and was not wearing CPAP when he wore the monitor. He denies symptoms of dizziness, near syncope or chest pain.  He does have daytime somnolence, DOE, and fatigue.    Past Medical History:  Diagnosis Date  . Arthritis   . Kidney stone   . Paroxysmal atrial fibrillation (HCC)   . PONV (postoperative nausea and vomiting)     Past Surgical History:  Procedure Laterality Date  . BACK SURGERY    . CYSTOSCOPY WITH RETROGRADE PYELOGRAM, URETEROSCOPY AND STENT PLACEMENT Right 12/31/2013   Procedure: CYSTOSCOPY WITH RETROGRADE PYELOGRAM, URETEROSCOPY AND STONE REMOVAL;  Surgeon: Kathi LudwigSigmund I Tannenbaum, MD;  Location: [facility];  Service: Urology;  Laterality: Right;  . CYSTOSCOPY WITH RETROGRADE PYELOGRAM, URETEROSCOPY AND STENT PLACEMENT Left 01/27/2015   Procedure: CYSTOSCOPY WITH LEFT RETROGRADE PYELOGRAM, LEFT  SEMI-RIGID AND DIGITAL  URETEROSCOPY AND  LEFT  STENT PLACEMENT STONE EXTRACTION;  Surgeon: Sebastian Acheheodore Manny, MD;  Location: [facility];  Service: Urology;  Laterality: Left;  . EYE SURGERY Right 07/2013  . HOLMIUM LASER APPLICATION Left 01/27/2015   Procedure: HOLMIUM LASER APPLICATION;  Surgeon: Sebastian Acheheodore Manny, MD;  Location: [facility];  Service: Urology;  Laterality: Left;  . LITHOTRIPSY       Current Outpatient Medications  Medication Sig Dispense Refill  . aspirin EC 81 MG tablet Take 1 tablet (81 mg total) by mouth daily. 90 tablet 3  . metoprolol succinate (TOPROL XL) 25 MG 24 hr tablet Take 1 tablet (25 mg total) by mouth at bedtime. 30 tablet 6  . oxyCODONE-acetaminophen (PERCOCET) 5-325 MG tablet Take 1 tablet by mouth every 4 (four) hours as needed for moderate pain or severe pain. 20 tablet 0  . traMADol (ULTRAM) 50 MG tablet Take 1 tablet (50 mg total) by mouth every 12 (twelve) hours as needed. 6 tablet 0   No current facility-administered medications for this visit.     Allergies:   Hornet venom and Codeine    Social History:  The patient  reports that he has never smoked. He has never used smokeless tobacco. He reports that he does not drink alcohol or use drugs.   Family History:  The patient's family history includes Alcohol abuse in his brother and father; Heart disease in his sister.    ROS: All other systems are reviewed and negative. Unless otherwise mentioned in H&P  PHYSICAL EXAM: VS:  BP 138/90   Pulse (!) 58   Ht 5\' 10"  (1.778 m)   Wt 261 lb (118.4 kg)   BMI 37.45 kg/m  , BMI Body mass index is 37.45 kg/m. GEN: Well nourished, well developed, in no acute distress. Obese HEENT: normal Neck: no JVD, carotid bruits, or masses Cardiac: IRRR; no murmurs, rubs, or gallops,no edema  Respiratory:  Clear to auscultation bilaterally, normal work of breathing GI: soft, nontender, nondistended, + BS MS: no deformity or atrophy Skin: warm and dry, no rash Neuro:  Strength and sensation are intact Psych:  euthymic mood, full affect   EKG:  Not completed this office visit.   Recent Labs: No results found for requested labs within last 8760 hours.    Lipid Panel No results found for: CHOL, TRIG, HDL, CHOLHDL, VLDL, LDLCALC, LDLDIRECT    Wt Readings from Last 3 Encounters:  07/17/18 261 lb (118.4 kg)  06/13/18 267 lb 9.6 oz (121.4 kg)  03/24/18 255 lb (115.7 kg)      Other studies Reviewed: Echocardiogram 07/17/2017  Left ventricle: The cavity size was normal. There was severe   focal basal hypertrophy of the septum with otherwise mild   concentric hypertrophy. Systolic function was normal. The   estimated ejection fraction was in the range of 50% to 55%. Wall   motion was normal; there were no regional wall motion   abnormalities. - Aortic valve: Transvalvular velocity was within the normal range.   There was no stenosis. There was mild regurgitation. - Aorta: Ascending aortic diameter: 42 mm (S). - Ascending aorta: The ascending aorta was mildly dilated. - Mitral valve: Transvalvular velocity was within the normal range.   There was no evidence for stenosis. There was no regurgitation. - Left atrium: The atrium was severely dilated. - Right ventricle: The cavity size was normal. Wall thickness was   normal. Systolic function was normal. - Tricuspid valve: There was trivial regurgitation. - Pulmonary arteries: Systolic pressure was within the normal   range. PA peak pressure: 22 mm Hg (S).  ASSESSMENT AND PLAN:  1. Atrial fib with RVR: He will remain on metoprolol 25 mg daily and ASA. I was concerned about the pauses during Holter monitor evaluation. I discussed this with Dr. Jens Somrenshaw who is DOD today. He is not concerned about the pauses as they are not greater than 2.5 seconds. He recommends he continue metoprolol dose.   2. OSA:  He has not been wearing CPAP and may have been having pauses in the early morning hours while asleep. Thought to be related to non-compliance  with CPAP. I will schedule him to have a repeat sleep study in the setting of hx of OSA, with daytime somnolence, DOE.   Current medicines are reviewed at length with the patient today.    Labs/ tests ordered today include: Sleep study  Bettey MareKathryn M. Liborio NixonLawrence DNP, ANP, AACC   07/17/2018 12:01 PM    [facility] [address] Office [phone] Fax [phone]

## 2018-07-17 ENCOUNTER — Ambulatory Visit (INDEPENDENT_AMBULATORY_CARE_PROVIDER_SITE_OTHER): Payer: PPO | Admitting: Adult Health

## 2018-07-17 ENCOUNTER — Encounter: Payer: Self-pay | Admitting: Adult Health

## 2018-07-17 VITALS — BP 138/90 | HR 58 | Ht 70.0 in | Wt 261.0 lb

## 2018-07-17 DIAGNOSIS — G4733 Obstructive sleep apnea (adult) (pediatric): Secondary | ICD-10-CM

## 2018-07-17 DIAGNOSIS — E669 Obesity, unspecified: Secondary | ICD-10-CM | POA: Diagnosis not present

## 2018-07-17 DIAGNOSIS — R400 Somnolence: Secondary | ICD-10-CM

## 2018-07-17 DIAGNOSIS — R0600 Dyspnea, unspecified: Secondary | ICD-10-CM

## 2018-07-17 DIAGNOSIS — I480 Paroxysmal atrial fibrillation: Secondary | ICD-10-CM

## 2018-07-17 NOTE — Patient Instructions (Signed)
Medication Instructions:  NO CHANGES- Your physician recommends that you continue on your current medications as directed. Please refer to the Current Medication list given to you today.  If you need a refill on your cardiac medications before your next appointment, please call your pharmacy.  Labwork: NONE ORDERED HERE IN OUR OFFICE AT [facility] Take the provided lab slips with you to the lab for your blood draw.  If you have labs (blood work) drawn today and your tests are completely normal, you will receive your results only by MyChart Message (if you have MyChart) -OR- A paper copy in the mail.  If you have any lab test that is abnormal or we need to change your treatment, we will call you to review these results.  Special Instructions: Your physician has recommended that you have a sleep study. This test records several body functions during sleep, including: brain activity, eye movement, oxygen and carbon dioxide blood levels, heart rate and rhythm, breathing rate and rhythm, the flow of air through your mouth and nose, snoring, body muscle movements, and chest and belly movement. We will call you to schedule this appointment.  Follow-Up: You will need a follow up appointment in 6 months.  Please call our office 2 months in advance  (april)to schedule this appointment [facility](JUNE).  You may see Nanetta BattyJonathan Berry, MD Joni ReiningKathryn Lawrence, DNP, AACC or one of the following Advanced Practice Providers on your designated Care Team:  Corine ShelterLuke Kilroy, PA-C   Judy PimpleKrista Kroeger, PA-C   Marjie Skiffallie Goodrich, [place]-C      At [facility], you and your health needs are our priority.  As part of our continuing mission to provide you with exceptional heart care, we have created designated Provider Care Teams.  These Care Teams include your primary Cardiologist (physician) and Advanced Practice Providers (APPs -  Physician Assistants and Nurse Practitioners) who all work together to provide you with the care you need, when you need  it.

## 2018-07-23 ENCOUNTER — Telehealth: Payer: Self-pay | Admitting: *Deleted

## 2018-07-23 NOTE — Telephone Encounter (Signed)
-----   Message from Alyson Ingles, LPN sent at 48/18/5631  1:30 PM EST ----- Regarding: SLEEP TEST ORDERED SPLIT NIGHT ORDERED

## 2018-07-23 NOTE — Telephone Encounter (Signed)
PA submitted to HTA  For split night sleep study.

## 2018-07-25 ENCOUNTER — Telehealth: Payer: Self-pay | Admitting: *Deleted

## 2018-07-25 NOTE — Telephone Encounter (Signed)
Patient notified of sleep study appointment scheduled for 08/23/18. HTA auth# [id number].  Valid dates 07/23/18 to 10/21/18.

## 2018-07-25 NOTE — Telephone Encounter (Signed)
-----   Message from Michelle L Broome, LPN sent at 07/17/2018  1:30 PM EST ----- Regarding: SLEEP TEST ORDERED SPLIT NIGHT ORDERED   

## 2018-08-23 ENCOUNTER — Ambulatory Visit (HOSPITAL_BASED_OUTPATIENT_CLINIC_OR_DEPARTMENT_OTHER): Payer: PPO | Attending: Adult Health | Admitting: Cardiovascular Disease

## 2018-08-23 VITALS — Ht 70.0 in | Wt 261.0 lb

## 2018-08-23 DIAGNOSIS — G4733 Obstructive sleep apnea (adult) (pediatric): Secondary | ICD-10-CM

## 2018-08-23 DIAGNOSIS — R0600 Dyspnea, unspecified: Secondary | ICD-10-CM | POA: Diagnosis not present

## 2018-08-23 DIAGNOSIS — I480 Paroxysmal atrial fibrillation: Secondary | ICD-10-CM | POA: Insufficient documentation

## 2018-08-23 DIAGNOSIS — R400 Somnolence: Secondary | ICD-10-CM | POA: Diagnosis not present

## 2018-09-08 ENCOUNTER — Encounter (HOSPITAL_BASED_OUTPATIENT_CLINIC_OR_DEPARTMENT_OTHER): Payer: Self-pay | Admitting: Cardiovascular Disease

## 2018-09-08 NOTE — Procedures (Signed)
Patient Name: Phillip Shannon, Phillip Shannon Date: 08/23/2018 Gender: Male D.O.B: [birth year] Age (years): 63 Referring Provider: Shelva Majestic MD, ABSM Height (inches): 70 Interpreting Physician: Shelva Majestic MD, ABSM Weight (lbs): 254 RPSGT: [place], [place] BMI: 36 MRN: [id number] Neck Size: 17.00  CLINICAL INFORMATION The patient is referred for a split night study with BPAP.  Most recent HST dated 09/11/2017 revealed an AHI of 56.0/h, RDI of 56.0/h, and O2 nadir 75%.  Most recent titration study dated 11/07/2017 was optimal at 13cm H2O with an AHI of 5.6/h.  Pt has stopped using CPAP.   MEDICATIONS     aspirin EC 81 MG tablet             metoprolol succinate (TOPROL XL) 25 MG 24 hr tablet         oxyCODONE-acetaminophen (PERCOCET) 5-325 MG tablet         traMADol (ULTRAM) 50 MG tablet      Medications self-administered by patient taken the night of the study : N/A  SLEEP STUDY TECHNIQUE As per the AASM Manual for the Scoring of Sleep and Associated Events v2.3 (April 2016) with a hypopnea requiring 4% desaturations.  The channels recorded and monitored were frontal, central and occipital EEG, electrooculogram (EOG), submentalis EMG (chin), nasal and oral airflow, thoracic and abdominal wall motion, anterior tibialis EMG, snore microphone, electrocardiogram, and pulse oximetry. Bi-level positive airway pressure (BiPAP) was initiated when the patient met split night criteria and was titrated according to treat sleep-disordered breathing.  RESPIRATORY PARAMETERS Diagnostic Total AHI (/hr): 69.9 RDI (/hr): 69.9 OA Index (/hr): 58.3 CA Index (/hr): 0.0 REM AHI (/hr): 70.6 NREM AHI (/hr): 69.7 Supine AHI (/hr): 69.9 Non-supine AHI (/hr): N/A Min O2 Sat (%): 74.0 Mean O2 (%): 91.0 Time below 88% (min): 35.5   Titration Optimal IPAP Pressure (cm):  Optimal EPAP Pressure (cm):  AHI at Optimal Pressure (/hr): N/A Min O2 at Optimal Pressure (%): 87.0 Sleep % at Optimal  (%): N/A Supine % at Optimal (%): N/A   SLEEP ARCHITECTURE The study was initiated at 9:45:37 PM and terminated at 5:18:30 AM. The total recorded time was 452.9 minutes. EEG confirmed total sleep time was 175.5 minutes yielding a sleep efficiency of 38.8%%. Sleep onset after lights out was 83.9 minutes with a REM latency of 67.5 minutes. The patient spent 16.2%% of the night in stage N1 sleep, 60.1%% in stage N2 sleep, 0.0%% in stage N3 and 23.7% in REM. Wake after sleep onset (WASO) was 193.5 minutes. The Arousal Index was 53.0/hour.  LEG MOVEMENT DATA The total Periodic Limb Movements of Sleep (PLMS) were 0. The PLMS index was 0.0 .  CARDIAC DATA The 2 lead EKG demonstrated sinus rhythm. The mean heart rate was 100.0 beats per minute. Other EKG findings include: Atrial Flutter.  IMPRESSIONS - Severe obstructive sleep apnea occurred during the diagnostic portion of the study (AHI  69.9 /h; REM AHI 70.6/h).  There was significant sleep fragmentation on the diagnotic evaluation. The patient did not tolerate CPAP.  BiPAP was implemented and was titrated to 12/8. There were frequent sleep onset central ervents.  An optimal BPAP pressure could not be selected for this patient based on the available study data. - Mild central sleep apnea occurred during the diagnostic portion of the study (CAI = 0.0/hour). - Significant oxygen desaturation during the diagnostic portion of the study to a nadir of 74%. - The patient snored with moderate snoring volume during the diagnostic portion of  the study. - EKG findings include Atrial Fibrillation. - Clinically significant periodic limb movements of sleep did not occur during the study.  DIAGNOSIS - Obstructive Sleep Apnea (327.23 [G47.33 ICD-10])  RECOMMENDATIONS - Recommend an initial trial of BPAP Auto with an EPAP min of 6, pressure support of 5, and IPAP max of 25 cm water. - Effort should be made to optimize nasal and oropharyngeal patency.  - Avoid  alcohol, sedatives and other CNS depressants that may worsen sleep apnea and disrupt normal sleep architecture. - Sleep hygiene should be reviewed to assess factors that may improve sleep quality. - Weight management (BMI 36) and regular exercise should be initiated or continued. - Recommend a download be obtained in 30 days and sleep clinic evaluation after 4 weeks of therapy.   [Electronically signed] 09/08/2018 12:14 PM  Shelva Majestic MD, [place], American Board of Sleep Medicine   NPI: [id number] [place] PH: [phone]   FX: [phone] [place]

## 2018-09-09 NOTE — Progress Notes (Signed)
Please make sure he has a follow up appointment with Dr. Tresa Endo or [name] as his sleep study was positive for severe OSA, to initiate CPAP or BiPAP.

## 2018-09-11 ENCOUNTER — Telehealth: Payer: Self-pay | Admitting: *Deleted

## 2018-09-11 DIAGNOSIS — J069 Acute upper respiratory infection, unspecified: Secondary | ICD-10-CM | POA: Diagnosis not present

## 2018-09-11 DIAGNOSIS — N529 Male erectile dysfunction, unspecified: Secondary | ICD-10-CM | POA: Diagnosis not present

## 2018-09-11 MED FILL — SILDENAFIL CITRATE 100 MG T: 100 | 30 days supply | Qty: 6 | Fill #0

## 2018-09-11 NOTE — Telephone Encounter (Signed)
BIPAP order sent to [facility].

## 2018-09-11 NOTE — Telephone Encounter (Signed)
-----   Message from Lennette Bihari, MD sent at 09/08/2018 12:18 PM EST ----- [name], please notify patient of the results.  Initiate BiPAP auto with DME company with follow-up sleep evaluation

## 2018-09-14 NOTE — Progress Notes (Signed)
D/w Wanda she will take care of this 

## 2018-11-05 DIAGNOSIS — G4733 Obstructive sleep apnea (adult) (pediatric): Secondary | ICD-10-CM | POA: Diagnosis not present

## 2018-12-05 DIAGNOSIS — G4733 Obstructive sleep apnea (adult) (pediatric): Secondary | ICD-10-CM | POA: Diagnosis not present

## 2019-01-05 DIAGNOSIS — G4733 Obstructive sleep apnea (adult) (pediatric): Secondary | ICD-10-CM | POA: Diagnosis not present

## 2019-01-08 ENCOUNTER — Telehealth: Payer: Self-pay

## 2019-01-08 NOTE — Telephone Encounter (Signed)
Attempted to contact pt to change appointment back to office visit as Dr. Gwenlyn Found will be in office that date. Left message to call back.

## 2019-01-11 DIAGNOSIS — H10412 Chronic giant papillary conjunctivitis, left eye: Secondary | ICD-10-CM | POA: Diagnosis not present

## 2019-01-11 DIAGNOSIS — H0015 Chalazion left lower eyelid: Secondary | ICD-10-CM | POA: Diagnosis not present

## 2019-01-11 MED FILL — PREDNISOLONE AC 1% EYE DROP: 1 | 14 days supply | Qty: 5 | Fill #0

## 2019-01-11 MED FILL — AMOX-CLAV 500-125 MG TABLET: 500-125 | 7 days supply | Qty: 15 | Fill #0

## 2019-01-16 ENCOUNTER — Encounter: Payer: Self-pay | Admitting: Cardiovascular Disease

## 2019-01-16 ENCOUNTER — Other Ambulatory Visit: Payer: Self-pay

## 2019-01-16 ENCOUNTER — Telehealth: Payer: Self-pay

## 2019-01-16 ENCOUNTER — Ambulatory Visit (INDEPENDENT_AMBULATORY_CARE_PROVIDER_SITE_OTHER): Payer: PPO | Admitting: Cardiovascular Disease

## 2019-01-16 DIAGNOSIS — I4819 Other persistent atrial fibrillation: Secondary | ICD-10-CM | POA: Diagnosis not present

## 2019-01-16 DIAGNOSIS — G4733 Obstructive sleep apnea (adult) (pediatric): Secondary | ICD-10-CM

## 2019-01-16 MED ORDER — METOPROLOL SUCCINATE ER 25 MG PO TB24: 25.0000 mg | ORAL_TABLET | Freq: Every day | ORAL | 3 refills | Status: DC

## 2019-01-16 MED FILL — METOPROLOL SUCCINATE ER 25: 25 | 90 days supply | Qty: 90 | Fill #0

## 2019-01-16 NOTE — Assessment & Plan Note (Signed)
History of obstructive sleep apnea on BiPAP followed by Dr. Kelly 

## 2019-01-16 NOTE — Assessment & Plan Note (Signed)
History of persistent atrial fibrillation with a ventricular response of 83 today.The CHA2DSVASC2 score is 1  .

## 2019-01-16 NOTE — Patient Instructions (Signed)
Medication Instructions:  Your physician recommends that you continue on your current medications as directed. Please refer to the Current Medication list given to you today.  If you need a refill on your cardiac medications before your next appointment, please call your pharmacy.   Lab work: NONE If you have labs (blood work) drawn today and your tests are completely normal, you will receive your results only by: . MyChart Message (if you have MyChart) OR . A paper copy in the mail If you have any lab test that is abnormal or we need to change your treatment, we will call you to review the results.  Testing/Procedures: NONE  Follow-Up: At [facility], you and your health needs are our priority.  As part of our continuing mission to provide you with exceptional heart care, we have created designated Provider Care Teams.  These Care Teams include your primary Cardiologist (physician) and Advanced Practice Providers (APPs -  Physician Assistants and Nurse Practitioners) who all work together to provide you with the care you need, when you need it. You will need a follow up appointment in 6 months WITH KATHRYN LAWRENCE, DNP AND IN 12 MONTHS WITH DR. BERRY. Please call our office 2 months in advance to schedule this appointment.   

## 2019-01-16 NOTE — Telephone Encounter (Signed)
    COVID-19 Pre-Screening Questions:  . In the past 7 to 10 days have you had a cough,  shortness of breath, headache, congestion, fever (100 or greater) body aches, chills, sore throat, or sudden loss of taste or sense of smell? NO . Have you been around anyone with known Covid 19? NO . Have you been around anyone who is awaiting Covid 19 test results in the past 7 to 10 days? NO . Have you been around anyone who has been exposed to Covid 19, or has mentioned symptoms of Covid 19 within the past 7 to 10 days? NO  If you have any concerns/questions about symptoms patients report during screening (either on the phone or at threshold). Contact the provider seeing the patient or DOD for further guidance.  If neither are available contact a member of the leadership team.    PT AWARE OF RESTRICTIONS IN THE OFFICE FOR MASK AND THOSE ACCOMPANYING THE PT.    WILL PRESENT FOR 7/1 APPT AT 1:30PM

## 2019-01-16 NOTE — Progress Notes (Signed)
01/16/2019 [place]   08-18-1950  [id number]  Primary Physician Alroy Dust, L.Marlou Sa, MD Primary Cardiologist: Lorretta Harp MD Phillip Shannon, [place]  HPI:  Phillip Shannon is a 68 y.o.  mildly overweight married African-American male father of 2 biologic and 2 stepchildren, grandfather to 33 grandchildren referred by Dr. Donnie Coffin for evaluation of PAF.I last saw him in the office  08/16/2017.He works as a Teacher, early years/pre from [employer] He has no coronary vessel risk factors. He has never had a heart attack or stroke and denies chest pain or shortness of breath. On routine exam recently Dr. Alroy Dust noted an irregular heartbeat and confirmed PAF on a 12-lead cardiogram.[name] is in A. fib with controlled ventricular response today.The CHA2DSVASC2 score is1. He denies chest pain, shortness of breath or dizziness. He did have a recent 2-D echocardiogram that was essentially normal .  He had a sleep study that showed severe sleep apnea and is currently on BiPAP followed by Dr. Claiborne Billings with marked improvement in his symptoms.  Since I saw him a year ago he is remained stable.  He has no symptoms from his A. fib and is unaware that he is in it.  He denies chest pain or shortness of breath.  Current Meds  Medication Sig  . aspirin EC 81 MG tablet Take 1 tablet (81 mg total) by mouth daily.  . metoprolol succinate (TOPROL XL) 25 MG 24 hr tablet Take 1 tablet (25 mg total) by mouth at bedtime.     Allergies  Allergen Reactions  . Hornet Venom Anaphylaxis, Hives and Swelling    Swelling all over   . Codeine Nausea And Vomiting    Social History   Socioeconomic History  . Marital status: Married    Spouse name: Not on file  . Number of children: 2  . Years of education: Not on file  . Highest education level: Not on file  Occupational History  . Occupation: school bus driver  Social Needs  . Financial resource strain: Not on file  . Food insecurity   Worry: Not on file    Inability: Not on file  . Transportation needs    Medical: Not on file    Non-medical: Not on file  Tobacco Use  . Smoking status: Never Smoker  . Smokeless tobacco: Never Used  Substance and Sexual Activity  . Alcohol use: No  . Drug use: No  . Sexual activity: Not on file  Lifestyle  . Physical activity    Days per week: Not on file    Minutes per session: Not on file  . Stress: Not on file  Relationships  . Social Herbalist on phone: Not on file    Gets together: Not on file    Attends religious service: Not on file    Active member of club or organization: Not on file    Attends meetings of clubs or organizations: Not on file    Relationship status: Not on file  . Intimate partner violence    Fear of current or ex partner: Not on file    Emotionally abused: Not on file    Physically abused: Not on file    Forced sexual activity: Not on file  Other Topics Concern  . Not on file  Social History Narrative   Lives with his wife.   Adult children do not live locally.     Review of Systems: General: negative  for chills, fever, night sweats or weight changes.  Cardiovascular: negative for chest pain, dyspnea on exertion, edema, orthopnea, palpitations, paroxysmal nocturnal dyspnea or shortness of breath Dermatological: negative for rash Respiratory: negative for cough or wheezing Urologic: negative for hematuria Abdominal: negative for nausea, vomiting, diarrhea, bright red blood per rectum, melena, or hematemesis Neurologic: negative for visual changes, syncope, or dizziness All other systems reviewed and are otherwise negative except as noted above.    Blood pressure 110/74, pulse 83, temperature (!) 97 F (36.1 C), height 5\' 10"  (1.778 m), weight 267 lb (121.1 kg).  General appearance: alert and no distress Neck: no adenopathy, no carotid bruit, no JVD, supple, symmetrical, trachea midline and thyroid not enlarged, symmetric, no  tenderness/mass/nodules Lungs: clear to auscultation bilaterally Heart: irregularly irregular rhythm Extremities: extremities normal, atraumatic, no cyanosis or edema Pulses: 2+ and symmetric Skin: Skin color, texture, turgor normal. No rashes or lesions Neurologic: Alert and oriented X 3, normal strength and tone. Normal symmetric reflexes. Normal coordination and gait  EKG atrial fibrillation with a ventricular spots of 83.  ASSESSMENT AND PLAN:   Atrial fibrillation, persistent (HCC) History of persistent atrial fibrillation with a ventricular response of 83 today.The CHA2DSVASC2 score is 1  .  Sleep apnea History of obstructive sleep apnea on BiPAP followed by Dr. Bonnielee HaffKelly      Conya Ellinwood J. Syra Sirmons MD [facility] Medical CenterFACP,FACC,FAHA, [facility]FSCAI 01/16/2019 1:59 PM

## 2019-02-04 ENCOUNTER — Other Ambulatory Visit: Payer: Self-pay | Admitting: *Deleted

## 2019-02-04 DIAGNOSIS — G4733 Obstructive sleep apnea (adult) (pediatric): Secondary | ICD-10-CM | POA: Diagnosis not present

## 2019-02-04 DIAGNOSIS — Z20822 Contact with and (suspected) exposure to covid-19: Secondary | ICD-10-CM

## 2019-02-04 NOTE — Addendum Note (Signed)
Addended by: Virdell Hoiland M on: 02/04/2019 08:47 PM   Modules accepted: Orders  

## 2019-02-06 LAB — NOVEL CORONAVIRUS, NAA: SARS-CoV-2, NAA: NOT DETECTED

## 2019-02-07 DIAGNOSIS — M5440 Lumbago with sciatica, unspecified side: Secondary | ICD-10-CM | POA: Diagnosis not present

## 2019-02-07 MED FILL — tiZANidine HCL 4 MG TABS: 4 | 15 days supply | Qty: 60 | Fill #0

## 2019-02-07 MED FILL — IBUPROFEN 600 MG TABLET: 600 | 30 days supply | Qty: 90 | Fill #0

## 2019-02-22 DIAGNOSIS — Z125 Encounter for screening for malignant neoplasm of prostate: Secondary | ICD-10-CM | POA: Diagnosis not present

## 2019-02-22 DIAGNOSIS — Z0000 Encounter for general adult medical examination without abnormal findings: Secondary | ICD-10-CM | POA: Diagnosis not present

## 2019-02-22 DIAGNOSIS — E7800 Pure hypercholesterolemia, unspecified: Secondary | ICD-10-CM | POA: Diagnosis not present

## 2019-02-22 DIAGNOSIS — E669 Obesity, unspecified: Secondary | ICD-10-CM | POA: Diagnosis not present

## 2019-02-22 DIAGNOSIS — N529 Male erectile dysfunction, unspecified: Secondary | ICD-10-CM | POA: Diagnosis not present

## 2019-02-22 DIAGNOSIS — I4891 Unspecified atrial fibrillation: Secondary | ICD-10-CM | POA: Diagnosis not present

## 2019-02-27 DIAGNOSIS — Z125 Encounter for screening for malignant neoplasm of prostate: Secondary | ICD-10-CM | POA: Diagnosis not present

## 2019-02-27 DIAGNOSIS — E7800 Pure hypercholesterolemia, unspecified: Secondary | ICD-10-CM | POA: Diagnosis not present

## 2019-03-07 DIAGNOSIS — G4733 Obstructive sleep apnea (adult) (pediatric): Secondary | ICD-10-CM | POA: Diagnosis not present

## 2019-03-12 DIAGNOSIS — Z6837 Body mass index (BMI) 37.0-37.9, adult: Secondary | ICD-10-CM | POA: Diagnosis not present

## 2019-03-12 DIAGNOSIS — R030 Elevated blood-pressure reading, without diagnosis of hypertension: Secondary | ICD-10-CM | POA: Diagnosis not present

## 2019-03-12 DIAGNOSIS — M5440 Lumbago with sciatica, unspecified side: Secondary | ICD-10-CM | POA: Diagnosis not present

## 2019-04-07 DIAGNOSIS — G4733 Obstructive sleep apnea (adult) (pediatric): Secondary | ICD-10-CM | POA: Diagnosis not present

## 2019-04-15 DIAGNOSIS — R972 Elevated prostate specific antigen [PSA]: Secondary | ICD-10-CM | POA: Diagnosis not present

## 2019-04-15 DIAGNOSIS — R351 Nocturia: Secondary | ICD-10-CM | POA: Diagnosis not present

## 2019-04-15 DIAGNOSIS — N401 Enlarged prostate with lower urinary tract symptoms: Secondary | ICD-10-CM | POA: Diagnosis not present

## 2019-04-15 MED FILL — levoFLOXacin 750 MG TABS: 750 | 1 days supply | Qty: 1 | Fill #0

## 2019-04-17 DIAGNOSIS — G4733 Obstructive sleep apnea (adult) (pediatric): Secondary | ICD-10-CM | POA: Diagnosis not present

## 2019-05-07 DIAGNOSIS — G4733 Obstructive sleep apnea (adult) (pediatric): Secondary | ICD-10-CM | POA: Diagnosis not present

## 2019-05-11 DIAGNOSIS — Z23 Encounter for immunization: Secondary | ICD-10-CM | POA: Diagnosis not present

## 2019-05-21 DIAGNOSIS — M5440 Lumbago with sciatica, unspecified side: Secondary | ICD-10-CM | POA: Diagnosis not present

## 2019-05-29 DIAGNOSIS — G4733 Obstructive sleep apnea (adult) (pediatric): Secondary | ICD-10-CM | POA: Diagnosis not present

## 2019-05-31 MED FILL — IBUPROFEN 600 MG TABLET: 600 | 30 days supply | Qty: 90 | Fill #1

## 2019-05-31 MED FILL — SILDENAFIL CITRATE 100 MG T: 100 | 30 days supply | Qty: 6 | Fill #1

## 2019-06-07 DIAGNOSIS — G4733 Obstructive sleep apnea (adult) (pediatric): Secondary | ICD-10-CM | POA: Diagnosis not present

## 2019-06-24 DIAGNOSIS — I4891 Unspecified atrial fibrillation: Secondary | ICD-10-CM | POA: Diagnosis not present

## 2019-06-24 DIAGNOSIS — R42 Dizziness and giddiness: Secondary | ICD-10-CM | POA: Diagnosis not present

## 2019-06-24 DIAGNOSIS — R030 Elevated blood-pressure reading, without diagnosis of hypertension: Secondary | ICD-10-CM | POA: Diagnosis not present

## 2019-06-25 ENCOUNTER — Ambulatory Visit: Payer: PPO | Admitting: Cardiovascular Disease

## 2019-06-25 ENCOUNTER — Encounter: Payer: Self-pay | Admitting: Cardiovascular Disease

## 2019-06-25 ENCOUNTER — Other Ambulatory Visit: Payer: Self-pay

## 2019-06-25 VITALS — BP 132/82 | HR 67 | Temp 96.6°F | Ht 70.0 in | Wt 266.0 lb

## 2019-06-25 DIAGNOSIS — Z79899 Other long term (current) drug therapy: Secondary | ICD-10-CM | POA: Diagnosis not present

## 2019-06-25 DIAGNOSIS — I480 Paroxysmal atrial fibrillation: Secondary | ICD-10-CM

## 2019-06-25 MED ORDER — ATORVASTATIN CALCIUM 20 MG PO TABS: 20.0000 mg | ORAL_TABLET | Freq: Every day | ORAL | 3 refills | Status: DC

## 2019-06-25 MED FILL — ATORVASTATIN 20 MG TABLET: 20 | 90 days supply | Qty: 90 | Fill #0

## 2019-06-25 NOTE — Assessment & Plan Note (Signed)
History of chronic A. fib rate controlled on metoprolol not on anticoagulation because of low CHA2DS2-VASc of 1.

## 2019-06-25 NOTE — Progress Notes (Signed)
06/25/2019 Phillip Shannon [facility]   Phillip Shannon  Phillip Shannon  Primary Physician Phillip Shannon, L.Phillip Saucer, MD Primary Cardiologist: Phillip Gess MD Phillip Shannon, [place]  HPI:  Phillip Shannon is a 68 y.o.  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 He has no coronary vessel risk factors. He has never had a heart attack or stroke and denies chest pain or shortness of breath. On routine exam recently Phillip Shannon noted an irregular heartbeat and confirmed PAF on a 12-lead cardiogram.Phillip Shannon is in A. fib with controlled ventricular response today.The CHA2DSVASC2 score is1. He denies chest pain, shortness of breath or dizziness.He did have a recent 2-D echocardiogram that was essentially normal .  He had a sleep study that showed severe sleep apnea and is currently on BiPAP followed by Dr. Tresa Shannon with marked improvement in his symptoms.  Since I saw him a year ago he is remained stable.  He has no symptoms from his A. fib and is unaware that he is in it.  He denies chest pain or shortness of breath.  He does have obstructive sleep apnea on BiPAP followed by Dr. Tresa Shannon.  He has not worn his device for several months because of broken headgear which will help rectify.   Current Meds  Medication Sig  . aspirin EC 81 MG tablet Take 1 tablet (81 mg total) by mouth daily.  . metoprolol succinate (TOPROL XL) 25 MG 24 hr tablet Take 1 tablet (25 mg total) by mouth at bedtime.     Allergies  Allergen Reactions  . Hornet Venom Anaphylaxis, Hives and Swelling    Swelling all over   . Codeine Nausea And Vomiting    Social History   Socioeconomic History  . Marital status: Married    Spouse name: Not on file  . Number of children: 2  . Years of education: Not on file  .  Highest education level: Not on file  Occupational History  . Occupation: school bus driver  Social Needs  . Financial resource strain: Not on file  . Food insecurity    Worry: Not on file    Inability: Not on file  . Transportation needs    Medical: Not on file    Non-medical: Not on file  Tobacco Use  . Smoking status: Never Smoker  . Smokeless tobacco: Never Used  Substance and Sexual Activity  . Alcohol use: No  . Drug use: No  . Sexual activity: Not on file  Lifestyle  . Physical activity    Days per week: Not on file    Minutes per session: Not on file  . Stress: Not on file  Relationships  . Social Musician on phone: Not on file    Gets together: Not on file    Attends religious service: Not on file    Active member of club or organization: Not on file    Attends meetings of clubs or organizations: Not on file    Relationship status: Not on file  . Intimate partner violence    Fear of current or ex partner: Not on file    Emotionally abused: Not on file    Physically abused: Not on file    Forced sexual activity: Not on file  Other Topics  Concern  . Not on file  Social History Narrative   Lives with his wife.   Adult children do not live locally.     Review of Systems: General: negative for chills, fever, night sweats or weight changes.  Cardiovascular: negative for chest pain, dyspnea on exertion, edema, orthopnea, palpitations, paroxysmal nocturnal dyspnea or shortness of breath Dermatological: negative for rash Respiratory: negative for cough or wheezing Urologic: negative for hematuria Abdominal: negative for nausea, vomiting, diarrhea, bright red blood per rectum, melena, or hematemesis Neurologic: negative for visual changes, syncope, or dizziness All other systems reviewed and are otherwise negative except as noted above.    Blood pressure 132/82, pulse 67, temperature (!) 96.6 F (35.9 C), height 5\' 10"  (1.778 m), weight 266 lb (120.7  kg).  General appearance: alert and no distress Neck: no adenopathy, no carotid bruit, no JVD, supple, symmetrical, trachea midline and thyroid not enlarged, symmetric, no tenderness/mass/nodules Lungs: clear to auscultation bilaterally Heart: irregularly irregular rhythm Extremities: extremities normal, atraumatic, no cyanosis or edema Pulses: 2+ and symmetric Skin: Skin color, texture, turgor normal. No rashes or lesions Neurologic: Alert and oriented X 3, normal strength and tone. Normal symmetric reflexes. Normal coordination and gait  EKG not performed today  ASSESSMENT AND PLAN:   Atrial fibrillation, persistent (HCC) History of chronic A. fib rate controlled on metoprolol not on anticoagulation because of low CHA2DS2-VASc of 1.  Sleep apnea History of obstructive sleep apnea on BiPAP followed by Dr. Claiborne Shannon.  He has not worn his BiPAP in several months because of broken headgear.  We will facilitate getting him the appropriate device      Lorretta Harp MD [facility], [facility] 06/25/2019 12:00 PM

## 2019-06-25 NOTE — Assessment & Plan Note (Signed)
History of obstructive sleep apnea on BiPAP followed by Dr. Claiborne Billings.  He has not worn his BiPAP in several months because of broken headgear.  We will facilitate getting him the appropriate device

## 2019-06-25 NOTE — Patient Instructions (Signed)
Medication Instructions:  Start taking 20mg  Atorvastatin daily.   If you need a refill on your cardiac medications before your next appointment, please call your pharmacy.   Lab work: Lipids and Hepatic Function in 2 months If you have labs (blood work) drawn today and your tests are completely normal, you will receive your results only by: Phillip Shannon (if you have MyChart) OR A paper copy in the mail If you have any lab test that is abnormal or we need to change your treatment, we will call you to review the results.  Testing/Procedures: NONE  Follow-Up: At [facility], you and your health needs are our priority.  As part of our continuing mission to provide you with exceptional heart care, we have created designated Provider Care Teams.  These Care Teams include your primary Cardiologist (physician) and Advanced Practice Providers (APPs -  Physician Assistants and Nurse Practitioners) who all work together to provide you with the care you need, when you need it. You may see Quay Burow, MD or one of the following Advanced Practice Providers on your designated Care Team:    Kerin Ransom, PA-C  [place], [place]  Coletta Memos, [place] Your physician wants you to follow-up in: 1 year

## 2019-07-07 DIAGNOSIS — G4733 Obstructive sleep apnea (adult) (pediatric): Secondary | ICD-10-CM | POA: Diagnosis not present

## 2019-08-07 DIAGNOSIS — G4733 Obstructive sleep apnea (adult) (pediatric): Secondary | ICD-10-CM | POA: Diagnosis not present

## 2019-08-21 DIAGNOSIS — R972 Elevated prostate specific antigen [PSA]: Secondary | ICD-10-CM | POA: Diagnosis not present

## 2019-09-07 DIAGNOSIS — G4733 Obstructive sleep apnea (adult) (pediatric): Secondary | ICD-10-CM | POA: Diagnosis not present

## 2019-09-15 ENCOUNTER — Ambulatory Visit: Payer: PPO | Attending: Internal Medicine

## 2019-09-15 DIAGNOSIS — Z23 Encounter for immunization: Secondary | ICD-10-CM

## 2019-09-15 NOTE — Progress Notes (Signed)
   Covid-19 Vaccination Clinic  Name:  JASMOND RIVER    MRN: [id number] DOB: 10-28-1950  09/15/2019  [name] was observed post Covid-19 immunization for 15 minutes without incidence. He was provided with Vaccine Information Sheet and instruction to access the V-Safe system.   Mr. Wyndham was instructed to call 911 with any severe reactions post vaccine: Marland Kitchen Difficulty breathing  . Swelling of your face and throat  . A fast heartbeat  . A bad rash all over your body  . Dizziness and weakness    Immunizations Administered    Name Date Dose VIS Date Route   Pfizer COVID-19 Vaccine 09/15/2019  5:00 PM 0.3 mL 06/28/2019 Intramuscular   Manufacturer: [employer], [employer]   Lot: [id number]   NDC: [address]-2

## 2019-10-05 DIAGNOSIS — G4733 Obstructive sleep apnea (adult) (pediatric): Secondary | ICD-10-CM | POA: Diagnosis not present

## 2019-10-16 ENCOUNTER — Ambulatory Visit: Payer: PPO | Attending: Internal Medicine

## 2019-10-16 DIAGNOSIS — Z23 Encounter for immunization: Secondary | ICD-10-CM

## 2019-10-16 NOTE — Progress Notes (Signed)
   Covid-19 Vaccination Clinic  Name:  Phillip Shannon    MRN: [id number] DOB: [birth year]  10/16/2019  [name] was observed post Covid-19 immunization for 30 minutes based on pre-vaccination screening without incident. He was provided with Vaccine Information Sheet and instruction to access the V-Safe system.   [name] was instructed to call 911 with any severe reactions post vaccine: Marland Kitchen Difficulty breathing  . Swelling of face and throat  . A fast heartbeat  . A bad rash all over body  . Dizziness and weakness   Immunizations Administered    Name Date Dose VIS Date Route   Pfizer COVID-19 Vaccine 10/16/2019 11:05 AM 0.3 mL 06/28/2019 Intramuscular   Manufacturer: [employer], [employer]   Lot: [id number]   NDC: [address]-3

## 2019-11-13 DIAGNOSIS — R197 Diarrhea, unspecified: Secondary | ICD-10-CM | POA: Diagnosis not present

## 2019-11-13 DIAGNOSIS — R1110 Vomiting, unspecified: Secondary | ICD-10-CM | POA: Diagnosis not present

## 2020-02-04 IMAGING — CR DG FINGER INDEX 2+V*L*
3 series · 3 of 3 positions shown · non-contrast
Comparison: None.

CLINICAL DATA: Left index finger laceration.

EXAM:
LEFT INDEX FINGER 2+V

[x finger pa left]
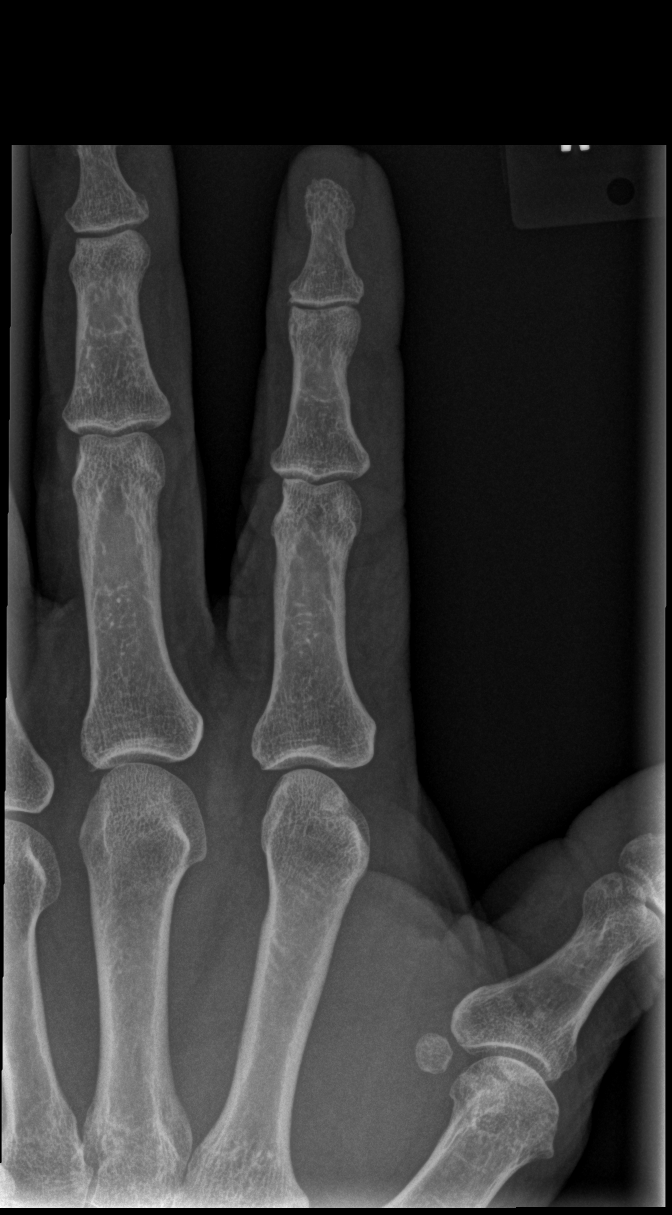

[x finger obl left]
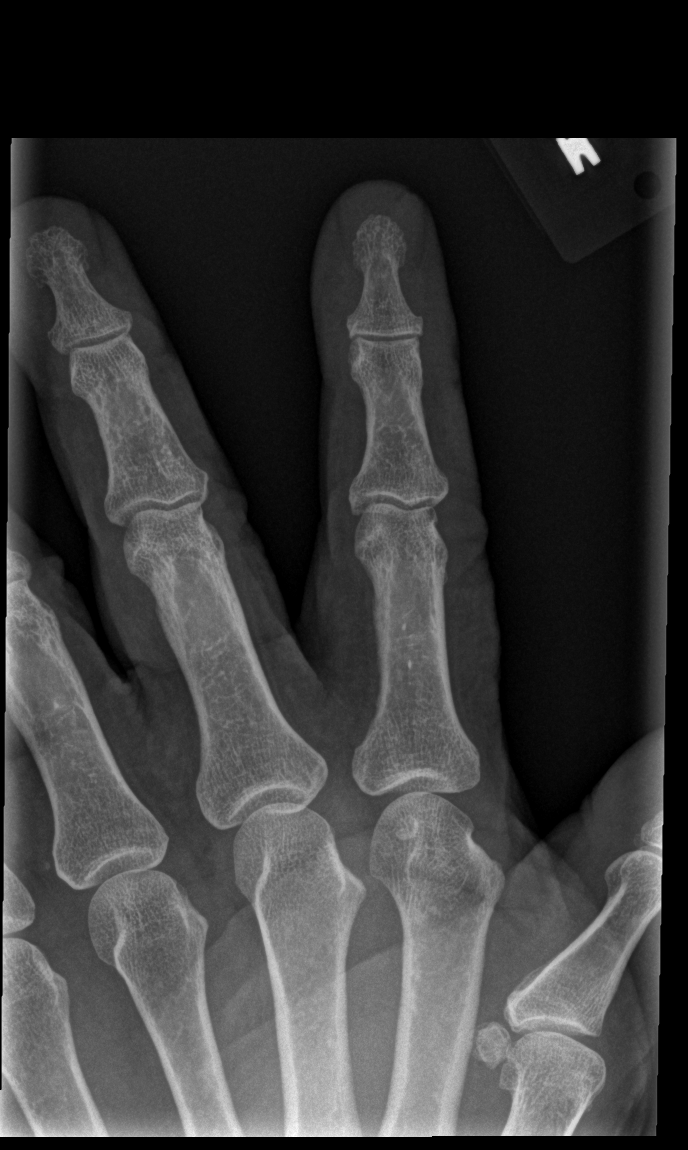

[x finger lat left]
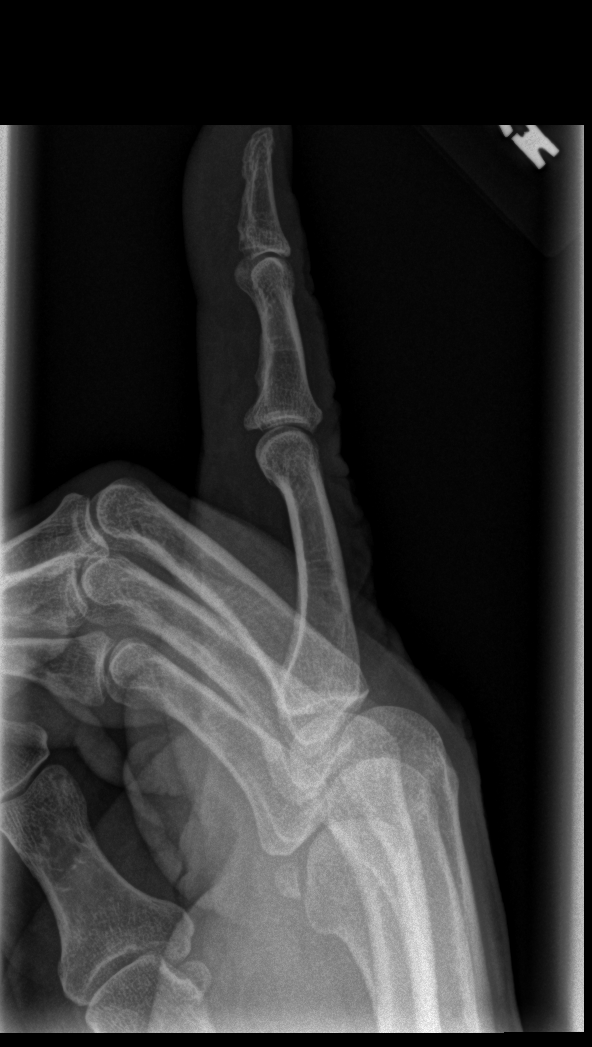

[3 of 3 positions shown; findings below may reference images not displayed]

FINDINGS: Small soft tissue defect at the tip of the index finger. No
radiopaque foreign body. No acute fracture or dislocation. Joint
spaces are preserved.
IMPRESSION: 1. Soft tissue laceration at the tip of the index finger. No
radiopaque foreign body.
2. No acute osseous abnormality.

## 2020-02-24 DIAGNOSIS — E7800 Pure hypercholesterolemia, unspecified: Secondary | ICD-10-CM | POA: Diagnosis not present

## 2020-02-24 DIAGNOSIS — E669 Obesity, unspecified: Secondary | ICD-10-CM | POA: Diagnosis not present

## 2020-02-24 DIAGNOSIS — Z0000 Encounter for general adult medical examination without abnormal findings: Secondary | ICD-10-CM | POA: Diagnosis not present

## 2020-02-24 DIAGNOSIS — N529 Male erectile dysfunction, unspecified: Secondary | ICD-10-CM | POA: Diagnosis not present

## 2020-02-24 DIAGNOSIS — R972 Elevated prostate specific antigen [PSA]: Secondary | ICD-10-CM | POA: Diagnosis not present

## 2020-02-24 DIAGNOSIS — I4891 Unspecified atrial fibrillation: Secondary | ICD-10-CM | POA: Diagnosis not present

## 2020-02-24 MED FILL — SILDENAFIL CITRATE 100 MG T: 100 | 6 days supply | Qty: 6 | Fill #0

## 2020-03-04 MED FILL — ATORVASTATIN 40 MG TABLET: 40 | 30 days supply | Qty: 30 | Fill #0

## 2020-04-25 DIAGNOSIS — Z23 Encounter for immunization: Secondary | ICD-10-CM | POA: Diagnosis not present

## 2020-06-01 DIAGNOSIS — H35373 Puckering of macula, bilateral: Secondary | ICD-10-CM | POA: Diagnosis not present

## 2020-06-01 DIAGNOSIS — H52202 Unspecified astigmatism, left eye: Secondary | ICD-10-CM | POA: Diagnosis not present

## 2020-06-01 DIAGNOSIS — H04123 Dry eye syndrome of bilateral lacrimal glands: Secondary | ICD-10-CM | POA: Diagnosis not present

## 2020-06-24 ENCOUNTER — Ambulatory Visit: Payer: PPO | Admitting: Cardiovascular Disease

## 2020-06-24 ENCOUNTER — Other Ambulatory Visit: Payer: Self-pay

## 2020-06-24 ENCOUNTER — Encounter: Payer: Self-pay | Admitting: Cardiovascular Disease

## 2020-06-24 DIAGNOSIS — I4819 Other persistent atrial fibrillation: Secondary | ICD-10-CM

## 2020-06-24 DIAGNOSIS — E782 Mixed hyperlipidemia: Secondary | ICD-10-CM | POA: Diagnosis not present

## 2020-06-24 DIAGNOSIS — G4733 Obstructive sleep apnea (adult) (pediatric): Secondary | ICD-10-CM | POA: Diagnosis not present

## 2020-06-24 DIAGNOSIS — E669 Obesity, unspecified: Secondary | ICD-10-CM | POA: Diagnosis not present

## 2020-06-24 DIAGNOSIS — E785 Hyperlipidemia, unspecified: Secondary | ICD-10-CM | POA: Insufficient documentation

## 2020-06-24 NOTE — Assessment & Plan Note (Signed)
History of obesity with a BMI of 38.  I am going to refer him [facility] Dal[facility].

## 2020-06-24 NOTE — Assessment & Plan Note (Signed)
History of obstructive sleep apnea on BiPAP 

## 2020-06-24 NOTE — Progress Notes (Signed)
06/24/2020 Phillip Shannon [facility]   02/06/1951  [id number]  Primary Physician Clovis Riley, L.August Saucer, MD Primary Cardiologist: Runell Gess MD Nicholes Cal[place], [place]  HPI:  Phillip Shannon is a 69 y.o.  mildly overweight married African-American male father of 2 biologic and 2 stepchildren, grandfather to 10 grandchildren referred by Dr. [name] for evaluation of PAF.I last saw him in the office  06/25/2019.Marland KitchenHe works as a Surveyor, mining from [employer]. He has no coronary vessel risk factors. He has never had a heart attack or stroke and denies chest pain or shortness of breath. On routine exam recently Dr. Clovis Riley noted an irregular heartbeat and confirmed PAF on a 12-lead cardiogram.Phillip Shannon is in A. fib with controlled ventricular response today.The CHA2DSVASC2 score is1. He denies chest pain, shortness of breath or dizziness.He did have a recent 2-D echocardiogram that was essentially normal.He had a sleep study that showed severe sleep apnea and is currently on BiPAP followed by Dr. Tresa Endo with marked improvement in his symptoms.   He has no symptoms from his A. fib and is unaware that he is in it. He denies chest pain or shortness of breath.  He does have obstructive sleep apnea on BiPAP followed by Dr. Tresa Endo.  The CHA2DSVASC2 score is 1 and therefore he was not started on her oral anticoagulant but is on a baby aspirin.  His PCP recently increase his atorvastatin from 20 to 40 mg a day because of a mildly abnormal lipid profile..  Current Meds  Medication Sig  . aspirin EC 81 MG tablet Take 1 tablet (81 mg total) by mouth daily.  . metoprolol succinate (TOPROL XL) 25 MG 24 hr tablet Take 1 tablet (25 mg total) by mouth at bedtime.  . [DISCONTINUED] atorvastatin (LIPITOR) 20 MG tablet Take 1 tablet (20 mg total) by mouth daily.     Allergies  Allergen Reactions  . Hornet Venom Anaphylaxis, Hives and Swelling    Swelling all over   . Codeine Nausea And  Vomiting    Social History   Socioeconomic History  . Marital status: Married    Spouse name: Not on file  . Number of children: 2  . Years of education: Not on file  . Highest education level: Not on file  Occupational History  . Occupation: school bus driver  Tobacco Use  . Smoking status: Never Smoker  . Smokeless tobacco: Never Used  Substance and Sexual Activity  . Alcohol use: No  . Drug use: No  . Sexual activity: Not on file  Other Topics Concern  . Not on file  Social History Narrative   Lives with his wife.   Adult children do not live locally.   Social Determinants of Health   Financial Resource Strain:   . Difficulty of Paying Living Expenses: Not on file  Food Insecurity:   . Worried About Programme researcher, broadcasting/film/video in the Last Year: Not on file  . Ran Out of Food in the Last Year: Not on file  Transportation Needs:   . Lack of Transportation (Medical): Not on file  . Lack of Transportation (Non-Medical): Not on file  Physical Activity:   . Days of Exercise per Week: Not on file  . Minutes of Exercise per Session: Not on file  Stress:   . Feeling of Stress : Not on file  Social Connections:   . Frequency of Communication with Friends and Family: Not on file  . Frequency of Social  Gatherings with Friends and Family: Not on file  . Attends Religious Services: Not on file  . Active Member of Clubs or Organizations: Not on file  . Attends Banker Meetings: Not on file  . Marital Status: Not on file  Intimate Partner Violence:   . Fear of Current or Ex-Partner: Not on file  . Emotionally Abused: Not on file  . Physically Abused: Not on file  . Sexually Abused: Not on file     Review of Systems: General: negative for chills, fever, night sweats or weight changes.  Cardiovascular: negative for chest pain, dyspnea on exertion, edema, orthopnea, palpitations, paroxysmal nocturnal dyspnea or shortness of breath Dermatological: negative for  rash Respiratory: negative for cough or wheezing Urologic: negative for hematuria Abdominal: negative for nausea, vomiting, diarrhea, bright red blood per rectum, melena, or hematemesis Neurologic: negative for visual changes, syncope, or dizziness All other systems reviewed and are otherwise negative except as noted above.    Blood pressure 138/80, pulse 81, height 5\' 10"  (1.778 m), weight 268 lb (121.6 kg).  General appearance: alert and no distress Neck: no adenopathy, no carotid bruit, no JVD, supple, symmetrical, trachea midline and thyroid not enlarged, symmetric, no tenderness/mass/nodules Lungs: clear to auscultation bilaterally Heart: irregularly irregular rhythm Extremities: extremities normal, atraumatic, no cyanosis or edema Pulses: 2+ and symmetric Skin: Skin color, texture, turgor normal. No rashes or lesions Neurologic: Alert and oriented X 3, normal strength and tone. Normal symmetric reflexes. Normal coordination and gait  EKG atrial fibrillation with a ventricular response of 81 and nonspecific ST and T wave changes.  I personally reviewed this EKG.  ASSESSMENT AND PLAN:   Atrial fibrillation, persistent (HCC) History of persistent A. fib rate controlled on low-dose beta-blocker.  Is unaware that he is in this rhythm. The CHA2DSVASC2 score is 1  .  As result he is not on an oral anticoagulant but is on a baby aspirin.  Obesity (BMI 30.0-34.9) History of obesity with a BMI of 38.  I am going to refer him [facility] Dr[facility] 11-19-1992 [facility].  Sleep apnea History of obstructive sleep apnea on BiPAP  Hyperlipidemia History of hyperlipidemia on atorvastatin 20 mg a day until recently when it was increased to 40 mg a day by his PCP as result of his recent lipid profile performed 02/24/2020 revealing a total cholesterol 205, LDL 138 and HDL of 52.      04/25/2020 MD FACP,FACC,FAHA, [facility] 06/24/2020 10:52 AM

## 2020-06-24 NOTE — Assessment & Plan Note (Signed)
History of hyperlipidemia on atorvastatin 20 mg a day until recently when it was increased to 40 mg a day by his PCP as result of his recent lipid profile performed 02/24/2020 revealing a total cholesterol 205, LDL 138 and HDL of 52.

## 2020-06-24 NOTE — Patient Instructions (Addendum)
Medication Instructions:  Your physician recommends that you continue on your current medications as directed. Please refer to the Current Medication list given to you today.  *If you need a refill on your cardiac medications before your next appointment, please call your pharmacy*  Follow-Up: At [facility], you and your health needs are our priority.  As part of our continuing mission to provide you with exceptional heart care, we have created designated Provider Care Teams.  These Care Teams include your primary Cardiologist (physician) and Advanced Practice Providers (APPs -  Physician Assistants and Nurse Practitioners) who all work together to provide you with the care you need, when you need it.  We recommend signing up for the patient portal called "MyChart".  Sign up information is provided on this After Visit Summary.  MyChart is used to connect with patients for Virtual Visits (Telemedicine).  Patients are able to view lab/test results, encounter notes, upcoming appointments, etc.  Non-urgent messages can be sent to your provider as well.   To learn more about what you can do with MyChart, go to [email or web address].    Your next appointment:   12 month(s)  The format for your next appointment:   In Person  Provider:   Nanetta Batty, MD   Other Instructions [facility] with Dr. Dalbert Garnet. [phone]

## 2020-06-24 NOTE — Assessment & Plan Note (Signed)
History of persistent A. fib rate controlled on low-dose beta-blocker.  Is unaware that he is in this rhythm. The CHA2DSVASC2 score is 1  .  As result he is not on an oral anticoagulant but is on a baby aspirin.

## 2020-07-08 IMAGING — CR DG KNEE COMPLETE 4+V*L*
4 series · 4 of 4 positions shown · non-contrast
Comparison: None.

CLINICAL DATA: Left knee pain after fall.

EXAM:
LEFT KNEE - COMPLETE 4+ VIEW

[x knee ap left]
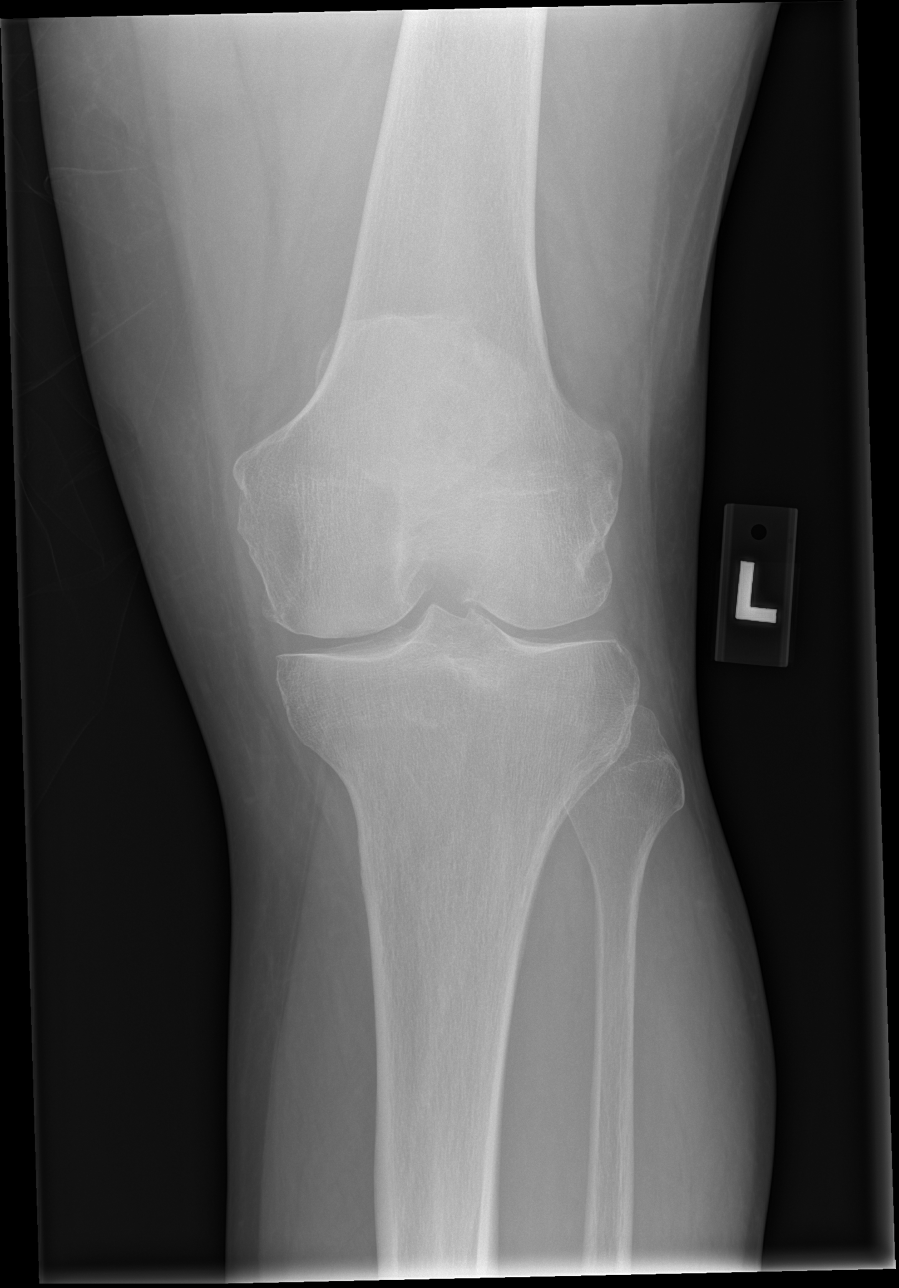

[x knee obl left (1 of 2)]
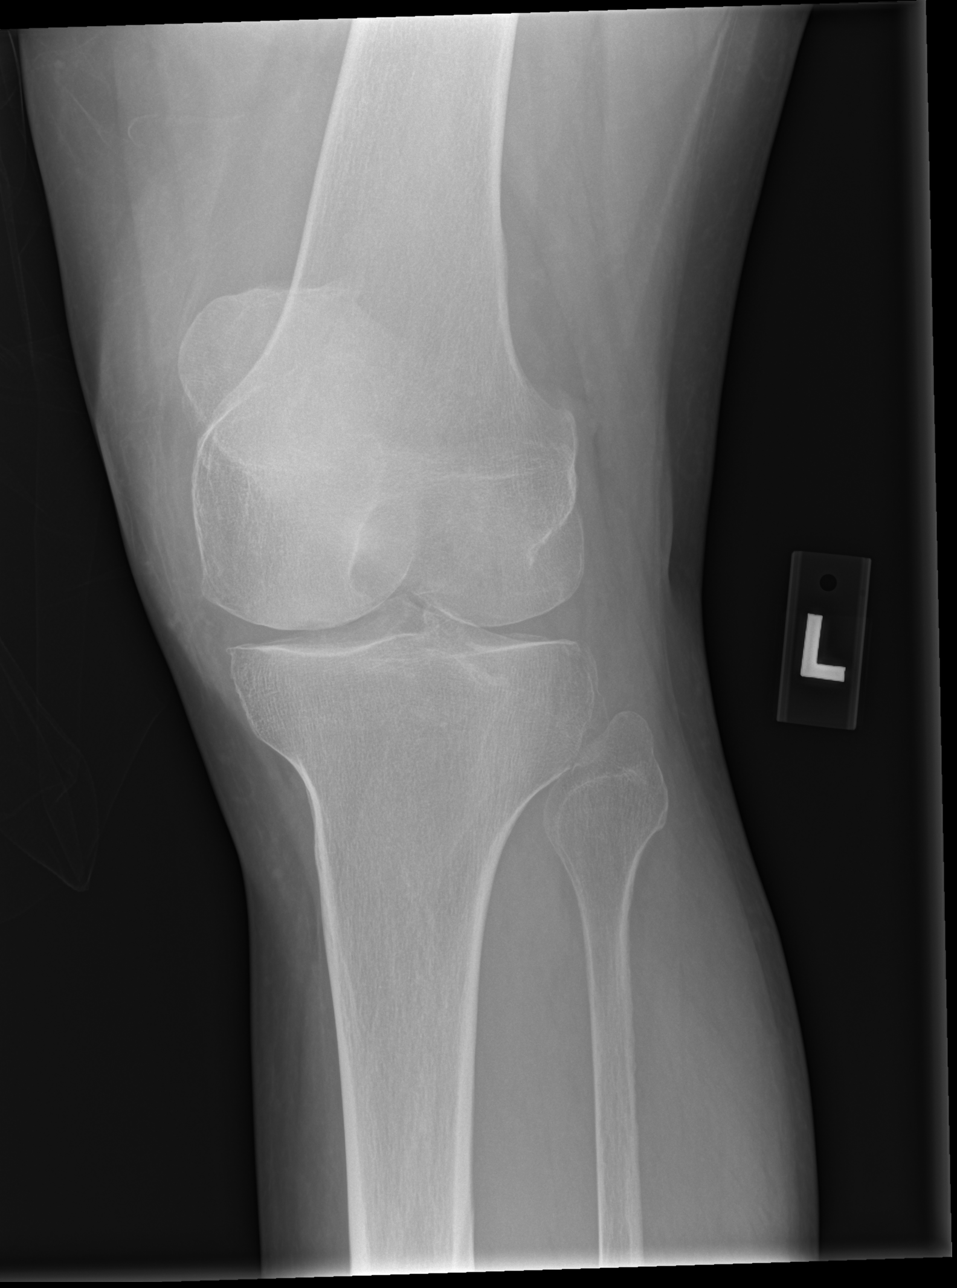

[x knee obl left (2 of 2)]
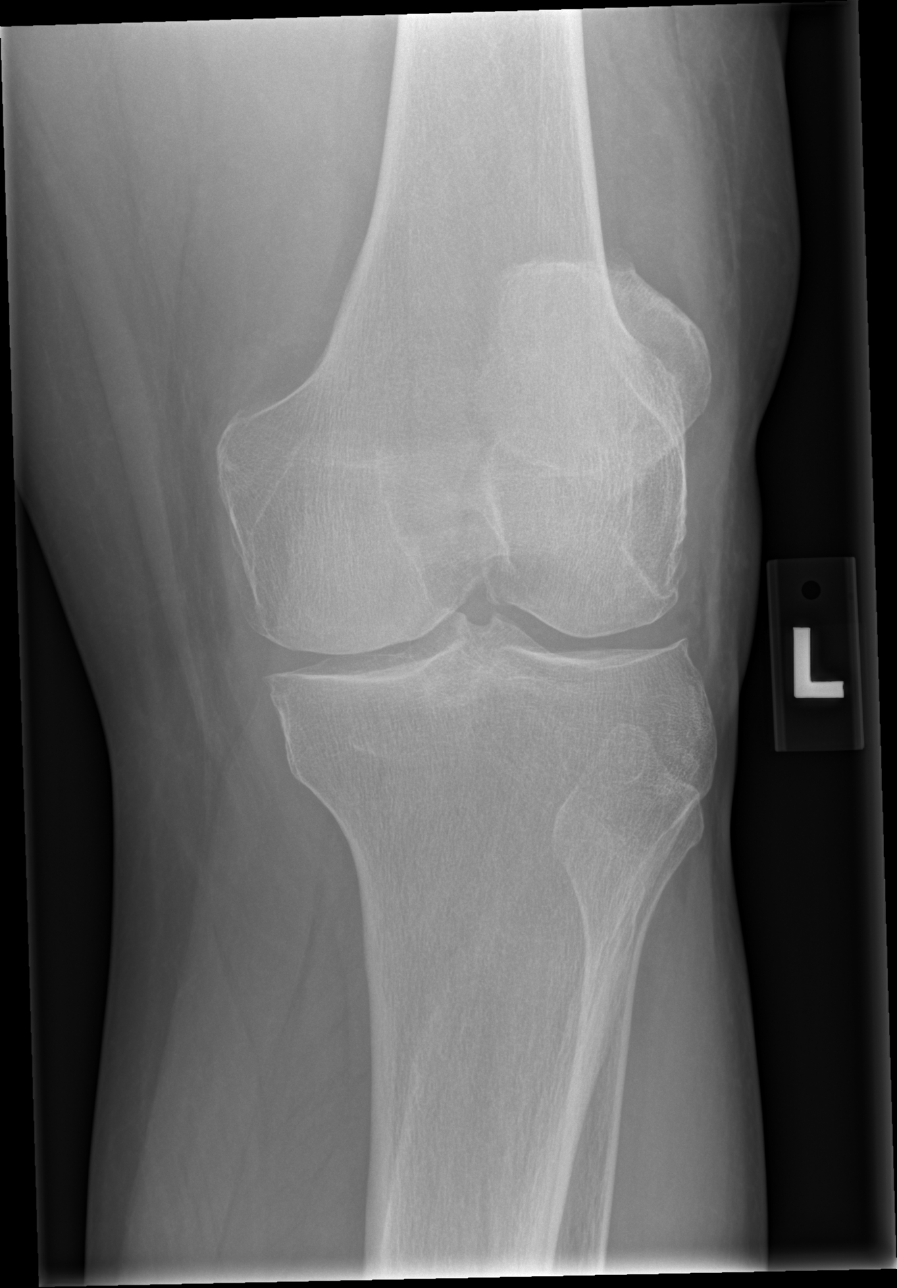

[x knee lat left]
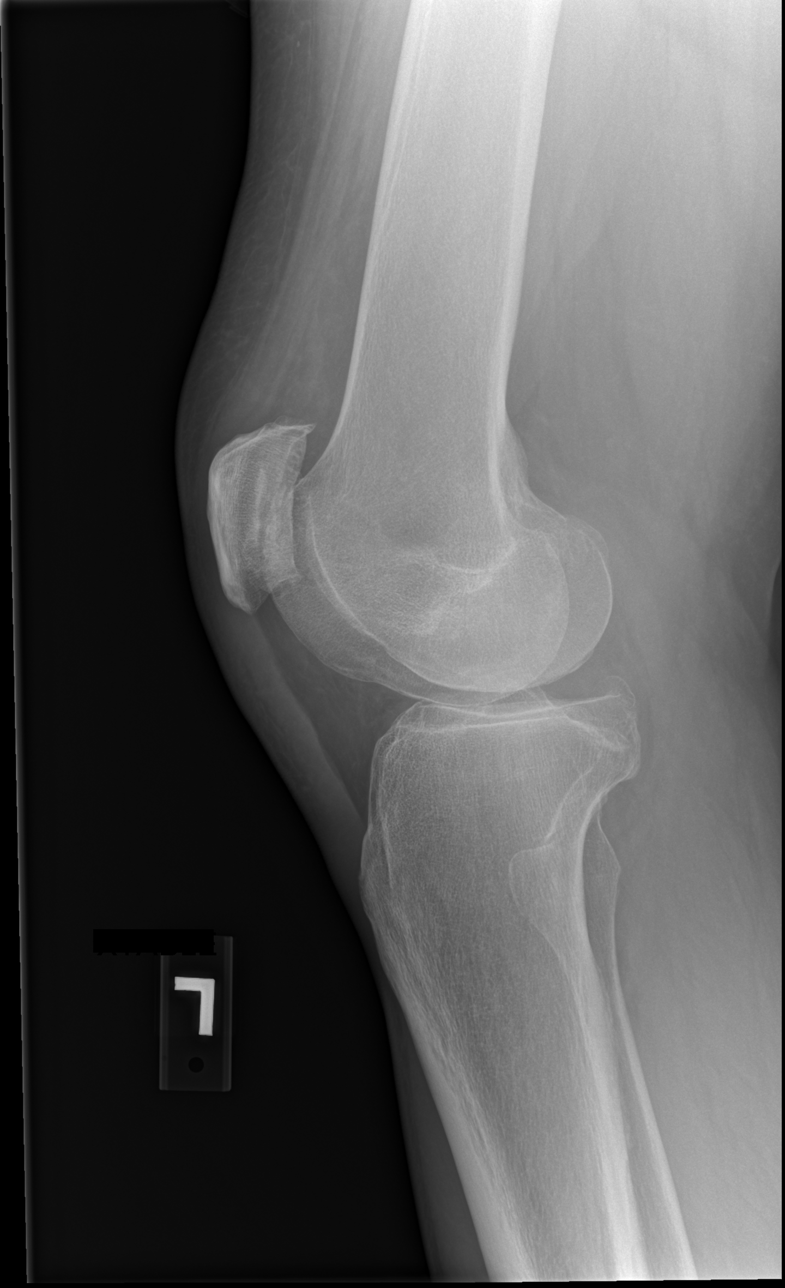

[4 of 4 positions shown; findings below may reference images not displayed]

FINDINGS: No evidence of fracture, dislocation, or joint effusion. No evidence
of arthropathy or other focal bone abnormality. Soft tissues are
unremarkable.
IMPRESSION: Negative.

## 2020-09-16 ENCOUNTER — Ambulatory Visit (INDEPENDENT_AMBULATORY_CARE_PROVIDER_SITE_OTHER): Payer: PPO | Admitting: Family Medicine

## 2020-09-16 ENCOUNTER — Other Ambulatory Visit: Payer: Self-pay

## 2020-09-16 ENCOUNTER — Encounter (INDEPENDENT_AMBULATORY_CARE_PROVIDER_SITE_OTHER): Payer: Self-pay | Admitting: Family Medicine

## 2020-09-16 VITALS — BP 115/76 | HR 61 | Temp 97.5°F | Ht 69.0 in | Wt 266.0 lb

## 2020-09-16 DIAGNOSIS — Z0289 Encounter for other administrative examinations: Secondary | ICD-10-CM

## 2020-09-16 DIAGNOSIS — I4819 Other persistent atrial fibrillation: Secondary | ICD-10-CM

## 2020-09-16 DIAGNOSIS — E559 Vitamin D deficiency, unspecified: Secondary | ICD-10-CM

## 2020-09-16 DIAGNOSIS — G4733 Obstructive sleep apnea (adult) (pediatric): Secondary | ICD-10-CM | POA: Diagnosis not present

## 2020-09-16 DIAGNOSIS — Z1331 Encounter for screening for depression: Secondary | ICD-10-CM | POA: Diagnosis not present

## 2020-09-16 DIAGNOSIS — R0602 Shortness of breath: Secondary | ICD-10-CM | POA: Diagnosis not present

## 2020-09-16 DIAGNOSIS — Z6839 Body mass index (BMI) 39.0-39.9, adult: Secondary | ICD-10-CM

## 2020-09-16 DIAGNOSIS — R5383 Other fatigue: Secondary | ICD-10-CM

## 2020-09-16 DIAGNOSIS — R739 Hyperglycemia, unspecified: Secondary | ICD-10-CM | POA: Diagnosis not present

## 2020-09-16 DIAGNOSIS — E782 Mixed hyperlipidemia: Secondary | ICD-10-CM

## 2020-09-17 LAB — INSULIN, RANDOM: INSULIN: 15.3 u[IU]/mL (ref 2.6–24.9)

## 2020-09-17 LAB — VITAMIN D 25 HYDROXY (VIT D DEFICIENCY, FRACTURES): Vit D, 25-Hydroxy: 13.9 ng/mL — ABNORMAL LOW (ref 30.0–100.0)

## 2020-09-17 LAB — CBC WITH DIFFERENTIAL/PLATELET
Basophils Absolute: 0 10*3/uL (ref 0.0–0.2)
Basos: 0 %
EOS (ABSOLUTE): 0.2 10*3/uL (ref 0.0–0.4)
Eos: 4 %
Hematocrit: 43.9 % (ref 37.5–51.0)
Hemoglobin: 15.3 g/dL (ref 13.0–17.7)
Immature Grans (Abs): 0 10*3/uL (ref 0.0–0.1)
Immature Granulocytes: 0 %
Lymphocytes Absolute: 1.9 10*3/uL (ref 0.7–3.1)
Lymphs: 30 %
MCH: 32.8 pg (ref 26.6–33.0)
MCHC: 34.9 g/dL (ref 31.5–35.7)
MCV: 94 fL (ref 79–97)
Monocytes Absolute: 0.5 10*3/uL (ref 0.1–0.9)
Monocytes: 8 %
Neutrophils Absolute: 3.6 10*3/uL (ref 1.4–7.0)
Neutrophils: 58 %
Platelets: 222 10*3/uL (ref 150–450)
RBC: 4.66 x10E6/uL (ref 4.14–5.80)
RDW: 13.6 % (ref 11.6–15.4)
WBC: 6.3 10*3/uL (ref 3.4–10.8)

## 2020-09-17 LAB — LIPID PANEL WITH LDL/HDL RATIO
Cholesterol, Total: 208 mg/dL — ABNORMAL HIGH (ref 100–199)
HDL: 59 mg/dL (ref >39)
LDL Chol Calc (NIH): 133 mg/dL — ABNORMAL HIGH (ref 0–99)
LDL/HDL Ratio: 2.3 ratio (ref 0.0–3.6)
Triglycerides: 89 mg/dL (ref 0–149)
VLDL Cholesterol Cal: 16 mg/dL (ref 5–40)

## 2020-09-17 LAB — COMPREHENSIVE METABOLIC PANEL
ALT: 16 IU/L (ref 0–44)
AST: 20 IU/L (ref 0–40)
Albumin/Globulin Ratio: 1.5 (ref 1.2–2.2)
Albumin: 4.1 g/dL (ref 3.8–4.8)
Alkaline Phosphatase: 63 IU/L (ref 44–121)
BUN/Creatinine Ratio: 10 (ref 10–24)
BUN: 10 mg/dL (ref 8–27)
Bilirubin Total: 1.4 mg/dL — ABNORMAL HIGH (ref 0.0–1.2)
CO2: 23 mmol/L (ref 20–29)
Calcium: 9.6 mg/dL (ref 8.6–10.2)
Chloride: 103 mmol/L (ref 96–106)
Creatinine, Ser: 0.99 mg/dL (ref 0.76–1.27)
Globulin, Total: 2.8 g/dL (ref 1.5–4.5)
Glucose: 90 mg/dL (ref 65–99)
Potassium: 4.8 mmol/L (ref 3.5–5.2)
Sodium: 142 mmol/L (ref 134–144)
Total Protein: 6.9 g/dL (ref 6.0–8.5)
eGFR: 82 mL/min/{1.73_m2} (ref >59)

## 2020-09-17 LAB — HEMOGLOBIN A1C
Est. average glucose Bld gHb Est-mCnc: 126 mg/dL
Hgb A1c MFr Bld: 6 % — ABNORMAL HIGH (ref 4.8–5.6)

## 2020-09-17 LAB — T3: T3, Total: 121 ng/dL (ref 71–180)

## 2020-09-17 LAB — TSH: TSH: 1.4 u[IU]/mL (ref 0.450–4.500)

## 2020-09-17 LAB — T4: T4, Total: 7.3 ug/dL (ref 4.5–12.0)

## 2020-09-21 NOTE — Progress Notes (Signed)
Dear Phillip Shannon,   Thank you for referring Phillip Shannon to our clinic. The following note includes my evaluation and treatment recommendations.  Chief Complaint:   OBESITY Phillip Shannon (MR# [id number]) is a 70 y.o. male who presents for evaluation and treatment of obesity and related comorbidities. Current BMI is Body mass index is 39.28 kg/Phillip Shannon. Phillip Shannon has been struggling with his weight for many years and has been unsuccessful in either losing weight, maintaining weight loss, or reaching his healthy weight goal.  Phillip Shannon is currently in the action stage of change and ready to dedicate time achieving and maintaining a healthier weight. Phillip Shannon is interested in becoming our patient and working on intensive lifestyle modifications including (but not limited to) diet and exercise for weight loss.  Phillip Shannon was referred by Phillip Shannon. Previously athlete in high school but knee pain keeps Phillip Shannon from participating in much physical activity now. Breakfast- bowl of grits or cereal (raisin bran 2 cups) (feel satisfied); 10-10:30 AM breakfast again (sometimes hungry sometimes not); 1-1:15 PM sandwich with ham/turkey (3 slices total) with cheese and chips and soda (cheerwine) (feel satisfied); Dinner- 7-9 PM pork chop 1 thick, fried chicken, greens beans 1.58 cups, corn 1.5 cups; After dinner- 2 times week dessert.  Phillip Shannon's habits were reviewed today and are as follows: His family eats meals together, he thinks his family will eat healthier with him, his desired weight loss is 76 lbs, he started gaining weight when knee swelling kept him from sports, his heaviest weight ever was 266 pounds, he has significant food cravings issues, he snacks frequently in the evenings, he is frequently drinking liquids with calories, he frequently makes poor food choices, he frequently eats larger portions than normal and he struggles with emotional eating.  Depression Screen Phillip Shannon's Food and Mood (modified PHQ-9)  score was 11.  Depression screen [facility] 2/9 09/16/2020  Decreased Interest 3  Down, Depressed, Hopeless 0  PHQ - 2 Score 3  Altered sleeping 2  Tired, decreased energy 3  Change in appetite 0  Feeling bad or failure about yourself  1  Trouble concentrating 1  Moving slowly or fidgety/restless 1  Suicidal thoughts 0  PHQ-9 Score 11  Difficult doing work/chores Not difficult at all   Subjective:   1. Other fatigue Phillip Shannon admits to daytime somnolence and admits to waking up still tired. Patent has a history of symptoms of daytime fatigue and morning headache. Phillip Shannon generally gets 4 or 5 hours of sleep per night, and states that he has poor sleep quality. Snoring is present. Apneic episodes are present. Epworth Sleepiness Score is 16. EKG- A-fib (unchanged from Dec 2021).  2. SOB (shortness of breath) on exertion Phillip Shannon notes increasing shortness of breath with exercising and seems to be worsening over time with weight gain. He notes getting out of breath sooner with activity than he used to. This has gotten worse recently. Phillip Shannon denies shortness of breath at rest or orthopnea. EKG- A-fib (unchanged from Dec 2021).  3. Atrial fibrillation, persistent (HCC) Phillip Shannon is on a beta blocker and aspirin. He sees Phillip Shannon.  4. Mixed hyperlipidemia Phillip Shannon reports borderline cholesterol. He is on atorvastatin. He denies myalgias.   5. Hyperglycemia Phillip Shannon has an elevated blood sugar in the past. He has no history of diabetes.  6. OSA (obstructive sleep apnea) Phillip Shannon has a CPAP but doesn't wear it as compliantly as he should.   7. Vitamin D deficiency Phillip Shannon is not on supplementation. He reports  fatigue. Deficiency likely given obesity.  Assessment/Plan:   1. Other fatigue Phillip Shannon does feel that his weight is causing his energy to be lower than it should be. Fatigue may be related to obesity, depression or many other causes. Labs will be ordered, and in the meanwhile, Phillip Shannon will focus on self care including  making healthy food choices, increasing physical activity and focusing on stress reduction. - EKG 12-Lead - CBC with Differential/Platelet - T3 - T4 - TSH  2. SOB (shortness of breath) on exertion Phillip Shannon does feel that he gets out of breath more easily that he used to when he exercises. Phillip Shannon's shortness of breath appears to be obesity related and exercise induced. He has agreed to work on weight loss and gradually increase exercise to treat his exercise induced shortness of breath. Will continue to monitor closely.  3. Atrial fibrillation, persistent (HCC) Follow up with Phillip Shannon.  4. Mixed hyperlipidemia Cardiovascular risk and specific lipid/LDL goals reviewed.  We discussed several lifestyle modifications today and Phillip Shannon will continue to work on diet, exercise and weight loss efforts. Orders and follow up as documented in patient record. Check labs today.  Counseling Intensive lifestyle modifications are the first line treatment for this issue. . Dietary changes: Increase soluble fiber. Decrease simple carbohydrates. . Exercise changes: Moderate to vigorous-intensity aerobic activity 150 minutes per week if tolerated. . Lipid-lowering medications: see documented in medical record.  - Lipid Panel With LDL/HDL Ratio  5. Hyperglycemia Fasting labs will be obtained and results with be discussed with Phillip Shannon in 2 weeks at his follow up visit. In the meanwhile Phillip Shannon was started on a lower simple carbohydrate diet and will work on weight loss efforts. Check labs today.  - Comprehensive metabolic panel - Hemoglobin A1c - Insulin, random  6. OSA (obstructive sleep apnea) Intensive lifestyle modifications are the first line treatment for this issue. We discussed several lifestyle modifications today and he will continue to work on diet, exercise and weight loss efforts. We will continue to monitor. Orders and follow up as documented in patient record. Follow up at next appointment.  Increased CPAP compliance encouraged.  7. Vitamin D deficiency Low Vitamin D level contributes to fatigue and are associated with obesity, breast, and colon cancer. He agrees to continue to for routine testing of Vitamin D, at least 2-3 times per year to avoid over-replacement. Check labs today.  - VITAMIN D 25 Hydroxy (Vit-D Deficiency, Fractures)  8. Depression screening Phillip Shannon had a positive depression screening. Depression is commonly associated with obesity and often results in emotional eating behaviors. We will monitor this closely and work on CBT to help improve the non-hunger eating patterns. Referral to Psychology may be required if no improvement is seen as he continues in our clinic.  9. Class 2 severe obesity with serious comorbidity and body mass index (BMI) of 39.0 to 39.9 in adult, unspecified obesity type (HCC) Phillip Shannon is currently in the action stage of change and his goal is to continue with weight loss efforts. I recommend Phillip Shannon begin the structured treatment plan as follows:  He has agreed to the Category 3 Plan.  Exercise goals: No exercise has been prescribed at this time.   Behavioral modification strategies: increasing lean protein intake, meal planning and cooking strategies, keeping healthy foods in the home and planning for success.  He was informed of the importance of frequent follow-up visits to maximize his success with intensive lifestyle modifications for his multiple health conditions. He was informed we would  discuss his lab results at his next visit unless there is a critical issue that needs to be addressed sooner. Damean agreed to keep his next visit at the agreed upon time to discuss these results.  Objective:   Blood pressure 115/76, pulse 61, temperature (!) 97.5 F (36.4 C), temperature source Oral, height 5\' 9"  (1.753 Phillip Shannon), weight 266 lb (120.7 kg), SpO2 98 %. Body mass index is 39.28 kg/Phillip Shannon.  EKG: Normal sinus rhythm, rate 81.  Indirect  Calorimeter completed today shows a VO2 of 293 and a REE of 2039.  His calculated basal metabolic rate is 2040 thus his basal metabolic rate is worse than expected.  General: Cooperative, alert, well developed, in no acute distress. HEENT: Conjunctivae and lids unremarkable. Cardiovascular: Regular rhythm.  Lungs: Normal work of breathing. Neurologic: No focal deficits.   Lab Results  Component Value Date   CREATININE 0.99 09/16/2020   BUN 10 09/16/2020   NA 142 09/16/2020   K 4.8 09/16/2020   CL 103 09/16/2020   CO2 23 09/16/2020   Lab Results  Component Value Date   ALT 16 09/16/2020   AST 20 09/16/2020   ALKPHOS 63 09/16/2020   BILITOT 1.4 (H) 09/16/2020   Lab Results  Component Value Date   HGBA1C 6.0 (H) 09/16/2020   Lab Results  Component Value Date   INSULIN 15.3 09/16/2020   Lab Results  Component Value Date   TSH 1.400 09/16/2020   Lab Results  Component Value Date   CHOL 208 (H) 09/16/2020   HDL 59 09/16/2020   LDLCALC 133 (H) 09/16/2020   TRIG 89 09/16/2020   Lab Results  Component Value Date   WBC 6.3 09/16/2020   HGB 15.3 09/16/2020   HCT 43.9 09/16/2020   MCV 94 09/16/2020   PLT 222 09/16/2020   No results found for: IRON, TIBC, FERRITIN Obesity Behavioral Intervention:   Approximately 15 minutes were spent on the discussion below.  ASK: We discussed the diagnosis of obesity with 11/16/2020 today and Adriell agreed to give Phillip Shannon permission to discuss obesity behavioral modification therapy today.  ASSESS: Maveryk has the diagnosis of obesity and his BMI today is 39.3. Christoper is in the action stage of change.   ADVISE: Clyde was educated on the multiple health risks of obesity as well as the benefit of weight loss to improve his health. He was advised of the need for long term treatment and the importance of lifestyle modifications to improve his current health and to decrease his risk of future health problems.  AGREE: Multiple dietary  modification options and treatment options were discussed and Ahmari agreed to follow the recommendations documented in the above note.  ARRANGE: Jayse was educated on the importance of frequent visits to treat obesity as outlined per CMS and USPSTF guidelines and agreed to schedule his next follow up appointment today.  Attestation Statements:   Reviewed by clinician on day of visit: allergies, medications, problem list, medical history, surgical history, family history, social history, and previous encounter notes.  This is the patient's first visit at [facility]. The patient's NEW PATIENT PACKET was reviewed at length. Included in the packet: current and past health history, medications, allergies, ROS, gynecologic history (women only), surgical history, family history, social history, weight history, weight loss surgery history (for those that have had weight loss surgery), nutritional evaluation, mood and food questionnaire, PHQ9, Epworth questionnaire, sleep habits questionnaire, patient life and health improvement goals questionnaire. These will all be scanned  into the patient's chart under media.   During the visit, I independently reviewed the patient's EKG, bioimpedance scale results, and indirect calorimeter results. I used this information to tailor a meal plan for the patient that will help him to lose weight and will improve his obesity-related conditions going forward. I performed a medically necessary appropriate examination and/or evaluation. I discussed the assessment and treatment plan with the patient. The patient was provided an opportunity to ask questions and all were answered. The patient agreed with the plan and demonstrated an understanding of the instructions. Labs were ordered at this visit and will be reviewed at the next visit unless more critical results need to be addressed immediately. Clinical information was updated and documented in the EMR.   Time  spent on visit including pre-visit chart review and post-visit care was 45 minutes.   A separate 15 minutes was spent on risk counseling (see above).    Edmund HildaI, Tamesha Frazier, am acting as transcriptionist for Reuben LikesAlexandria Ukleja, MD.   I have reviewed the above documentation for accuracy and completeness, and I agree with the above. - Katherina MiresAlexandria Ukleja Kadolph, MD

## 2020-09-30 ENCOUNTER — Ambulatory Visit (INDEPENDENT_AMBULATORY_CARE_PROVIDER_SITE_OTHER): Payer: PPO | Admitting: Family Medicine

## 2020-09-30 ENCOUNTER — Encounter (INDEPENDENT_AMBULATORY_CARE_PROVIDER_SITE_OTHER): Payer: Self-pay | Admitting: Family Medicine

## 2020-09-30 ENCOUNTER — Other Ambulatory Visit: Payer: Self-pay

## 2020-09-30 ENCOUNTER — Other Ambulatory Visit (INDEPENDENT_AMBULATORY_CARE_PROVIDER_SITE_OTHER): Payer: Self-pay | Admitting: Family Medicine

## 2020-09-30 VITALS — BP 127/84 | HR 82 | Temp 98.1°F | Ht 69.0 in | Wt 261.0 lb

## 2020-09-30 DIAGNOSIS — Z6839 Body mass index (BMI) 39.0-39.9, adult: Secondary | ICD-10-CM

## 2020-09-30 DIAGNOSIS — E782 Mixed hyperlipidemia: Secondary | ICD-10-CM

## 2020-09-30 DIAGNOSIS — E559 Vitamin D deficiency, unspecified: Secondary | ICD-10-CM

## 2020-09-30 DIAGNOSIS — R7303 Prediabetes: Secondary | ICD-10-CM | POA: Diagnosis not present

## 2020-09-30 MED ORDER — VITAMIN D (ERGOCALCIFEROL) 1.25 MG (50000 UNIT) PO CAPS: 50000.0000 [IU] | ORAL_CAPSULE | ORAL | 0 refills | Status: DC

## 2020-10-05 NOTE — Progress Notes (Signed)
Chief Complaint:   OBESITY Phillip Shannon is here to discuss his progress with his obesity treatment plan along with follow-up of his obesity related diagnoses. Draylon is on the Category 3 Plan and states he is following his eating plan approximately 30% of the time. [name] states he is walking 1 mile 3-4 times per week.  Today's visit was #: 2 Starting weight: 266 lbs Starting date: 09/16/2020 Today's weight: 261 lbs Today's date: 09/30/2020 Total lbs lost to date: 5 lbs Total lbs lost since last in-office visit: 5 lbs  Interim History: Phillip Shannon and his wife are doing the meal plan together. His wife had a hard time believing he wasn't eating enough. He is getting up in the morning and eating before work- slice of bread, +/- cereal. He is doing orange juice and water in the morning as well. For lunch, he is doing sandwich with 2 slices [place], 2 slices roast beef, occasionally with cheese. His snacks sometimes include peanuts. Dinner is [facility].  Subjective:   1. Mixed hyperlipidemia Discussed labs with patient today. Phillip Shannon's LDL is 133, HDL 59, and triglycerides 89. He is on Lipitor 40 mg daily. He denies transaminitis.   Lab Results  Component Value Date   ALT 16 09/16/2020   AST 20 09/16/2020   ALKPHOS 63 09/16/2020   BILITOT 1.4 (H) 09/16/2020   Lab Results  Component Value Date   CHOL 208 (H) 09/16/2020   HDL 59 09/16/2020   LDLCALC 133 (H) 09/16/2020   TRIG 89 09/16/2020    2. Pre-diabetes New. Discussed labs with patient today. Musab's A1c is 6.0 with an insulin level 15.3. He is not on medication. He reports carb cravings for increased sugar fruit like oranges, bananas, and fruit cocktail.  Lab Results  Component Value Date   HGBA1C 6.0 (H) 09/16/2020   Lab Results  Component Value Date   INSULIN 15.3 09/16/2020    3. Vitamin D deficiency New. Discussed labs with patient today. Phillip Shannon's Vit D level is 13.9. He is not on Vit D supplement. He reports  fatigue.  Assessment/Plan:   1. Mixed hyperlipidemia Cardiovascular risk and specific lipid/LDL goals reviewed.  We discussed several lifestyle modifications today and Mcclain will continue to work on diet, exercise and weight loss efforts. Orders and follow up as documented in patient record.   Counseling Intensive lifestyle modifications are the first line treatment for this issue. . Dietary changes: Increase soluble fiber. Decrease simple carbohydrates. . Exercise changes: Moderate to vigorous-intensity aerobic activity 150 minutes per week if tolerated. . Lipid-lowering medications: see documented in medical record.  2. Pre-diabetes [name] will continue to work on weight loss, exercise, and decreasing simple carbohydrates to help decrease the risk of diabetes.   3. Vitamin D deficiency Low Vitamin D level contributes to fatigue and are associated with obesity, breast, and colon cancer. He agrees to start to take prescription Vitamin D @50 ,000 IU every week and will follow-up for routine testing of Vitamin D, at least 2-3 times per year to avoid over-replacement.  - Vitamin D, Ergocalciferol, (DRISDOL) 1.25 MG (50000 UNIT) CAPS capsule; Take 1 capsule (50,000 Units total) by mouth every 7 (seven) days.  Dispense: 4 capsule; Refill: 0  4. Class 2 severe obesity with serious comorbidity and body mass index (BMI) of 39.0 to 39.9 in adult, unspecified obesity type [facility]) Phillip Shannon is currently in the action stage of change. As such, his goal is to continue with weight loss efforts. He has agreed to  the Category 3 Plan with Lean Cuisine at dinner.   Exercise goals: No exercise has been prescribed at this time.  Behavioral modification strategies: increasing lean protein intake, meal planning and cooking strategies, keeping healthy foods in the home and planning for success.  Phillip Shannon has agreed to follow-up with our clinic in 2 weeks. He was informed of the importance of frequent follow-up visits  to maximize his success with intensive lifestyle modifications for his multiple health conditions.   Objective:   Blood pressure 127/84, pulse 82, temperature 98.1 F (36.7 C), temperature source Oral, height 5\' 9"  (1.753 m), weight 261 lb (118.4 kg), SpO2 98 %. Body mass index is 38.54 kg/m.  General: Cooperative, alert, well developed, in no acute distress. HEENT: Conjunctivae and lids unremarkable. Cardiovascular: Regular rhythm.  Lungs: Normal work of breathing. Neurologic: No focal deficits.   Lab Results  Component Value Date   CREATININE 0.99 09/16/2020   BUN 10 09/16/2020   NA 142 09/16/2020   K 4.8 09/16/2020   CL 103 09/16/2020   CO2 23 09/16/2020   Lab Results  Component Value Date   ALT 16 09/16/2020   AST 20 09/16/2020   ALKPHOS 63 09/16/2020   BILITOT 1.4 (H) 09/16/2020   Lab Results  Component Value Date   HGBA1C 6.0 (H) 09/16/2020   Lab Results  Component Value Date   INSULIN 15.3 09/16/2020   Lab Results  Component Value Date   TSH 1.400 09/16/2020   Lab Results  Component Value Date   CHOL 208 (H) 09/16/2020   HDL 59 09/16/2020   LDLCALC 133 (H) 09/16/2020   TRIG 89 09/16/2020   Lab Results  Component Value Date   WBC 6.3 09/16/2020   HGB 15.3 09/16/2020   HCT 43.9 09/16/2020   MCV 94 09/16/2020   PLT 222 09/16/2020   No results found for: IRON, TIBC, FERRITIN  Obesity Behavioral Intervention:   Approximately 15 minutes were spent on the discussion below.  ASK: We discussed the diagnosis of obesity with 11/16/2020 today and Phillip Shannon agreed to give Phillip Shannon permission to discuss obesity behavioral modification therapy today.  ASSESS: Phillip Shannon has the diagnosis of obesity and his BMI today is 38.5. Shah is in the action stage of change.   ADVISE: Phillip Shannon was educated on the multiple health risks of obesity as well as the benefit of weight loss to improve his health. He was advised of the need for long term treatment and the importance of  lifestyle modifications to improve his current health and to decrease his risk of future health problems.  AGREE: Multiple dietary modification options and treatment options were discussed and Phillip Shannon agreed to follow the recommendations documented in the above note.  ARRANGE: Tyqwan was educated on the importance of frequent visits to treat obesity as outlined per CMS and USPSTF guidelines and agreed to schedule his next follow up appointment today.  Attestation Statements:   Reviewed by clinician on day of visit: allergies, medications, problem list, medical history, surgical history, family history, social history, and previous encounter notes.  Phillip Shannon, am acting as transcriptionist for Edmund Hilda, MD.   I have reviewed the above documentation for accuracy and completeness, and I agree with the above. - Reuben Likes, MD

## 2020-10-15 ENCOUNTER — Other Ambulatory Visit: Payer: Self-pay

## 2020-10-15 ENCOUNTER — Ambulatory Visit (INDEPENDENT_AMBULATORY_CARE_PROVIDER_SITE_OTHER): Payer: PPO | Admitting: Family Medicine

## 2020-10-15 ENCOUNTER — Encounter (INDEPENDENT_AMBULATORY_CARE_PROVIDER_SITE_OTHER): Payer: Self-pay | Admitting: Family Medicine

## 2020-10-15 VITALS — BP 109/76 | HR 71 | Temp 98.0°F | Ht 69.0 in | Wt 259.0 lb

## 2020-10-15 DIAGNOSIS — Z6839 Body mass index (BMI) 39.0-39.9, adult: Secondary | ICD-10-CM | POA: Diagnosis not present

## 2020-10-15 DIAGNOSIS — R7303 Prediabetes: Secondary | ICD-10-CM | POA: Diagnosis not present

## 2020-10-15 DIAGNOSIS — E559 Vitamin D deficiency, unspecified: Secondary | ICD-10-CM | POA: Diagnosis not present

## 2020-10-22 NOTE — Progress Notes (Signed)
Chief Complaint:   OBESITY Phillip Shannon is here to discuss his progress with his obesity treatment plan along with follow-up of his obesity related diagnoses. Phillip Shannon is on the Category 3 Plan and states he is following his eating plan approximately 60% of the time. Phillip Shannon states he is not currently exercising.  Today's visit was #: 3 Starting weight: 266 lbs Starting date: 09/16/2020 Today's weight: 259 lbs Today's date: 10/15/2020 Total lbs lost to date: 7  Total lbs lost since last in-office visit: 2  Interim History: Phillip Shannon has been in his normal routine and trying to stick to meal plan as much as he can. Breakfast is boiled eggs, Special K with blueberries, toast; Lunch- sandwich  (not putting full 4 oz on sandwich. 2 slices normally) with vegetable tray, occasionally doing tuna in pouch; Dinner is [facility] (did 2 one time but now eating only one) or vegetables and pork chops.  Subjective:   1. Vitamin D deficiency Phillip Shannon denies nausea, vomiting, and muscle weakness but notes fatigue. Pt is on prescription Vit D.  2. Pre-diabetes Phillip Shannon's last A1c was 6.0 and insulin level 15.3. He is not on medication.  Lab Results  Component Value Date   HGBA1C 6.0 (H) 09/16/2020   Lab Results  Component Value Date   INSULIN 15.3 09/16/2020    Assessment/Plan:   1. Vitamin D deficiency Low Vitamin D level contributes to fatigue and are associated with obesity, breast, and colon cancer. He agrees to continue to take prescription Vitamin D @50 ,000 IU every week and will follow-up for routine testing of Vitamin D, at least 2-3 times per year to avoid over-replacement.  2. Pre-diabetes Phillip Shannon will continue to work on weight loss, exercise, and decreasing simple carbohydrates to help decrease the risk of diabetes. Repeat labs in early July. No meds at this time.  3. Class 2 severe obesity with serious comorbidity and body mass index (BMI) of 39.0 to 39.9 in adult, unspecified obesity type  (HCC) Phillip Shannon is currently in the action stage of change. As such, his goal is to continue with weight loss efforts. He has agreed to the Category 3 Plan.   Exercise goals:  As is  Behavioral modification strategies: increasing lean protein intake, meal planning and cooking strategies and keeping healthy foods in the home.  Phillip Shannon has agreed to follow-up with our clinic in 3 weeks. He was informed of the importance of frequent follow-up visits to maximize his success with intensive lifestyle modifications for his multiple health conditions.   Objective:   Blood pressure 109/76, pulse 71, temperature 98 F (36.7 C), height 5\' 9"  (1.753 m), weight 259 lb (117.5 kg), SpO2 97 %. Body mass index is 38.25 kg/m.  General: Cooperative, alert, well developed, in no acute distress. HEENT: Conjunctivae and lids unremarkable. Cardiovascular: Regular rhythm.  Lungs: Normal work of breathing. Neurologic: No focal deficits.   Lab Results  Component Value Date   CREATININE 0.99 09/16/2020   BUN 10 09/16/2020   NA 142 09/16/2020   K 4.8 09/16/2020   CL 103 09/16/2020   CO2 23 09/16/2020   Lab Results  Component Value Date   ALT 16 09/16/2020   AST 20 09/16/2020   ALKPHOS 63 09/16/2020   BILITOT 1.4 (H) 09/16/2020   Lab Results  Component Value Date   HGBA1C 6.0 (H) 09/16/2020   Lab Results  Component Value Date   INSULIN 15.3 09/16/2020   Lab Results  Component Value Date   TSH 1.400  09/16/2020   Lab Results  Component Value Date   CHOL 208 (H) 09/16/2020   HDL 59 09/16/2020   LDLCALC 133 (H) 09/16/2020   TRIG 89 09/16/2020   Lab Results  Component Value Date   WBC 6.3 09/16/2020   HGB 15.3 09/16/2020   HCT 43.9 09/16/2020   MCV 94 09/16/2020   PLT 222 09/16/2020     Attestation Statements:   Reviewed by clinician on day of visit: allergies, medications, problem list, medical history, surgical history, family history, social history, and previous encounter  notes.  Time spent on visit including pre-visit chart review and post-visit care and charting was 15 minutes.   Edmund Hilda, am acting as transcriptionist for Reuben Likes, MD.   I have reviewed the above documentation for accuracy and completeness, and I agree with the above. - Katherina Mires, MD

## 2020-10-26 ENCOUNTER — Other Ambulatory Visit (HOSPITAL_COMMUNITY): Payer: Self-pay

## 2020-10-26 DIAGNOSIS — M25562 Pain in left knee: Secondary | ICD-10-CM | POA: Diagnosis not present

## 2020-10-26 MED ORDER — DICLOFENAC SODIUM 75 MG PO TBEC
75.0000 mg | DELAYED_RELEASE_TABLET | Freq: Two times a day (BID) | ORAL | 0 refills | Status: DC
  Filled 2020-10-26: qty 60, 30d supply, fill #0

## 2020-11-04 ENCOUNTER — Other Ambulatory Visit (HOSPITAL_COMMUNITY): Payer: Self-pay

## 2020-11-05 ENCOUNTER — Other Ambulatory Visit: Payer: Self-pay

## 2020-11-05 ENCOUNTER — Encounter (INDEPENDENT_AMBULATORY_CARE_PROVIDER_SITE_OTHER): Payer: Self-pay | Admitting: Physician Assistant

## 2020-11-05 ENCOUNTER — Telehealth (INDEPENDENT_AMBULATORY_CARE_PROVIDER_SITE_OTHER): Payer: PPO | Admitting: Physician Assistant

## 2020-11-05 ENCOUNTER — Other Ambulatory Visit (HOSPITAL_COMMUNITY): Payer: Self-pay

## 2020-11-05 DIAGNOSIS — E559 Vitamin D deficiency, unspecified: Secondary | ICD-10-CM | POA: Diagnosis not present

## 2020-11-05 DIAGNOSIS — Z6839 Body mass index (BMI) 39.0-39.9, adult: Secondary | ICD-10-CM | POA: Diagnosis not present

## 2020-11-05 MED ORDER — VITAMIN D (ERGOCALCIFEROL) 1.25 MG (50000 UNIT) PO CAPS
50000.0000 [IU] | ORAL_CAPSULE | ORAL | 0 refills | Status: DC
  Filled 2020-11-05: qty 4, 28d supply, fill #0

## 2020-11-05 MED ORDER — SILDENAFIL CITRATE 100 MG PO TABS
ORAL_TABLET | ORAL | 0 refills | Status: DC
  Filled 2020-11-05: qty 6, 30d supply, fill #0

## 2020-11-09 NOTE — Progress Notes (Signed)
TeleHealth Visit:  Due to the COVID-19 pandemic, this visit was completed with telemedicine (audio/video) technology to reduce patient and provider exposure as well as to preserve personal protective equipment.   Phillip Shannon has verbally consented to this TeleHealth visit. The patient is located at home, the provider is located at the [facility] office. The participants in this visit include the listed provider and patient. The visit was conducted today via video.    Chief Complaint: OBESITY Phillip Shannon is here to discuss his progress with his obesity treatment plan along with follow-up of his obesity related diagnoses. [name] is on the Category 3 Plan and states he is following his eating plan approximately 40% of the time. [name] states he is not currently exercising.  Today's visit was #: 4 Starting weight: 266 lbs Starting date: 09/16/2020  Interim History: Phillip Shannon reports that his plan is not going as well as he hoped. He indulged on Easter. He states that he does well with breakfast but struggles with lunch. He is not eating all of his protein at lunch, and has not been weighing his protein before cooking at dinner. He is also drinking grapefruit juice everyday.   Subjective:   1. Vitamin D deficiency Phillip Shannon is taking prescription Vit D and tolerating it well.  Assessment/Plan:   1. Vitamin D deficiency Low Vitamin D level contributes to fatigue and are associated with obesity, breast, and colon cancer. He agrees to continue to take prescription Vitamin D @50 ,000 IU every week and will follow-up for routine testing of Vitamin D, at least 2-3 times per year to avoid over-replacement.  - Vitamin D, Ergocalciferol, (DRISDOL) 1.25 MG (50000 UNIT) CAPS capsule; Take 1 capsule (50,000 Units total) by mouth every 7 (seven) days.  Dispense: 4 capsule; Refill: 0  2. Class 2 severe obesity with serious comorbidity and body mass index (BMI) of 39.0 to 39.9 in adult, unspecified  obesity type [facility]) Phillip Shannon is currently in the action stage of change. As such, his goal is to continue with weight loss efforts. He has agreed to the Category 3 Plan.   Exercise goals: As is  Behavioral modification strategies: increasing lean protein intake and decreasing simple carbohydrates.  Phillip Shannon has agreed to follow-up with our clinic in 4 weeks with Dr. Debroah Loop. He was informed of the importance of frequent follow-up visits to maximize his success with intensive lifestyle modifications for his multiple health conditions.  Objective:   VITALS: Per patient if applicable, see vitals. GENERAL: Alert and in no acute distress. CARDIOPULMONARY: No increased WOB. Speaking in clear sentences.  PSYCH: Pleasant and cooperative. Speech normal rate and rhythm. Affect is appropriate. Insight and judgement are appropriate. Attention is focused, linear, and appropriate.  NEURO: Oriented as arrived to appointment on time with no prompting.   Lab Results  Component Value Date   CREATININE 0.99 09/16/2020   BUN 10 09/16/2020   NA 142 09/16/2020   K 4.8 09/16/2020   CL 103 09/16/2020   CO2 23 09/16/2020   Lab Results  Component Value Date   ALT 16 09/16/2020   AST 20 09/16/2020   ALKPHOS 63 09/16/2020   BILITOT 1.4 (H) 09/16/2020   Lab Results  Component Value Date   HGBA1C 6.0 (H) 09/16/2020   Lab Results  Component Value Date   INSULIN 15.3 09/16/2020   Lab Results  Component Value Date   TSH 1.400 09/16/2020   Lab Results  Component Value Date   CHOL 208 (H) 09/16/2020  HDL 59 09/16/2020   LDLCALC 133 (H) 09/16/2020   TRIG 89 09/16/2020   Lab Results  Component Value Date   WBC 6.3 09/16/2020   HGB 15.3 09/16/2020   HCT 43.9 09/16/2020   MCV 94 09/16/2020   PLT 222 09/16/2020   No results found for: IRON, TIBC, FERRITIN  Attestation Statements:   Reviewed by clinician on day of visit: allergies, medications, problem list, medical history, surgical history,  family history, social history, and previous encounter notes.  Edmund Hilda, am acting as Energy manager for [employer], PA-C.  I have reviewed the above documentation for accuracy and completeness, and I agree with the above. Alois Cliche, PA-C

## 2020-11-17 ENCOUNTER — Other Ambulatory Visit (HOSPITAL_COMMUNITY): Payer: Self-pay

## 2020-11-17 DIAGNOSIS — Z961 Presence of intraocular lens: Secondary | ICD-10-CM | POA: Diagnosis not present

## 2020-11-17 DIAGNOSIS — H00015 Hordeolum externum left lower eyelid: Secondary | ICD-10-CM | POA: Diagnosis not present

## 2020-11-17 MED ORDER — AMOXICILLIN-POT CLAVULANATE 500-125 MG PO TABS
1.0000 | ORAL_TABLET | Freq: Two times a day (BID) | ORAL | 0 refills | Status: DC
  Filled 2020-11-17: qty 14, 7d supply, fill #0

## 2020-11-17 MED ORDER — TOBRAMYCIN 0.3 % OP SOLN
1.0000 [drp] | Freq: Four times a day (QID) | OPHTHALMIC | 0 refills | Status: DC
  Filled 2020-11-17: qty 5, 25d supply, fill #0

## 2020-12-02 ENCOUNTER — Ambulatory Visit (INDEPENDENT_AMBULATORY_CARE_PROVIDER_SITE_OTHER): Payer: PPO | Admitting: Family Medicine

## 2021-01-22 ENCOUNTER — Other Ambulatory Visit (HOSPITAL_COMMUNITY): Payer: Self-pay

## 2021-01-22 DIAGNOSIS — M545 Low back pain, unspecified: Secondary | ICD-10-CM | POA: Diagnosis not present

## 2021-01-22 MED ORDER — TIZANIDINE HCL 2 MG PO TABS
2.0000 mg | ORAL_TABLET | Freq: Three times a day (TID) | ORAL | 1 refills | Status: DC | PRN
  Filled 2021-01-22: qty 60, 10d supply, fill #0

## 2021-01-22 MED ORDER — ETODOLAC 400 MG PO TABS
400.0000 mg | ORAL_TABLET | Freq: Two times a day (BID) | ORAL | 0 refills | Status: DC
  Filled 2021-01-22: qty 60, 30d supply, fill #0

## 2021-02-04 DIAGNOSIS — M5441 Lumbago with sciatica, right side: Secondary | ICD-10-CM | POA: Diagnosis not present

## 2021-02-23 ENCOUNTER — Other Ambulatory Visit: Payer: Self-pay | Admitting: Neurosurgery

## 2021-02-23 DIAGNOSIS — M5441 Lumbago with sciatica, right side: Secondary | ICD-10-CM

## 2021-02-24 DIAGNOSIS — I4891 Unspecified atrial fibrillation: Secondary | ICD-10-CM | POA: Diagnosis not present

## 2021-02-24 DIAGNOSIS — E7800 Pure hypercholesterolemia, unspecified: Secondary | ICD-10-CM | POA: Diagnosis not present

## 2021-02-24 DIAGNOSIS — R972 Elevated prostate specific antigen [PSA]: Secondary | ICD-10-CM | POA: Diagnosis not present

## 2021-02-24 DIAGNOSIS — N529 Male erectile dysfunction, unspecified: Secondary | ICD-10-CM | POA: Diagnosis not present

## 2021-02-24 DIAGNOSIS — Z0000 Encounter for general adult medical examination without abnormal findings: Secondary | ICD-10-CM | POA: Diagnosis not present

## 2021-02-24 DIAGNOSIS — M545 Low back pain, unspecified: Secondary | ICD-10-CM | POA: Diagnosis not present

## 2021-02-24 DIAGNOSIS — E669 Obesity, unspecified: Secondary | ICD-10-CM | POA: Diagnosis not present

## 2021-03-17 ENCOUNTER — Other Ambulatory Visit: Payer: Self-pay

## 2021-03-17 ENCOUNTER — Ambulatory Visit
Admission: RE | Admit: 2021-03-17 | Discharge: 2021-03-17 | Disposition: A | Discharge status: Home / Self Care | Payer: PPO | Source: Ambulatory Visit | Attending: Neurosurgery | Admitting: Neurosurgery

## 2021-03-17 DIAGNOSIS — M4316 Spondylolisthesis, lumbar region: Secondary | ICD-10-CM | POA: Diagnosis not present

## 2021-03-17 DIAGNOSIS — M5441 Lumbago with sciatica, right side: Secondary | ICD-10-CM

## 2021-03-17 DIAGNOSIS — M5116 Intervertebral disc disorders with radiculopathy, lumbar region: Secondary | ICD-10-CM | POA: Diagnosis not present

## 2021-03-17 DIAGNOSIS — M2578 Osteophyte, vertebrae: Secondary | ICD-10-CM | POA: Diagnosis not present

## 2021-03-17 DIAGNOSIS — M48061 Spinal stenosis, lumbar region without neurogenic claudication: Secondary | ICD-10-CM | POA: Diagnosis not present

## 2021-03-17 MED ORDER — GADOBENATE DIMEGLUMINE 529 MG/ML IV SOLN
20.0000 mL | Freq: Once | INTRAVENOUS | Status: AC | PRN
  Administered 2021-03-17: 20 mL via INTRAVENOUS

## 2021-03-18 DIAGNOSIS — R030 Elevated blood-pressure reading, without diagnosis of hypertension: Secondary | ICD-10-CM | POA: Diagnosis not present

## 2021-03-18 DIAGNOSIS — Z6838 Body mass index (BMI) 38.0-38.9, adult: Secondary | ICD-10-CM | POA: Diagnosis not present

## 2021-03-18 DIAGNOSIS — M5441 Lumbago with sciatica, right side: Secondary | ICD-10-CM | POA: Diagnosis not present

## 2021-04-28 ENCOUNTER — Other Ambulatory Visit (HOSPITAL_COMMUNITY): Payer: Self-pay

## 2021-04-28 MED ORDER — ETODOLAC 400 MG PO TABS
400.0000 mg | ORAL_TABLET | Freq: Two times a day (BID) | ORAL | 0 refills | Status: DC
  Filled 2021-04-28: qty 60, 30d supply, fill #0

## 2021-04-28 MED ORDER — TADALAFIL 20 MG PO TABS
20.0000 mg | ORAL_TABLET | Freq: Every day | ORAL | 0 refills | Status: DC | PRN
  Filled 2021-04-28: qty 15, 15d supply, fill #0

## 2021-05-14 DIAGNOSIS — J069 Acute upper respiratory infection, unspecified: Secondary | ICD-10-CM | POA: Diagnosis not present

## 2021-05-14 DIAGNOSIS — I4891 Unspecified atrial fibrillation: Secondary | ICD-10-CM | POA: Diagnosis not present

## 2021-05-22 DIAGNOSIS — Z23 Encounter for immunization: Secondary | ICD-10-CM | POA: Diagnosis not present

## 2021-10-05 ENCOUNTER — Encounter: Payer: Self-pay | Admitting: Cardiovascular Disease

## 2021-10-05 ENCOUNTER — Other Ambulatory Visit: Payer: Self-pay

## 2021-10-05 ENCOUNTER — Ambulatory Visit: Payer: PPO | Admitting: Cardiovascular Disease

## 2021-10-05 ENCOUNTER — Other Ambulatory Visit (HOSPITAL_COMMUNITY): Payer: Self-pay

## 2021-10-05 DIAGNOSIS — G4733 Obstructive sleep apnea (adult) (pediatric): Secondary | ICD-10-CM

## 2021-10-05 DIAGNOSIS — I4819 Other persistent atrial fibrillation: Secondary | ICD-10-CM | POA: Diagnosis not present

## 2021-10-05 DIAGNOSIS — E782 Mixed hyperlipidemia: Secondary | ICD-10-CM | POA: Diagnosis not present

## 2021-10-05 DIAGNOSIS — E669 Obesity, unspecified: Secondary | ICD-10-CM | POA: Diagnosis not present

## 2021-10-05 DIAGNOSIS — E66811 Obesity, class 1: Secondary | ICD-10-CM

## 2021-10-05 MED ORDER — ATORVASTATIN CALCIUM 40 MG PO TABS
40.0000 mg | ORAL_TABLET | Freq: Every day | ORAL | 3 refills | Status: DC
  Filled 2021-10-05: qty 90, 90d supply, fill #0

## 2021-10-05 NOTE — Assessment & Plan Note (Signed)
History of obesity with a desire to lose weight.  He has been to weight loss center with mixed results. ?

## 2021-10-05 NOTE — Assessment & Plan Note (Signed)
History of persistent atrial fibrillation rate controlled not on oral anticoagulation because his CHA2DSVASC2 score is 1   . ?

## 2021-10-05 NOTE — Assessment & Plan Note (Signed)
History of obstructive sleep apnea on BiPAP intermittently ?

## 2021-10-05 NOTE — Assessment & Plan Note (Signed)
History of hyperlipidemia on statin therapy with lipid profile performed 02/24/2021 revealing total cholesterol 191, LDL 125 and HDL 50, not at goal for primary prevention.  I am going to increase his atorvastatin from 20 to 40 mg a day and we will recheck a fasting lipid profile in 3 months. ?

## 2021-10-05 NOTE — Progress Notes (Signed)
? ? ? ?10/05/2021 ?Phillip Shannon   ?27-Dec-1950  ?[id number] ? ?Primary Physician Clovis Riley, L.August Saucer, MD ?Primary Cardiologist: Runell Gess MD Nicholes Calamity, [place] ? ?HPI:  ADDEN STROUT is a 71 y.o.  mildly overweight married African-American male father of 2 biologic and 2 stepchildren, grandfather to 10 grandchildren referred by Dr. [name] for evaluation of PAF. 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 He has no coronary vessel risk factors. He has never had a heart attack or stroke and denies chest pain or shortness of breath. On routine exam recently Dr. Clovis Riley noted an irregular heartbeat and confirmed PAF on a 12-lead cardiogram. Mr. Punch is in A. fib with controlled ventricular response today.The CHA2DSVASC2 score is  1 . He denies chest pain, shortness of breath or dizziness. He did have a recent 2-D echocardiogram that was essentially normal .  He had a sleep study that showed severe sleep apnea and is currently on BiPAP followed by Dr. Tresa Endo with marked improvement in his symptoms. ?  ?Since I saw him over a year ago he continues to be in A-fib rate controlled not on oral anticoagulation.  He denies chest pain or shortness of breath.  He is unaware that he is in A-fib.. ?  ? ? ?Current Meds  ?Medication Sig  ? aspirin EC 81 MG tablet Take 1 tablet (81 mg total) by mouth daily.  ? atorvastatin (LIPITOR) 20 MG tablet Take 40 mg by mouth daily.  ? diclofenac (VOLTAREN) 75 MG EC tablet Take 1 tablet by mouth 2 times a day with food or  milk as needed for pain  ? etodolac (LODINE) 400 MG tablet Take 1 tablet (400 mg total) by mouth 2 (two) times daily with food for pain and inflammation  ? metoprolol succinate (TOPROL XL) 25 MG 24 hr tablet Take 1 tablet (25 mg total) by mouth at bedtime.  ? sildenafil (VIAGRA) 100 MG tablet TAKE 1/2 TO 1 TABLET BY MOUTH AS NEEDED FOR SEXUAL FUNCTION.  ? tadalafil (CIALIS) 20 MG tablet Take 1 tablet (20  mg total) by mouth daily as needed for sexual function  ? tiZANidine (ZANAFLEX) 2 MG tablet Take 1-2 tablets (2-4 mg total) by mouth every 8 (eight) hours as needed for muscle spasms.  ? Vitamin D, Ergocalciferol, (DRISDOL) 1.25 MG (50000 UNIT) CAPS capsule Take 1 capsule (50,000 Units total) by mouth every 7 (seven) days.  ? [DISCONTINUED] amoxicillin-clavulanate (AUGMENTIN) 500-125 MG tablet Take 1 tablet (500 mg total) by mouth 2 (two) times daily.  ? [DISCONTINUED] etodolac (LODINE) 400 MG tablet Take 1 tablet (400 mg total) by mouth 2 (two) times daily with food for pain and inflammation.  ? [DISCONTINUED] tobramycin (TOBREX) 0.3 % ophthalmic solution Place 1 drop into the left eye 4 (four) times daily.  ?  ? ?Allergies  ?Allergen Reactions  ? Hornet Venom Anaphylaxis, Hives and Swelling  ?  Swelling all over   ? Codeine Nausea And Vomiting  ? ? ?Social History  ? ?Socioeconomic History  ? Marital status: Married  ?  Spouse name: Not on file  ? Number of children: 2  ? Years of education: Not on file  ? Highest education level: Not on file  ?Occupational History  ? Occupation: school bus driver  ?Tobacco Use  ? Smoking status: Never  ? Smokeless tobacco: Never  ?Substance and Sexual Activity  ? Alcohol use: No  ? Drug  use: No  ? Sexual activity: Not on file  ?Other Topics Concern  ? Not on file  ?Social History Narrative  ? Lives with his wife.  ? Adult children do not live locally.  ? ?Social Determinants of Health  ? ?Financial Resource Strain: Not on file  ?Food Insecurity: Not on file  ?Transportation Needs: Not on file  ?Physical Activity: Not on file  ?Stress: Not on file  ?Social Connections: Not on file  ?Intimate Partner Violence: Not on file  ?  ? ?Review of Systems: ?General: negative for chills, fever, night sweats or weight changes.  ?Cardiovascular: negative for chest pain, dyspnea on exertion, edema, orthopnea, palpitations, paroxysmal nocturnal dyspnea or shortness of breath ?Dermatological:  negative for rash ?Respiratory: negative for cough or wheezing ?Urologic: negative for hematuria ?Abdominal: negative for nausea, vomiting, diarrhea, bright red blood per rectum, melena, or hematemesis ?Neurologic: negative for visual changes, syncope, or dizziness ?All other systems reviewed and are otherwise negative except as noted above. ? ? ? ?Blood pressure 132/90, pulse 89, height 5' 10.5" (1.791 m), weight 270 lb (122.5 kg), SpO2 97 %.  ?General appearance: alert and no distress ?Neck: no adenopathy, no carotid bruit, no JVD, supple, symmetrical, trachea midline, and thyroid not enlarged, symmetric, no tenderness/mass/nodules ?Lungs: clear to auscultation bilaterally ?Heart: irregularly irregular rhythm ?Extremities: extremities normal, atraumatic, no cyanosis or edema ?Pulses: 2+ and symmetric ?Skin: Skin color, texture, turgor normal. No rashes or lesions ?Neurologic: Grossly normal ? ?EKG atrial fibrillation with a ventricular sponsor of 89.  I personally reviewed this EKG. ? ?ASSESSMENT AND PLAN:  ? ?Atrial fibrillation, persistent (HCC) ?History of persistent atrial fibrillation rate controlled not on oral anticoagulation because his CHA2DSVASC2 score is 1   . ? ?Obesity (BMI 30.0-34.9) ?History of obesity with a desire to lose weight.  He has been to weight loss center with mixed results. ? ?Sleep apnea ?History of obstructive sleep apnea on BiPAP intermittently ? ?Hyperlipidemia ?History of hyperlipidemia on statin therapy with lipid profile performed 02/24/2021 revealing total cholesterol 191, LDL 125 and HDL 50, not at goal for primary prevention.  I am going to increase his atorvastatin from 20 to 40 mg a day and we will recheck a fasting lipid profile in 3 months. ? ? ? ? ?Runell Gess MD FACP,FACC,FAHA, FSCAI ?10/05/2021 ?10:27 AM ?

## 2021-10-05 NOTE — Patient Instructions (Signed)
Medication Instructions:  ? ?-Increase atorvastatin (lipitor) to 40mg  once daily. ? ?*If you need a refill on your cardiac medications before your next appointment, please call your pharmacy* ? ? ?Lab Work: ?Your physician recommends that you return for lab work in: 3 months for FASTING lipid and liver profile. ? ?If you have labs (blood work) drawn today and your tests are completely normal, you will receive your results only by: ?MyChart Message (if you have MyChart) OR ?A paper copy in the mail ?If you have any lab test that is abnormal or we need to change your treatment, we will call you to review the results. ? ? ?Follow-Up: ?At [facility], you and your health needs are our priority.  As part of our continuing mission to provide you with exceptional heart care, we have created designated Provider Care Teams.  These Care Teams include your primary Cardiologist (physician) and Advanced Practice Providers (APPs -  Physician Assistants and Nurse Practitioners) who all work together to provide you with the care you need, when you need it. ? ?We recommend signing up for the patient portal called "MyChart".  Sign up information is provided on this After Visit Summary.  MyChart is used to connect with patients for Virtual Visits (Telemedicine).  Patients are able to view lab/test results, encounter notes, upcoming appointments, etc.  Non-urgent messages can be sent to your provider as well.   ?To learn more about what you can do with MyChart, go to [email or web address].   ? ?Your next appointment:   ?12 month(s) ? ?The format for your next appointment:   ?In Person ? ?Provider:   ?Quay Burow, MD  ?

## 2021-11-02 ENCOUNTER — Other Ambulatory Visit (HOSPITAL_COMMUNITY): Payer: Self-pay

## 2021-11-02 DIAGNOSIS — Z6839 Body mass index (BMI) 39.0-39.9, adult: Secondary | ICD-10-CM | POA: Diagnosis not present

## 2021-11-02 DIAGNOSIS — E669 Obesity, unspecified: Secondary | ICD-10-CM | POA: Diagnosis not present

## 2021-11-02 MED ORDER — WEGOVY 0.25 MG/0.5ML ~~LOC~~ SOAJ
0.2500 mg | SUBCUTANEOUS | 0 refills | Status: DC
  Filled 2021-11-02: qty 2, 28d supply, fill #0

## 2021-11-10 DIAGNOSIS — Z961 Presence of intraocular lens: Secondary | ICD-10-CM | POA: Diagnosis not present

## 2021-11-10 DIAGNOSIS — H524 Presbyopia: Secondary | ICD-10-CM | POA: Diagnosis not present

## 2021-12-30 ENCOUNTER — Other Ambulatory Visit (HOSPITAL_COMMUNITY): Payer: Self-pay

## 2021-12-30 DIAGNOSIS — R509 Fever, unspecified: Secondary | ICD-10-CM | POA: Diagnosis not present

## 2021-12-30 DIAGNOSIS — R059 Cough, unspecified: Secondary | ICD-10-CM | POA: Diagnosis not present

## 2021-12-30 MED ORDER — SULFAMETHOXAZOLE-TRIMETHOPRIM 800-160 MG PO TABS
1.0000 | ORAL_TABLET | Freq: Two times a day (BID) | ORAL | 0 refills | Status: DC
  Filled 2021-12-30: qty 14, 7d supply, fill #0

## 2021-12-30 MED ORDER — HYDROCODONE BIT-HOMATROP MBR 5-1.5 MG/5ML PO SOLN
5.0000 mL | Freq: Four times a day (QID) | ORAL | 0 refills | Status: DC | PRN
  Filled 2021-12-30: qty 120, 6d supply, fill #0

## 2022-02-23 ENCOUNTER — Encounter (INDEPENDENT_AMBULATORY_CARE_PROVIDER_SITE_OTHER): Payer: Self-pay

## 2022-03-08 DIAGNOSIS — I4891 Unspecified atrial fibrillation: Secondary | ICD-10-CM | POA: Diagnosis not present

## 2022-03-08 DIAGNOSIS — R972 Elevated prostate specific antigen [PSA]: Secondary | ICD-10-CM | POA: Diagnosis not present

## 2022-03-08 DIAGNOSIS — Z23 Encounter for immunization: Secondary | ICD-10-CM | POA: Diagnosis not present

## 2022-03-08 DIAGNOSIS — Z0000 Encounter for general adult medical examination without abnormal findings: Secondary | ICD-10-CM | POA: Diagnosis not present

## 2022-03-08 DIAGNOSIS — M545 Low back pain, unspecified: Secondary | ICD-10-CM | POA: Diagnosis not present

## 2022-03-08 DIAGNOSIS — E669 Obesity, unspecified: Secondary | ICD-10-CM | POA: Diagnosis not present

## 2022-03-08 DIAGNOSIS — E7800 Pure hypercholesterolemia, unspecified: Secondary | ICD-10-CM | POA: Diagnosis not present

## 2022-03-08 DIAGNOSIS — N529 Male erectile dysfunction, unspecified: Secondary | ICD-10-CM | POA: Diagnosis not present

## 2022-03-08 LAB — COMPREHENSIVE METABOLIC PANEL
Albumin: 4.1 (ref 3.5–5.0)
Calcium: 9.7 (ref 8.7–10.7)
eGFR: 87

## 2022-03-08 LAB — LIPID PANEL
Cholesterol: 202 — AB (ref 0–200)
HDL: 53 (ref 35–70)
LDL Cholesterol: 135
Triglycerides: 78 (ref 40–160)

## 2022-03-08 LAB — BASIC METABOLIC PANEL
BUN: 11 (ref 4–21)
CO2: 29 — AB (ref 13–22)
Chloride: 105 (ref 99–108)
Creatinine: 0.9 (ref 0.6–1.3)
Glucose: 95
Potassium: 4.6 mEq/L (ref 3.5–5.1)
Sodium: 139 (ref 137–147)

## 2022-03-08 LAB — HEPATIC FUNCTION PANEL
ALT: 11 U/L (ref 10–40)
AST: 15 (ref 14–40)
Alkaline Phosphatase: 54 (ref 25–125)
Bilirubin, Total: 1.5

## 2022-03-22 ENCOUNTER — Other Ambulatory Visit (HOSPITAL_COMMUNITY): Payer: Self-pay

## 2022-03-22 MED ORDER — TADALAFIL 20 MG PO TABS
20.0000 mg | ORAL_TABLET | Freq: Every day | ORAL | 5 refills | Status: DC | PRN
  Filled 2022-03-22: qty 15, 15d supply, fill #0

## 2022-03-25 ENCOUNTER — Other Ambulatory Visit (HOSPITAL_COMMUNITY): Payer: Self-pay

## 2022-03-28 ENCOUNTER — Other Ambulatory Visit (HOSPITAL_COMMUNITY): Payer: Self-pay

## 2022-03-28 MED ORDER — TADALAFIL 20 MG PO TABS
20.0000 mg | ORAL_TABLET | Freq: Every day | ORAL | 5 refills | Status: DC
  Filled 2022-03-28: qty 15, 15d supply, fill #0

## 2022-03-29 ENCOUNTER — Ambulatory Visit (INDEPENDENT_AMBULATORY_CARE_PROVIDER_SITE_OTHER): Payer: PPO | Admitting: Nurse Practitioner

## 2022-03-29 ENCOUNTER — Encounter (INDEPENDENT_AMBULATORY_CARE_PROVIDER_SITE_OTHER): Payer: Self-pay | Admitting: Nurse Practitioner

## 2022-03-29 VITALS — BP 127/87 | HR 54 | Temp 98.4°F | Ht 69.0 in | Wt 262.0 lb

## 2022-03-29 DIAGNOSIS — E782 Mixed hyperlipidemia: Secondary | ICD-10-CM

## 2022-03-29 DIAGNOSIS — G4733 Obstructive sleep apnea (adult) (pediatric): Secondary | ICD-10-CM | POA: Diagnosis not present

## 2022-03-29 DIAGNOSIS — Z6838 Body mass index (BMI) 38.0-38.9, adult: Secondary | ICD-10-CM

## 2022-03-29 DIAGNOSIS — E669 Obesity, unspecified: Secondary | ICD-10-CM

## 2022-03-30 ENCOUNTER — Other Ambulatory Visit (HOSPITAL_COMMUNITY): Payer: Self-pay

## 2022-03-31 NOTE — Progress Notes (Signed)
Chief Complaint:   OBESITY Phillip Shannon is here to discuss his progress with his obesity treatment plan along with follow-up of his obesity related diagnoses. [name] is on the Category 3 Plan and states he is following his eating plan approximately 40% of the time. Tayson states he is walking 30 minutes 5 times per week.  Today's visit was #: 5 Starting weight: 266 lbs Starting date: 09/16/2020 Today's weight: 262 lbs Today's date: 03/29/2022 Total lbs lost to date: 4 lbs Total lbs lost since last in-office visit: 0  Interim History: [name] was seen here last on video visit on 11/05/20 and in person on 10/15/20. Has been watching his portion size and making healthier choices. He lives with his wife who does all the cooking. He is eating 2 meals and 2-3 snacks daily. "Snacks on everything I should not". Wants to lose weight for his health, the way he looks and improved energy. Drinks 2 bottles of water and soda.  Subjective:   1. Mixed hyperlipidemia Xerxes is taking Lipitor 40 mg. Denies any side effects.  2. OSA (obstructive sleep apnea) Sylvester is wearing Bipap around 2 nights per week.  Assessment/Plan:   1. Mixed hyperlipidemia Continue to follow up with Cardio and continue medications as directed. Cardiovascular risk and specific lipid/LDL goals reviewed.  We discussed several lifestyle modifications today and Phillip Shannon will continue to work on diet, exercise and weight loss efforts. Orders and follow up as documented in patient record.   Counseling Intensive lifestyle modifications are the first line treatment for this issue. Dietary changes: Increase soluble fiber. Decrease simple carbohydrates. Exercise changes: Moderate to vigorous-intensity aerobic activity 150 minutes per week if tolerated. Lipid-lowering medications: see documented in medical record.   2. OSA (obstructive sleep apnea) Discussed the importance of using CPAP nightly and the risks associated with untreated sleep  apnea such as but not limited to [facility], MI and CVA, etc.    3. obesity with current BMI 38.7 Phillip Shannon is currently in the action stage of change. As such, his goal is to continue with weight loss efforts. He has agreed to the Category 3 Plan.   Exercise goals: As is.  IC and labs at next visit. Avoid Phentermine, Qsymia or Contrave due to Afib.  Behavioral modification strategies: increasing lean protein intake, increasing vegetables, and increasing water intake.  Phillip Shannon has agreed to follow-up with our clinic in 2 weeks. He was informed of the importance of frequent follow-up visits to maximize his success with intensive lifestyle modifications for his multiple health conditions.   Objective:   Blood pressure 127/87, pulse (!) 54, temperature 98.4 F (36.9 C), height 5\' 9"  (1.753 m), weight 262 lb (118.8 kg), SpO2 98 %. Body mass index is 38.69 kg/m.  General: Cooperative, alert, well developed, in no acute distress. HEENT: Conjunctivae and lids unremarkable. Cardiovascular: Regular rhythm.  Lungs: Normal work of breathing. Neurologic: No focal deficits.   Lab Results  Component Value Date   CREATININE 0.99 09/16/2020   BUN 10 09/16/2020   NA 142 09/16/2020   K 4.8 09/16/2020   CL 103 09/16/2020   CO2 23 09/16/2020   Lab Results  Component Value Date   ALT 16 09/16/2020   AST 20 09/16/2020   ALKPHOS 63 09/16/2020   BILITOT 1.4 (H) 09/16/2020   Lab Results  Component Value Date   HGBA1C 6.0 (H) 09/16/2020   Lab Results  Component Value Date   INSULIN 15.3 09/16/2020   Lab Results  Component  Value Date   TSH 1.400 09/16/2020   Lab Results  Component Value Date   CHOL 208 (H) 09/16/2020   HDL 59 09/16/2020   LDLCALC 133 (H) 09/16/2020   TRIG 89 09/16/2020   Lab Results  Component Value Date   VD25OH 13.9 (L) 09/16/2020   Lab Results  Component Value Date   WBC 6.3 09/16/2020   HGB 15.3 09/16/2020   HCT 43.9 09/16/2020   MCV 94 09/16/2020   PLT 222  09/16/2020   No results found for: "IRON", "TIBC", "FERRITIN"  Attestation Statements:   Reviewed by clinician on day of visit: allergies, medications, problem list, medical history, surgical history, family history, social history, and previous encounter notes.  I spent 30 minutes with the patient and reviewing chart before and after visit.    I, Brendell Tyus, RMA, am acting as transcriptionist for Irene Limbo, FNP.  I have reviewed the above documentation for accuracy and completeness, and I agree with the above. Irene Limbo, FNP

## 2022-04-01 ENCOUNTER — Other Ambulatory Visit (HOSPITAL_COMMUNITY): Payer: Self-pay

## 2022-04-21 ENCOUNTER — Ambulatory Visit (INDEPENDENT_AMBULATORY_CARE_PROVIDER_SITE_OTHER): Payer: PPO | Admitting: Family Medicine

## 2022-05-07 DIAGNOSIS — Z23 Encounter for immunization: Secondary | ICD-10-CM | POA: Diagnosis not present

## 2022-05-18 ENCOUNTER — Other Ambulatory Visit (HOSPITAL_COMMUNITY): Payer: Self-pay

## 2022-05-18 ENCOUNTER — Ambulatory Visit (INDEPENDENT_AMBULATORY_CARE_PROVIDER_SITE_OTHER): Payer: PPO | Admitting: Family Medicine

## 2022-05-18 ENCOUNTER — Encounter (INDEPENDENT_AMBULATORY_CARE_PROVIDER_SITE_OTHER): Payer: Self-pay | Admitting: Family Medicine

## 2022-05-18 VITALS — BP 134/84 | HR 76 | Temp 98.2°F | Ht 69.0 in | Wt 265.0 lb

## 2022-05-18 DIAGNOSIS — R7303 Prediabetes: Secondary | ICD-10-CM

## 2022-05-18 DIAGNOSIS — E669 Obesity, unspecified: Secondary | ICD-10-CM | POA: Diagnosis not present

## 2022-05-18 DIAGNOSIS — Z6839 Body mass index (BMI) 39.0-39.9, adult: Secondary | ICD-10-CM

## 2022-05-18 DIAGNOSIS — E559 Vitamin D deficiency, unspecified: Secondary | ICD-10-CM

## 2022-05-18 DIAGNOSIS — I10 Essential (primary) hypertension: Secondary | ICD-10-CM | POA: Diagnosis not present

## 2022-05-18 MED ORDER — VITAMIN D (ERGOCALCIFEROL) 1.25 MG (50000 UNIT) PO CAPS
50000.0000 [IU] | ORAL_CAPSULE | ORAL | 0 refills | Status: DC
  Filled 2022-05-18: qty 4, 28d supply, fill #0

## 2022-05-28 NOTE — Progress Notes (Signed)
Chief Complaint:   OBESITY Phillip Shannon is here to discuss his progress with his obesity treatment plan along with follow-up of his obesity related diagnoses. Phillip Shannon is on the Category 3 Plan and states he is following his eating plan approximately 50% of the time. Phillip Shannon states he is walking 20-30 minutes 5-6 times per week.  Today's visit was #: 6 Starting weight: 266 lbs Starting date: 09/16/2020 Today's weight: 265 lbs Today's date: 05/18/2022 Total lbs lost to date: 1 Total lbs lost since last in-office visit: +3  Interim History: Phillip Shannon changed back to category 3 at his last OV with Phillip Maryland, NP. This is his first OV with me.  Breakfast= 2 slices of toast, salmon patty with breading, 1/2 cup of pink grapefruit juice, and tang Lunch= 1-2 slices bologna with lettuce, tomato, onions, and mayo  Dinner= [place] with gravy, stuffing, turnip greens, yam casserole from [employer] Snacks= peanut butter crackers made by pt, himself  Subjective:   1. Prediabetes Phillip Shannon's insurance won't cover [phone] and, per pt, we tried to get Ozempic covered and couldn't.  2. Essential hypertension Cardiovascular ROS: no chest pain or dyspnea on exertion.  3. Vitamin D deficiency He is currently taking prescription vitamin D 50,000 IU each week. He denies nausea, vomiting or muscle weakness.  Assessment/Plan:   Orders Placed This Encounter  Procedures   Basic metabolic panel   Comprehensive metabolic panel   Lipid panel   Hepatic function panel    Medications Discontinued During This Encounter  Medication Reason   Semaglutide-Weight Management (WEGOVY) 0.25 MG/0.5ML SOAJ Patient Preference   sildenafil (VIAGRA) 100 MG tablet Patient Preference   sulfamethoxazole-trimethoprim (BACTRIM DS) 800-160 MG tablet Patient Preference   tiZANidine (ZANAFLEX) 2 MG tablet Patient Preference   Vitamin D, Ergocalciferol, (DRISDOL) 1.25 MG (50000 UNIT) CAPS capsule Reorder     Meds ordered this encounter   Medications   Vitamin D, Ergocalciferol, (DRISDOL) 1.25 MG (50000 UNIT) CAPS capsule    Sig: Take 1 capsule (50,000 Units total) by mouth every 7 (seven) days.    Dispense:  4 capsule    Refill:  0     1. Prediabetes Phillip Shannon will continue to work on weight loss, exercise, and decreasing simple carbohydrates to help decrease the risk of diabetes.  Explained to pt that it is not about taking a shot, but we must learn to eat healthier and choose healthier foods.  2. Essential hypertension BP at goal. Phillip Shannon is working on healthy weight loss and exercise to improve blood pressure control. We will watch for signs of hypotension as he continues his lifestyle modifications. Continue current treatment plan per PCP.  3. Vitamin D deficiency Low Vitamin D level contributes to fatigue and are associated with obesity, breast, and colon cancer. He agrees to continue to take prescription Vitamin D @50 ,000 IU every week and will follow-up for routine testing of Vitamin D, at least 2-3 times per year to avoid over-replacement.  Refill- Vitamin D, Ergocalciferol, (DRISDOL) 1.25 MG (50000 UNIT) CAPS capsule; Take 1 capsule (50,000 Units total) by mouth every 7 (seven) days.  Dispense: 4 capsule; Refill: 0  4. Obesity with current BMI of 39.2 Phillip Shannon is currently in the action stage of change. As such, his goal is to continue with weight loss efforts. He has agreed to the Category 3 Plan.   Exercise goals:  As is  Behavioral modification strategies: increasing lean protein intake and decreasing simple carbohydrates.  Phillip Shannon has agreed to follow-up with our clinic  in 2-3 weeks. He was informed of the importance of frequent follow-up visits to maximize his success with intensive lifestyle modifications for his multiple health conditions.   Objective:   Blood pressure 134/84, pulse 76, temperature 98.2 F (36.8 C), height 5\' 9"  (1.753 m), weight 265 lb (120.2 kg), SpO2 98 %. Body mass index is 39.13  kg/m.  General: Cooperative, alert, well developed, in no acute distress. HEENT: Conjunctivae and lids unremarkable. Cardiovascular: Regular rhythm.  Lungs: Normal work of breathing. Neurologic: No focal deficits.   Lab Results  Component Value Date   CREATININE 0.9 03/08/2022   BUN 11 03/08/2022   NA 139 03/08/2022   K 4.6 03/08/2022   CL 105 03/08/2022   CO2 29 (A) 03/08/2022   Lab Results  Component Value Date   ALT 11 03/08/2022   AST 15 03/08/2022   ALKPHOS 54 03/08/2022   BILITOT 1.4 (H) 09/16/2020   Lab Results  Component Value Date   HGBA1C 6.0 (H) 09/16/2020   Lab Results  Component Value Date   INSULIN 15.3 09/16/2020   Lab Results  Component Value Date   TSH 1.400 09/16/2020   Lab Results  Component Value Date   CHOL 202 (A) 03/08/2022   HDL 53 03/08/2022   LDLCALC 135 03/08/2022   TRIG 78 03/08/2022   Lab Results  Component Value Date   VD25OH 13.9 (L) 09/16/2020   Lab Results  Component Value Date   WBC 6.3 09/16/2020   HGB 15.3 09/16/2020   HCT 43.9 09/16/2020   MCV 94 09/16/2020   PLT 222 09/16/2020   Attestation Statements:   Reviewed by clinician on day of visit: allergies, medications, problem list, medical history, surgical history, family history, social history, and previous encounter notes.  Time spent on visit including pre-visit chart review and post-visit care and charting was 40 minutes.   I, 11/16/2020, BS, CMA, am acting as transcriptionist for Kyung Rudd, DO.   I have reviewed the above documentation for accuracy and completeness, and I agree with the above. [employer], D.O.  The 21st Century Cures Act was signed into law in 2016 which includes the topic of electronic health records.  This provides immediate access to information in MyChart.  This includes consultation notes, operative notes, office notes, lab results and pathology reports.  If you have any questions about what you read please let 2017 know  at your next visit so we can discuss your concerns and take corrective action if need be.  We are right here with you.

## 2022-06-06 ENCOUNTER — Ambulatory Visit (INDEPENDENT_AMBULATORY_CARE_PROVIDER_SITE_OTHER): Payer: PPO | Admitting: Physician Assistant

## 2022-06-14 ENCOUNTER — Ambulatory Visit (INDEPENDENT_AMBULATORY_CARE_PROVIDER_SITE_OTHER): Payer: PPO | Admitting: Physician Assistant

## 2022-06-27 ENCOUNTER — Ambulatory Visit (INDEPENDENT_AMBULATORY_CARE_PROVIDER_SITE_OTHER): Payer: PPO | Admitting: Physician Assistant

## 2022-06-27 ENCOUNTER — Ambulatory Visit (INDEPENDENT_AMBULATORY_CARE_PROVIDER_SITE_OTHER): Payer: PPO | Admitting: Family Medicine

## 2022-07-06 DIAGNOSIS — Z131 Encounter for screening for diabetes mellitus: Secondary | ICD-10-CM | POA: Diagnosis not present

## 2022-07-25 ENCOUNTER — Ambulatory Visit (INDEPENDENT_AMBULATORY_CARE_PROVIDER_SITE_OTHER): Payer: PPO | Admitting: Family Medicine

## 2022-07-25 ENCOUNTER — Encounter (INDEPENDENT_AMBULATORY_CARE_PROVIDER_SITE_OTHER): Payer: Self-pay | Admitting: Family Medicine

## 2022-07-25 VITALS — BP 123/84 | HR 65 | Temp 98.1°F | Ht 69.0 in | Wt 260.0 lb

## 2022-07-25 DIAGNOSIS — E669 Obesity, unspecified: Secondary | ICD-10-CM

## 2022-07-25 DIAGNOSIS — R7303 Prediabetes: Secondary | ICD-10-CM

## 2022-07-25 DIAGNOSIS — Z6838 Body mass index (BMI) 38.0-38.9, adult: Secondary | ICD-10-CM

## 2022-07-25 DIAGNOSIS — E559 Vitamin D deficiency, unspecified: Secondary | ICD-10-CM | POA: Diagnosis not present

## 2022-07-26 DIAGNOSIS — R7303 Prediabetes: Secondary | ICD-10-CM | POA: Insufficient documentation

## 2022-07-26 DIAGNOSIS — E559 Vitamin D deficiency, unspecified: Secondary | ICD-10-CM | POA: Insufficient documentation

## 2022-08-08 NOTE — Progress Notes (Signed)
Chief Complaint:   OBESITY Phillip Shannon is here to discuss his progress with his obesity treatment plan along with follow-up of his obesity related diagnoses. Phillip Shannon is on the Category 3 Plan and states he is following his eating plan approximately 50% of the time. Phillip Shannon states he is not exercising.   Today's visit was #: 7 Starting weight: 13 LBS Starting date: 09/16/2020 Today's weight: 260 LBS Today's date: 07/25/2022 Total lbs lost to date: 6 LBS Total lbs lost since last in-office visit: 5 LBS  Interim History: Watching portion sizes.  Admits to poor choices over the holidays.  Lacks protein in the mornings.  Eating toast or cereal in the mornings.  Wife is supportive.  Reduced meals out.  Plans to add in gym workouts.   Subjective:   1. Prediabetes Last A1c 6.0 March 2022.  Actively reducing the intake of sweets, currently lacks regular exercise.   2. Vitamin D deficiency Last vitamin D level 09/16/2020, 13.9.  Patient was on prescription vitamin D 50,000 IU weekly but ran out.  Assessment/Plan:   1. Prediabetes Consider adding metformin.  Check A1c in 4 weeks, labs ordered.  - Hemoglobin A1c  2. Vitamin D deficiency Check vitamin D level at next office visit, labs ordered.  - VITAMIN D 25 Hydroxy (Vit-D Deficiency, Fractures)  3. Obesity,current BMI 38.5 Trade out cereal for special K protein cereal with  fair life milk.  Goal set for gym workouts 3 times a week, cardio and weight training.   Phillip Shannon is currently in the action stage of change. As such, his goal is to continue with weight loss efforts. He has agreed to the Category 3 Plan.   Exercise goals: All adults should avoid inactivity. Some physical activity is better than none, and adults who participate in any amount of physical activity gain some health benefits.  Behavioral modification strategies: increasing lean protein intake, increasing vegetables, increasing water intake, decreasing eating out, meal  planning and cooking strategies, keeping healthy foods in the home, and planning for success, decreasing junk food.  Phillip Shannon has agreed to follow-up with our clinic in 4 weeks. He was informed of the importance of frequent follow-up visits to maximize his success with intensive lifestyle modifications for his multiple health conditions.   Objective:   Blood pressure 123/84, pulse 65, temperature 98.1 F (36.7 C), height 5\' 9"  (1.753 m), weight 260 lb (117.9 kg), SpO2 97 %. Body mass index is 38.4 kg/m.  General: Cooperative, alert, well developed, in no acute distress. HEENT: Conjunctivae and lids unremarkable. Cardiovascular: Regular rhythm.  Lungs: Normal work of breathing. Neurologic: No focal deficits.   Lab Results  Component Value Date   CREATININE 0.9 03/08/2022   BUN 11 03/08/2022   NA 139 03/08/2022   K 4.6 03/08/2022   CL 105 03/08/2022   CO2 29 (A) 03/08/2022   Lab Results  Component Value Date   ALT 11 03/08/2022   AST 15 03/08/2022   ALKPHOS 54 03/08/2022   BILITOT 1.4 (H) 09/16/2020   Lab Results  Component Value Date   HGBA1C 6.0 (H) 09/16/2020   Lab Results  Component Value Date   INSULIN 15.3 09/16/2020   Lab Results  Component Value Date   TSH 1.400 09/16/2020   Lab Results  Component Value Date   CHOL 202 (A) 03/08/2022   HDL 53 03/08/2022   LDLCALC 135 03/08/2022   TRIG 78 03/08/2022   Lab Results  Component Value Date   VD25OH 13.9 (  L) 09/16/2020   Lab Results  Component Value Date   WBC 6.3 09/16/2020   HGB 15.3 09/16/2020   HCT 43.9 09/16/2020   MCV 94 09/16/2020   PLT 222 09/16/2020   No results found for: "IRON", "TIBC", "FERRITIN"  Attestation Statements:   Reviewed by clinician on day of visit: allergies, medications, problem list, medical history, surgical history, family history, social history, and previous encounter notes.  I, Davy Pique, am acting as Location manager for Loyal Gambler, DO.  I have reviewed the  above documentation for accuracy and completeness, and I agree with the above. Dell Ponto, DO

## 2022-08-22 ENCOUNTER — Ambulatory Visit (INDEPENDENT_AMBULATORY_CARE_PROVIDER_SITE_OTHER): Payer: PPO | Admitting: Family Medicine

## 2022-08-23 ENCOUNTER — Other Ambulatory Visit (HOSPITAL_COMMUNITY): Payer: Self-pay

## 2022-08-23 ENCOUNTER — Encounter (INDEPENDENT_AMBULATORY_CARE_PROVIDER_SITE_OTHER): Payer: Self-pay | Admitting: Family Medicine

## 2022-08-23 ENCOUNTER — Ambulatory Visit (INDEPENDENT_AMBULATORY_CARE_PROVIDER_SITE_OTHER): Payer: PPO | Admitting: Family Medicine

## 2022-08-23 VITALS — BP 124/80 | HR 78 | Temp 98.0°F | Ht 69.0 in | Wt 262.0 lb

## 2022-08-23 DIAGNOSIS — Z6838 Body mass index (BMI) 38.0-38.9, adult: Secondary | ICD-10-CM | POA: Insufficient documentation

## 2022-08-23 DIAGNOSIS — R5383 Other fatigue: Secondary | ICD-10-CM

## 2022-08-23 DIAGNOSIS — E559 Vitamin D deficiency, unspecified: Secondary | ICD-10-CM | POA: Diagnosis not present

## 2022-08-23 DIAGNOSIS — E669 Obesity, unspecified: Secondary | ICD-10-CM | POA: Insufficient documentation

## 2022-08-23 MED ORDER — VITAMIN D (ERGOCALCIFEROL) 1.25 MG (50000 UNIT) PO CAPS
50000.0000 [IU] | ORAL_CAPSULE | ORAL | 0 refills | Status: DC
  Filled 2022-08-23: qty 4, 28d supply, fill #0

## 2022-08-26 ENCOUNTER — Other Ambulatory Visit (HOSPITAL_COMMUNITY): Payer: Self-pay

## 2022-09-06 NOTE — Progress Notes (Unsigned)
Chief Complaint:   OBESITY Phillip Shannon is here to discuss his progress with his obesity treatment plan along with follow-up of his obesity related diagnoses. Phillip Shannon is on the Category 3 Plan and states he is following his eating plan approximately 50% of the time. Phillip Shannon states he is doing 0 minutes 0 times per week.  Today's visit was #: 8 Starting weight: 266 lbs Starting date: 09/16/2020 Today's weight: 262 lbs Today's date: 08/23/2022 Total lbs lost to date: 4 Total lbs lost since last in-office visit: 0  Interim History: Phillip Shannon continues to work on his diet. He is struggling to portion control and make smarter choices.   Subjective:   1. Other fatigue Phillip Shannon notes feeling fatigued. He has questions about how to get more energy.   2. Vitamin D deficiency Phillip Shannon is on Vitamin D, and his last level was low which may contribute to his fatigue.   Assessment/Plan:   1. Other fatigue Labs were reviewed with the patient. We discussed nutrition and exercise to help improve his fatigue.   2. Vitamin D deficiency Phillip Shannon will continue prescription Vitamin D, and we will refill for 1 month.   - Vitamin D, Ergocalciferol, (DRISDOL) 1.25 MG (50000 UNIT) CAPS capsule; Take 1 capsule (50,000 Units total) by mouth every 7 (seven) days.  Dispense: 4 capsule; Refill: 0  3. BMI 38.0-38.9,adult  4. Obesity, Beginning BMI Phillip Shannon is currently in the action stage of change. As such, his goal is to continue with weight loss efforts. He has agreed to change to keeping a food journal and adhering to recommended goals of 1500 calories and 100+ grams of protein daily.   Behavioral modification strategies: increasing lean protein intake and keeping a strict food journal.  Phillip Shannon has agreed to follow-up with our clinic in 4 weeks. He was informed of the importance of frequent follow-up visits to maximize his success with intensive lifestyle modifications for his multiple health conditions.   Objective:    Blood pressure 124/80, pulse 78, temperature 98 F (36.7 C), height 5' 9"$  (1.753 m), weight 262 lb (118.8 kg), SpO2 94 %. Body mass index is 38.69 kg/m.  General: Cooperative, alert, well developed, in no acute distress. HEENT: Conjunctivae and lids unremarkable. Cardiovascular: Regular rhythm.  Lungs: Normal work of breathing. Neurologic: No focal deficits.   Lab Results  Component Value Date   CREATININE 0.9 03/08/2022   BUN 11 03/08/2022   NA 139 03/08/2022   K 4.6 03/08/2022   CL 105 03/08/2022   CO2 29 (A) 03/08/2022   Lab Results  Component Value Date   ALT 11 03/08/2022   AST 15 03/08/2022   ALKPHOS 54 03/08/2022   BILITOT 1.4 (H) 09/16/2020   Lab Results  Component Value Date   HGBA1C 6.0 (H) 09/16/2020   Lab Results  Component Value Date   INSULIN 15.3 09/16/2020   Lab Results  Component Value Date   TSH 1.400 09/16/2020   Lab Results  Component Value Date   CHOL 202 (A) 03/08/2022   HDL 53 03/08/2022   LDLCALC 135 03/08/2022   TRIG 78 03/08/2022   Lab Results  Component Value Date   VD25OH 13.9 (L) 09/16/2020   Lab Results  Component Value Date   WBC 6.3 09/16/2020   HGB 15.3 09/16/2020   HCT 43.9 09/16/2020   MCV 94 09/16/2020   PLT 222 09/16/2020   No results found for: "IRON", "TIBC", "FERRITIN"  Attestation Statements:   Reviewed by clinician on day  of visit: allergies, medications, problem list, medical history, surgical history, family history, social history, and previous encounter notes.   I, Trixie Dredge, am acting as transcriptionist for Dennard Nip, MD.  I have reviewed the above documentation for accuracy and completeness, and I agree with the above. -  Dennard Nip, MD

## 2022-09-15 ENCOUNTER — Other Ambulatory Visit (HOSPITAL_COMMUNITY): Payer: Self-pay

## 2022-09-15 DIAGNOSIS — R197 Diarrhea, unspecified: Secondary | ICD-10-CM | POA: Diagnosis not present

## 2022-09-15 DIAGNOSIS — I4891 Unspecified atrial fibrillation: Secondary | ICD-10-CM | POA: Diagnosis not present

## 2022-09-15 DIAGNOSIS — N529 Male erectile dysfunction, unspecified: Secondary | ICD-10-CM | POA: Diagnosis not present

## 2022-09-15 MED ORDER — TADALAFIL 20 MG PO TABS
20.0000 mg | ORAL_TABLET | Freq: Every day | ORAL | 6 refills | Status: DC | PRN
  Filled 2022-09-15 (×2): qty 15, 15d supply, fill #0

## 2022-09-15 MED ORDER — METOPROLOL SUCCINATE ER 25 MG PO TB24
25.0000 mg | ORAL_TABLET | Freq: Every day | ORAL | 11 refills | Status: DC
  Filled 2022-09-15: qty 30, 30d supply, fill #0

## 2022-10-12 ENCOUNTER — Telehealth: Payer: Self-pay | Admitting: Internal Medicine

## 2022-10-12 NOTE — Telephone Encounter (Signed)
Patient asked that I do his next colonoscopy in [place] [facility] - I had just performed procedures on his wife  He thinks he is "overdue" for colonoscopy  Records search - cannot see colonoscopy in [facility] Last one at [facility] to do his next colonoscopy but we should get records of last exam - it was done at [facility] - he needs to request that they send me a copy - alternatively if he is confident of or can produce a date/record of it otherwise and it is 10+ years ago that will work also and he can be scheduled for screening colonoscopy

## 2022-10-13 NOTE — Telephone Encounter (Signed)
I spoke with Mr [place] and he is going to work on getting his [facility] colonoscopy report faxed to [place]. He was given their phone # and our fax # of [phone] Att: PJ. Once we confirm it's been 10 years we will set up a pre-visit and colonoscopy appointment. He is not diabetic , no BT's, no 02. Uses a CPAP at night.

## 2022-10-18 ENCOUNTER — Other Ambulatory Visit (HOSPITAL_COMMUNITY): Payer: Self-pay

## 2022-10-18 ENCOUNTER — Encounter (INDEPENDENT_AMBULATORY_CARE_PROVIDER_SITE_OTHER): Payer: Self-pay | Admitting: Family Medicine

## 2022-10-18 ENCOUNTER — Ambulatory Visit (INDEPENDENT_AMBULATORY_CARE_PROVIDER_SITE_OTHER): Payer: PPO | Admitting: Family Medicine

## 2022-10-18 VITALS — BP 136/73 | HR 84 | Temp 97.8°F | Ht 69.0 in | Wt 261.0 lb

## 2022-10-18 DIAGNOSIS — Z6838 Body mass index (BMI) 38.0-38.9, adult: Secondary | ICD-10-CM

## 2022-10-18 DIAGNOSIS — E559 Vitamin D deficiency, unspecified: Secondary | ICD-10-CM

## 2022-10-18 DIAGNOSIS — E669 Obesity, unspecified: Secondary | ICD-10-CM

## 2022-10-18 DIAGNOSIS — R7303 Prediabetes: Secondary | ICD-10-CM | POA: Diagnosis not present

## 2022-10-18 MED ORDER — VITAMIN D (ERGOCALCIFEROL) 1.25 MG (50000 UNIT) PO CAPS
50000.0000 [IU] | ORAL_CAPSULE | ORAL | 0 refills | Status: DC
  Filled 2022-10-18: qty 4, 28d supply, fill #0

## 2022-10-18 MED ORDER — METFORMIN HCL 500 MG PO TABS
500.0000 mg | ORAL_TABLET | Freq: Every day | ORAL | 0 refills | Status: DC
Start: 2022-10-18 — End: 2022-11-15
  Filled 2022-10-18: qty 30, 30d supply, fill #0

## 2022-10-18 NOTE — Progress Notes (Unsigned)
Chief Complaint:   OBESITY Phillip Shannon is here to discuss his progress with his obesity treatment plan along with follow-up of his obesity related diagnoses. Phillip Shannon is on keeping a food journal and adhering to recommended goals of 1500 calories and 100+ grams of protein and states he is following his eating plan approximately 50-60% of the time. Phillip Shannon states he is walking 2 times per week.  Today's visit was #: 9 Starting weight: 266 lbs Starting date: 09/16/2020 Today's weight: 261 lbs Today's date: 10/18/2022 Total lbs lost to date: 5 Total lbs lost since last in-office visit: 1  Interim History: Phillip Shannon is working on portion control and Psychologist, counselling. He is working on increasing his protein. He continues to work on portion control and he feels he is improving with this.   Subjective:   1. Prediabetes Phillip Shannon is struggling with weight loss and he has an elevated A1c of 6.0. he is interested in medications to help him lose weight.   2. Vitamin D deficiency Phillip Shannon's last Vitamin D level was very low, and not yet at goal.   Assessment/Plan:   1. Prediabetes Phillip Shannon agreed to start metformin 500 mg q AM with no refills. We will follow-up at his next visit.   - metFORMIN (GLUCOPHAGE) 500 MG tablet; Take 1 tablet (500 mg total) by mouth daily with breakfast.  Dispense: 30 tablet; Refill: 0  2. Vitamin D deficiency Phillip Shannon will continue prescription Vitamin D, and we will refill for 1 month.   - Vitamin D, Ergocalciferol, (DRISDOL) 1.25 MG (50000 UNIT) CAPS capsule; Take 1 capsule (50,000 Units total) by mouth every 7 (seven) days.  Dispense: 4 capsule; Refill: 0  3. BMI 38.0-38.9,adult  4. Obesity, Beginning BMI 39.28 Phillip Shannon is currently in the action stage of change. As such, his goal is to continue with weight loss efforts. He has agreed to keeping a food journal and adhering to recommended goals of 1500 calories and 100+ grams of protein daily.   Exercise goals: As is.    Behavioral modification strategies: increasing lean protein intake and no skipping meals.  Phillip Shannon has agreed to follow-up with our clinic in 3 to 4 weeks. He was informed of the importance of frequent follow-up visits to maximize his success with intensive lifestyle modifications for his multiple health conditions.   Objective:   Blood pressure 136/73, pulse 84, temperature 97.8 F (36.6 C), height 5\' 9"  (1.753 m), weight 261 lb (118.4 kg), SpO2 97 %. Body mass index is 38.54 kg/m.  Lab Results  Component Value Date   CREATININE 0.9 03/08/2022   BUN 11 03/08/2022   NA 139 03/08/2022   K 4.6 03/08/2022   CL 105 03/08/2022   CO2 29 (A) 03/08/2022   Lab Results  Component Value Date   ALT 11 03/08/2022   AST 15 03/08/2022   ALKPHOS 54 03/08/2022   BILITOT 1.4 (H) 09/16/2020   Lab Results  Component Value Date   HGBA1C 6.0 (H) 09/16/2020   Lab Results  Component Value Date   INSULIN 15.3 09/16/2020   Lab Results  Component Value Date   TSH 1.400 09/16/2020   Lab Results  Component Value Date   CHOL 202 (A) 03/08/2022   HDL 53 03/08/2022   LDLCALC 135 03/08/2022   TRIG 78 03/08/2022   Lab Results  Component Value Date   VD25OH 13.9 (L) 09/16/2020   Lab Results  Component Value Date   WBC 6.3 09/16/2020   HGB 15.3 09/16/2020  HCT 43.9 09/16/2020   MCV 94 09/16/2020   PLT 222 09/16/2020   No results found for: "IRON", "TIBC", "FERRITIN"  Attestation Statements:   Reviewed by clinician on day of visit: allergies, medications, problem list, medical history, surgical history, family history, social history, and previous encounter notes.   I, Trixie Dredge, am acting as transcriptionist for Dennard Nip, MD.  I have reviewed the above documentation for accuracy and completeness, and I agree with the above. -  Dennard Nip, MD

## 2022-10-20 NOTE — Telephone Encounter (Signed)
I called [name] to touch base and he hasn't called [facility] yet. He said he will do it now. I gave him the phone # again and our fax #.

## 2022-10-28 NOTE — Telephone Encounter (Signed)
I tried to reach Phillip Shannon and it went to voice mail and his mail box is full.

## 2022-11-14 NOTE — Telephone Encounter (Signed)
I have left [facility] a detailed message as Phillip Shannon said he called [facility] after we last spoke and he requested the records be sent here and she said she would. I left our other fax # of [phone].

## 2022-11-14 NOTE — Progress Notes (Signed)
TeleHealth Visit:  This visit was completed with telemedicine (audio/video) technology. Phillip Shannon has verbally consented to this TeleHealth visit. The patient is located at home, the provider is located at home. The participants in this visit include the listed provider and patient. The visit was conducted today via MyChart video.  OBESITY Phillip Shannon is here to discuss his progress with his obesity treatment plan along with follow-up of his obesity related diagnoses.   Today's visit was # 10 Starting weight: 266 lbs Starting date: 09/16/20 Weight at last in office visit: 261 lbs on 10/18/22 Total weight loss: 5 lbs at last in office visit on 10/18/22. Today's reported weight (11/15/22): none reported  Nutrition Plan: keeping a food journal with goal of 1500 calories and 100 grams of protein daily -  Current exercise:  walking 2 times per week.  Interim History:  He has not been tracking calories.  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 He knows that he has not adhered to the plan as well as he should.  Typical day of food: Breakfast: 1-2 boiled eggs, 2 pieces of toast, OJ 1/2 cup Lunch: Chef salad at [facility] with vinaigrette or [place] salad dressing. Dinner: Airline pilot, green beans, greens at can [employer].  Sometimes has dessert. Snacks: 6 saltines with peanut butter, 1-2 pieces of toast.   Assessment/Plan:  1. Prediabetes Last A1c was 6.0 on 09/16/2020.Marland KitchenMetformin started last OV. He has seen reduction in appetite with the addition of metformin at last office visit.  He had some dizziness but has since resolved.  Still has polyphagia between lunch and dinner. Medication(s): Metformin 500 mg once daily breakfast Lab Results  Component Value Date   HGBA1C 6.0 (H) 09/16/2020   Lab Results  Component Value Date   INSULIN 15.3 09/16/2020    Plan: Continue and increase dose Metformin 500 mg  twice daily with meals.  He will take 1 dose with breakfast and 1 dose with lunch. Check A1c and fasting insulin at next office visit.  2. Vitamin D Deficiency Vitamin D is not at goal of 50.  Most recent vitamin D level was 13.9 on 09/16/2020.Marland Kitchen He is on  prescription ergocalciferol 50,000 IU weekly. Lab Results  Component Value Date   VD25OH 13.9 (L) 09/16/2020    Plan: Continue and refill  prescription ergocalciferol 50,000 IU weekly Check vitamin D level at next office visit.   3. Generalized Obesity: Current BMI 38  Phillip Shannon is currently in the action stage of change. As such, his goal is to continue with weight loss efforts.  He has agreed to the Category 3 plan.  1.  Reemphasized importance of sticking tightly to the plan. 2.  Advised that we will be very hard to lose weight while eating out twice daily. 3.  Suggested he eat lunch at home daily. 4.  Discussed category 3 plan in depth.  Exercise goals:  as is  Behavioral modification strategies: increasing lean protein intake, decreasing simple carbohydrates , decrease eating out, meal planning , and planning for success.  Phillip Shannon has agreed to follow-up with our clinic in 5 weeks with fasting labs.   No orders of the defined types were placed in this encounter.   Medications Discontinued During This Encounter  Medication Reason   Vitamin D, Ergocalciferol, (DRISDOL) 1.25 MG (50000 UNIT) CAPS capsule Reorder   metFORMIN (GLUCOPHAGE) 500 MG tablet Reorder  Meds ordered this encounter  Medications   Vitamin D, Ergocalciferol, (DRISDOL) 1.25 MG (50000 UNIT) CAPS capsule    Sig: Take 1 capsule (50,000 Units total) by mouth every 7 (seven) days.    Dispense:  4 capsule    Refill:  0    Order Specific Question:   Supervising Provider    Answer:   Phillip Shannon   metFORMIN (GLUCOPHAGE) 500 MG tablet    Sig: Take 1 tablet (500 mg total) by mouth 2 (two) times daily with a meal.    Dispense:  60 tablet    Refill:   0    4/30 per epic chat with NP Phillip Shannon, okay to change qty to 60 due to sig change. KKH    Order Specific Question:   Supervising Provider    Answer:   Phillip Shannon [[id number]]      Objective:   VITALS: Per patient if applicable, see vitals. GENERAL: Alert and in no acute distress. CARDIOPULMONARY: No increased WOB. Speaking in clear sentences.  PSYCH: Pleasant and cooperative. Speech normal rate and rhythm. Affect is appropriate. Insight and judgement are appropriate. Attention is focused, linear, and appropriate.  NEURO: Oriented as arrived to appointment on time with no prompting.   Attestation Statements:   Reviewed by clinician on day of visit: allergies, medications, problem list, medical history, surgical history, family history, social history, and previous encounter notes.   This was prepared with the assistance of Engineer, civil (consulting).  Occasional wrong-word or sound-a-like substitutions may have occurred due to the inherent limitations of voice recognition software.

## 2022-11-15 ENCOUNTER — Other Ambulatory Visit (HOSPITAL_COMMUNITY): Payer: Self-pay

## 2022-11-15 ENCOUNTER — Encounter (INDEPENDENT_AMBULATORY_CARE_PROVIDER_SITE_OTHER): Payer: Self-pay | Admitting: Family Medicine

## 2022-11-15 ENCOUNTER — Telehealth (INDEPENDENT_AMBULATORY_CARE_PROVIDER_SITE_OTHER): Payer: PPO | Admitting: Family Medicine

## 2022-11-15 DIAGNOSIS — R7303 Prediabetes: Secondary | ICD-10-CM

## 2022-11-15 DIAGNOSIS — E669 Obesity, unspecified: Secondary | ICD-10-CM

## 2022-11-15 DIAGNOSIS — E559 Vitamin D deficiency, unspecified: Secondary | ICD-10-CM | POA: Diagnosis not present

## 2022-11-15 DIAGNOSIS — Z6838 Body mass index (BMI) 38.0-38.9, adult: Secondary | ICD-10-CM

## 2022-11-15 MED ORDER — VITAMIN D (ERGOCALCIFEROL) 1.25 MG (50000 UNIT) PO CAPS
50000.0000 [IU] | ORAL_CAPSULE | ORAL | 0 refills | Status: AC
  Filled 2022-11-15: qty 4, 28d supply, fill #0

## 2022-11-15 MED ORDER — METFORMIN HCL 500 MG PO TABS
500.0000 mg | ORAL_TABLET | Freq: Two times a day (BID) | ORAL | 0 refills | Status: DC
  Filled 2022-11-15: qty 60, 30d supply, fill #0

## 2022-11-17 NOTE — Telephone Encounter (Signed)
Left [facility] another detailed message to please send [place] the records.

## 2022-11-22 ENCOUNTER — Other Ambulatory Visit (HOSPITAL_COMMUNITY): Payer: Self-pay

## 2022-11-25 NOTE — Telephone Encounter (Signed)
Mr Denmark called me back and he said Phillip Shannon called him and told him to come by their office and sign a ROI. He will do that Monday May 13th.

## 2022-11-25 NOTE — Telephone Encounter (Signed)
Tried to reach Phillip Shannon and his voice mail is full. When we spoke before he said if he needs to he could go by the [place] office and try to get his records. I'm going to ask him to do that. I will try to reach him later.

## 2022-12-03 DIAGNOSIS — M5416 Radiculopathy, lumbar region: Secondary | ICD-10-CM | POA: Diagnosis not present

## 2022-12-05 ENCOUNTER — Other Ambulatory Visit (HOSPITAL_COMMUNITY): Payer: Self-pay

## 2022-12-05 MED ORDER — TRAMADOL HCL 50 MG PO TABS
50.0000 mg | ORAL_TABLET | Freq: Three times a day (TID) | ORAL | 0 refills | Status: DC | PRN
  Filled 2022-12-05: qty 60, 10d supply, fill #0

## 2022-12-09 DIAGNOSIS — M545 Low back pain, unspecified: Secondary | ICD-10-CM | POA: Diagnosis not present

## 2022-12-09 NOTE — Telephone Encounter (Signed)
Spoke to Phillip Shannon. He did go to [place] and sign the release. However I still don't see any records. I then called the medical records at [facility] left a detailed msg. I also sent a verbal consent records release.

## 2022-12-09 NOTE — Telephone Encounter (Signed)
Records received and placed in DR Gessner's in-box for review

## 2022-12-18 ENCOUNTER — Telehealth: Payer: Self-pay | Admitting: Internal Medicine

## 2022-12-18 DIAGNOSIS — Z1211 Encounter for screening for malignant neoplasm of colon: Secondary | ICD-10-CM

## 2022-12-18 NOTE — Telephone Encounter (Signed)
I send colonoscopy 2012 normal  Please arrange screening colonoscopy with me at patient's convenience.  Direct appointment is appropriate from what I see.  Encounter Diagnosis  Name Primary?   Colon cancer screening Yes

## 2022-12-19 ENCOUNTER — Encounter: Payer: Self-pay | Admitting: Internal Medicine

## 2022-12-19 NOTE — Telephone Encounter (Signed)
Mr Denmark set up a pre-visit for 12/29/2022 at 12:30pm and a colonoscopy for 01/05/2023 at 9:00AM with Dr Leone Payor. Paperwork mailed, confirmed address.

## 2022-12-22 ENCOUNTER — Ambulatory Visit (INDEPENDENT_AMBULATORY_CARE_PROVIDER_SITE_OTHER): Payer: PPO | Admitting: Family Medicine

## 2022-12-22 DIAGNOSIS — M545 Low back pain, unspecified: Secondary | ICD-10-CM | POA: Insufficient documentation

## 2022-12-22 DIAGNOSIS — B351 Tinea unguium: Secondary | ICD-10-CM | POA: Insufficient documentation

## 2022-12-22 DIAGNOSIS — N529 Male erectile dysfunction, unspecified: Secondary | ICD-10-CM | POA: Insufficient documentation

## 2022-12-22 DIAGNOSIS — M25562 Pain in left knee: Secondary | ICD-10-CM | POA: Insufficient documentation

## 2022-12-27 ENCOUNTER — Other Ambulatory Visit (HOSPITAL_COMMUNITY): Payer: Self-pay

## 2022-12-27 DIAGNOSIS — M79605 Pain in left leg: Secondary | ICD-10-CM | POA: Diagnosis not present

## 2022-12-27 DIAGNOSIS — M5416 Radiculopathy, lumbar region: Secondary | ICD-10-CM | POA: Diagnosis not present

## 2022-12-27 MED ORDER — HYDROCODONE-ACETAMINOPHEN 5-325 MG PO TABS
1.0000 | ORAL_TABLET | Freq: Four times a day (QID) | ORAL | 0 refills | Status: DC
  Filled 2022-12-27: qty 28, 7d supply, fill #0
  Filled 2022-12-27: qty 60, 15d supply, fill #0

## 2022-12-27 MED ORDER — GABAPENTIN 300 MG PO CAPS
300.0000 mg | ORAL_CAPSULE | Freq: Three times a day (TID) | ORAL | 0 refills | Status: DC
  Filled 2022-12-27: qty 90, 30d supply, fill #0

## 2022-12-29 ENCOUNTER — Encounter: Payer: Self-pay | Admitting: Internal Medicine

## 2022-12-29 ENCOUNTER — Ambulatory Visit (AMBULATORY_SURGERY_CENTER): Payer: PPO

## 2022-12-29 VITALS — Ht 69.0 in | Wt 262.8 lb

## 2022-12-29 DIAGNOSIS — Z1211 Encounter for screening for malignant neoplasm of colon: Secondary | ICD-10-CM

## 2022-12-29 NOTE — Progress Notes (Signed)
No egg or soy allergy known to patient  No issues known to pt with past sedation with any surgeries or procedures: PONV  Patient denies ever being told they had issues or difficulty with intubation  No FH of Malignant Hyperthermia Pt is not on diet pills Pt is not on  home 02  Pt is not on blood thinners  Pt denies issues with constipation  Hx Afib unaware of when he is in or out of rhythm  Have any cardiac testing pending--no  LOA: independent    PV completed with patient. Prep instructions reviewed at visit hard copy given. Pt to purchase all prep products OTC   Patient's chart reviewed by Cathlyn Parsons CNRA prior to previsit and patient appropriate for the [facility].  Previsit completed and red dot placed by patient's name on their procedure day (on provider's schedule).

## 2023-01-04 ENCOUNTER — Telehealth: Payer: Self-pay | Admitting: Internal Medicine

## 2023-01-04 NOTE — Telephone Encounter (Signed)
Returned pts call.  He states that he is going in for a cleaning and repairing a chipped tooth today and believes he is getting anesthesia.  Wants to know if this will be a problem for tomorrow.  Advised him he will be ok to proceed.

## 2023-01-04 NOTE — Telephone Encounter (Signed)
Inbound call from patient wife, wanting to know if the procedure for tomorrow might have to be reschedule , patient is on his way to the dentist for a 9: am appt and she said , they might use anesthesia and she do not know how the anesthesia will affect tomorrows procedure.Please advise

## 2023-01-05 ENCOUNTER — Ambulatory Visit (AMBULATORY_SURGERY_CENTER): Payer: PPO | Admitting: Internal Medicine

## 2023-01-05 ENCOUNTER — Encounter: Payer: Self-pay | Admitting: Internal Medicine

## 2023-01-05 VITALS — BP 113/66 | HR 92 | Temp 97.1°F | Resp 13 | Ht 69.0 in | Wt 262.0 lb

## 2023-01-05 DIAGNOSIS — Z1211 Encounter for screening for malignant neoplasm of colon: Secondary | ICD-10-CM | POA: Diagnosis not present

## 2023-01-05 DIAGNOSIS — G4730 Sleep apnea, unspecified: Secondary | ICD-10-CM | POA: Diagnosis not present

## 2023-01-05 DIAGNOSIS — I480 Paroxysmal atrial fibrillation: Secondary | ICD-10-CM | POA: Diagnosis not present

## 2023-01-05 MED ORDER — SODIUM CHLORIDE 0.9 % IV SOLN
500.0000 mL | Freq: Once | INTRAVENOUS | Status: DC
Start: 2023-01-05 — End: 2023-01-05

## 2023-01-05 NOTE — Progress Notes (Signed)
[facility] History and Physical   Primary Care Physician:  Clovis Riley, L.August Saucer, MD   Reason for Procedure:   CRCA screening  Plan:    colonoscopy     HPI: Phillip Shannon is a 72 y.o. male for screening exam   Past Medical History:  Diagnosis Date   Arthritis    Atrial fibrillation (HCC)    Back pain    Kidney stone    Knee swelling    Paroxysmal atrial fibrillation (HCC)    PONV (postoperative nausea and vomiting)    Sleep apnea     Past Surgical History:  Procedure Laterality Date   BACK SURGERY     CYSTOSCOPY WITH RETROGRADE PYELOGRAM, URETEROSCOPY AND STENT PLACEMENT Right 12/31/2013   Procedure: CYSTOSCOPY WITH RETROGRADE PYELOGRAM, URETEROSCOPY AND STONE REMOVAL;  Surgeon: Kathi Ludwig, MD;  Location: [facility];  Service: Urology;  Laterality: Right;   CYSTOSCOPY WITH RETROGRADE PYELOGRAM, URETEROSCOPY AND STENT PLACEMENT Left 01/27/2015   Procedure: CYSTOSCOPY WITH LEFT RETROGRADE PYELOGRAM, LEFT  SEMI-RIGID AND DIGITAL  URETEROSCOPY AND LEFT  STENT PLACEMENT STONE EXTRACTION;  Surgeon: Sebastian Ache, MD;  Location: [facility];  Service: Urology;  Laterality: Left;   EYE SURGERY Right 07/2013   HOLMIUM LASER APPLICATION Left 01/27/2015   Procedure: HOLMIUM LASER APPLICATION;  Surgeon: Sebastian Ache, MD;  Location: [facility];  Service: Urology;  Laterality: Left;   LITHOTRIPSY      Prior to Admission medications   Medication Sig Start Date End Date Taking? Authorizing Provider  aspirin EC 325 MG tablet Take 325 mg by mouth daily.   Yes [provider]  gabapentin (NEURONTIN) 300 MG capsule Take 1 capsule (300 mg total) by mouth 3 (three) times daily. 12/27/22  Yes   HYDROcodone-acetaminophen (NORCO/VICODIN) 5-325 MG tablet Take 1 tablet by mouth every 6-8 hours. 12/27/22  Yes   metoprolol succinate (TOPROL-XL) 25 MG 24 hr tablet Take 1 tablet (25 mg total) by mouth at bedtime. 09/15/22  Yes   tadalafil (CIALIS) 20 MG tablet Take 1 tablet (20 mg  total) by mouth daily as needed for sexual function 03/28/22  Yes   traMADol (ULTRAM) 50 MG tablet Take 1-2 tablets (50-100 mg total) by mouth 3 (three) times daily as needed for pain. 12/05/22  Yes   Vitamin D, Ergocalciferol, (DRISDOL) 1.25 MG (50000 UNIT) CAPS capsule Take 1 capsule (50,000 Units total) by mouth every 7 (seven) days. 11/15/22  Yes Whitmire, Dawn W, FNP  atorvastatin (LIPITOR) 40 MG tablet Take 1 tablet (40 mg total) by mouth daily. Patient not taking: Reported on 01/05/2023 10/05/21   Runell Gess, MD  diclofenac (VOLTAREN) 75 MG EC tablet Take 1 tablet by mouth 2 times a day with food or  milk as needed for pain 10/26/20     etodolac (LODINE) 400 MG tablet Take 1 tablet (400 mg total) by mouth 2 (two) times daily with food for pain and inflammation Patient not taking: Reported on 12/29/2022 04/28/21     metFORMIN (GLUCOPHAGE) 500 MG tablet Take 1 tablet (500 mg total) by mouth 2 (two) times daily with a meal. 11/15/22   Whitmire, Thermon Leyland, FNP    Current Outpatient Medications  Medication Sig Dispense Refill   aspirin EC 325 MG tablet Take 325 mg by mouth daily.     gabapentin (NEURONTIN) 300 MG capsule Take 1 capsule (300 mg total) by mouth 3 (three) times daily. 90 capsule 0   HYDROcodone-acetaminophen (NORCO/VICODIN) 5-325 MG tablet Take 1 tablet by mouth  every 6-8 hours. 60 tablet 0   metoprolol succinate (TOPROL-XL) 25 MG 24 hr tablet Take 1 tablet (25 mg total) by mouth at bedtime. 30 tablet 11   tadalafil (CIALIS) 20 MG tablet Take 1 tablet (20 mg total) by mouth daily as needed for sexual function 15 tablet 5   traMADol (ULTRAM) 50 MG tablet Take 1-2 tablets (50-100 mg total) by mouth 3 (three) times daily as needed for pain. 60 tablet 0   Vitamin D, Ergocalciferol, (DRISDOL) 1.25 MG (50000 UNIT) CAPS capsule Take 1 capsule (50,000 Units total) by mouth every 7 (seven) days. 4 capsule 0   atorvastatin (LIPITOR) 40 MG tablet Take 1 tablet (40 mg total) by mouth daily.  (Patient not taking: Reported on 01/05/2023) 90 tablet 3   diclofenac (VOLTAREN) 75 MG EC tablet Take 1 tablet by mouth 2 times a day with food or  milk as needed for pain 60 tablet 0   etodolac (LODINE) 400 MG tablet Take 1 tablet (400 mg total) by mouth 2 (two) times daily with food for pain and inflammation (Patient not taking: Reported on 12/29/2022) 60 tablet 0   metFORMIN (GLUCOPHAGE) 500 MG tablet Take 1 tablet (500 mg total) by mouth 2 (two) times daily with a meal. 60 tablet 0   Current Facility-Administered Medications  Medication Dose Route Frequency Provider Last Rate Last Admin   0.9 %  sodium chloride infusion  500 mL Intravenous Once Iva Boop, MD        Allergies as of 01/05/2023 - Review Complete 01/05/2023  Allergen Reaction Noted   Hornet venom Anaphylaxis, Hives, and Swelling 01/27/2015   Codeine Nausea And Vomiting 12/30/2013    Family History  Problem Relation Age of Onset   Alcohol abuse Father    Heart disease Father    Heart disease Sister        aorta   Alcohol abuse Brother    Colon cancer Neg Hx    Colon polyps Neg Hx    Esophageal cancer Neg Hx    Rectal cancer Neg Hx    Stomach cancer Neg Hx     Social History   Socioeconomic History   Marital status: Married    Spouse name: Not on file   Number of children: 2   Years of education: Not on file   Highest education level: Not on file  Occupational History   Occupation: school bus driver  Tobacco Use   Smoking status: Never   Smokeless tobacco: Never  Vaping Use   Vaping Use: Never used  Substance and Sexual Activity   Alcohol use: No   Drug use: No   Sexual activity: Not on file  Other Topics Concern   Not on file  Social History Narrative   Lives with his wife.   Adult children do not live locally.   Social Determinants of Health   Financial Resource Strain: Not on file  Food Insecurity: Not on file  Transportation Needs: Not on file  Physical Activity: Not on file  Stress:  Not on file  Social Connections: Not on file  Intimate Partner Violence: Not on file    Review of Systems:  All other review of systems negative except as mentioned in the HPI.  Physical Exam: Vital signs BP 122/72   Pulse 92   Temp (!) 97.1 F (36.2 C)   Ht 5\' 9"  (1.753 m)   Wt 262 lb (118.8 kg)   SpO2 97%   BMI 38.69 kg/m  General:   Alert,  Well-developed, well-nourished, pleasant and cooperative in NAD Lungs:  Clear throughout to auscultation.   Heart:  Regular rate and Afib rhythm; no murmurs, clicks, rubs,  or gallops. Abdomen:  Soft, nontender and nondistended. Normal bowel sounds.   Neuro/Psych:  Alert and cooperative. Normal mood and affect. A and O x 3   @[place]  Sena Slate, MD, [facility] [phone] (pager) 01/05/2023 9:14 AM@

## 2023-01-05 NOTE — Progress Notes (Signed)
To pacu, VSS. Report to [email or web address] 

## 2023-01-05 NOTE — Patient Instructions (Addendum)
I am pleased to tell you that the colonoscopy was normal - no polyps or cancer were seen.  I appreciate the opportunity to care for you. Iva Boop, MD, [facility]  Resume all of your previous medications today.  Read the handouts given to you by your recovery room nurse.   YOU HAD AN ENDOSCOPIC PROCEDURE TODAY AT THE [facility]:   Refer to the procedure report that was given to you for any specific questions about what was found during the examination.  If the procedure report does not answer your questions, please call your gastroenterologist to clarify.  If you requested that your care partner not be given the details of your procedure findings, then the procedure report has been included in a sealed envelope for you to review at your convenience later.  YOU SHOULD EXPECT: Some feelings of bloating in the abdomen. Passage of more gas than usual.  Walking can help get rid of the air that was put into your GI tract during the procedure and reduce the bloating. If you had a lower endoscopy (such as a colonoscopy or flexible sigmoidoscopy) you may notice spotting of blood in your stool or on the toilet paper. If you underwent a bowel prep for your procedure, you may not have a normal bowel movement for a few days.  Please Note:  You might notice some irritation and congestion in your nose or some drainage.  This is from the oxygen used during your procedure.  There is no need for concern and it should clear up in a day or so.  SYMPTOMS TO REPORT IMMEDIATELY:  Following lower endoscopy (colonoscopy or flexible sigmoidoscopy):  Excessive amounts of blood in the stool  Significant tenderness or worsening of abdominal pains  Swelling of the abdomen that is new, acute  Fever of 100F or higher   For urgent or emergent issues, a gastroenterologist can be reached at any hour by calling [phone]. Do not use MyChart messaging for urgent concerns.    DIET:  We do recommend a small  meal at first, but then you may proceed to your regular diet.  Drink plenty of fluids but you should avoid alcoholic beverages for 24 hours.  ACTIVITY:  You should plan to take it easy for the rest of today and you should NOT DRIVE or use heavy machinery until tomorrow (because of the sedation medicines used during the test).    FOLLOW UP: Our staff will call the number listed on your records the next business day following your procedure.  We will call around 7:15- 8:00 am to check on you and address any questions or concerns that you may have regarding the information given to you following your procedure. If we do not reach you, we will leave a message.      SIGNATURES/CONFIDENTIALITY: You and/or your care partner have signed paperwork which will be entered into your electronic medical record.  These signatures attest to the fact that that the information above on your After Visit Summary has been reviewed and is understood.  Full responsibility of the confidentiality of this discharge information lies with you and/or your care-partner.

## 2023-01-05 NOTE — Progress Notes (Signed)
To pacu, VSS. Report to [email or web address] 

## 2023-01-05 NOTE — Op Note (Signed)
[name] Procedure Date: 01/05/2023 9:21 AM MRN: [id number] Endoscopist: Iva Boop , MD, [id number] Age: 72 Referring MD:  Date of Birth: [birth year] Gender: Male Account #: [id number] Procedure:                Colonoscopy Indications:              Screening for colorectal malignant neoplasm, Last                            colonoscopy: 2012 Medicines:                Monitored Anesthesia Care Procedure:                Pre-Anesthesia Assessment:                           - Prior to the procedure, a History and Physical                            was performed, and patient medications and                            allergies were reviewed. The patient's tolerance of                            previous anesthesia was also reviewed. The risks                            and benefits of the procedure and the sedation                            options and risks were discussed with the patient.                            All questions were answered, and informed consent                            was obtained. Prior Anticoagulants: The patient has                            taken no anticoagulant or antiplatelet agents. ASA                            Grade Assessment: III - A patient with severe                            systemic disease. After reviewing the risks and                            benefits, the patient was deemed in satisfactory                            condition to undergo the procedure.  After obtaining informed consent, the colonoscope                            was passed under direct vision. Throughout the                            procedure, the patient's blood pressure, pulse, and                            oxygen saturations were monitored continuously. The                            CF [id number] #[id number] was introduced through the anus                            and advanced to the the cecum,  identified by                            appendiceal orifice and ileocecal valve. The                            colonoscopy was performed without difficulty. The                            patient tolerated the procedure well. The quality                            of the bowel preparation was good. The ileocecal                            valve, appendiceal orifice, and rectum were                            photographed. The bowel preparation used was                            Miralax via split dose instruction. Scope In: 9:28:17 AM Scope Out: 9:41:29 AM Scope Withdrawal Time: 0 hours 11 minutes 13 seconds  Total Procedure Duration: 0 hours 13 minutes 12 seconds  Findings:                 The perianal and digital rectal examinations were                            normal. Pertinent negatives include normal prostate                            (size, shape, and consistency).                           The entire examined colon appeared normal on direct                            and retroflexion views. Complications:  No immediate complications. Estimated Blood Loss:     Estimated blood loss: none. Impression:               - The entire examined colon is normal on direct and                            retroflexion views.                           - No specimens collected. Recommendation:           - Patient has a contact number available for                            emergencies. The signs and symptoms of potential                            delayed complications were discussed with the                            patient. Return to normal activities tomorrow.                            Written discharge instructions were provided to the                            patient.                           - Resume previous diet.                           - Continue present medications.                           - No repeat colonoscopy due to current age (46                             years or older) and the absence of colonic polyps. Iva Boop, MD 01/05/2023 9:45:38 AM This report has been signed electronically.

## 2023-01-06 ENCOUNTER — Telehealth: Payer: Self-pay

## 2023-01-06 NOTE — Telephone Encounter (Signed)
Post procedure follow up call, no answer 

## 2023-01-09 DIAGNOSIS — M5416 Radiculopathy, lumbar region: Secondary | ICD-10-CM | POA: Diagnosis not present

## 2023-01-10 DIAGNOSIS — Z6837 Body mass index (BMI) 37.0-37.9, adult: Secondary | ICD-10-CM | POA: Diagnosis not present

## 2023-01-10 DIAGNOSIS — M5441 Lumbago with sciatica, right side: Secondary | ICD-10-CM | POA: Diagnosis not present

## 2023-01-11 ENCOUNTER — Other Ambulatory Visit (HOSPITAL_COMMUNITY): Payer: Self-pay

## 2023-01-11 DIAGNOSIS — Z961 Presence of intraocular lens: Secondary | ICD-10-CM | POA: Diagnosis not present

## 2023-01-11 DIAGNOSIS — H524 Presbyopia: Secondary | ICD-10-CM | POA: Diagnosis not present

## 2023-01-11 DIAGNOSIS — H0015 Chalazion left lower eyelid: Secondary | ICD-10-CM | POA: Diagnosis not present

## 2023-01-11 DIAGNOSIS — H43811 Vitreous degeneration, right eye: Secondary | ICD-10-CM | POA: Diagnosis not present

## 2023-01-11 MED ORDER — NEOMYCIN-POLYMYXIN-DEXAMETH 3.5-10000-0.1 OP SUSP
1.0000 [drp] | Freq: Four times a day (QID) | OPHTHALMIC | 1 refills | Status: DC
  Filled 2023-01-11: qty 5, 7d supply, fill #0

## 2023-01-26 DIAGNOSIS — M5416 Radiculopathy, lumbar region: Secondary | ICD-10-CM | POA: Diagnosis not present

## 2023-02-10 ENCOUNTER — Other Ambulatory Visit (HOSPITAL_COMMUNITY): Payer: Self-pay

## 2023-02-10 MED ORDER — HYDROCODONE-ACETAMINOPHEN 5-325 MG PO TABS
1.0000 | ORAL_TABLET | Freq: Four times a day (QID) | ORAL | 0 refills | Status: DC
  Filled 2023-02-10: qty 28, 7d supply, fill #0
  Filled 2023-02-10: qty 60, 15d supply, fill #0

## 2023-02-17 ENCOUNTER — Other Ambulatory Visit (HOSPITAL_COMMUNITY): Payer: Self-pay

## 2023-02-17 MED ORDER — TRAMADOL HCL 50 MG PO TABS
50.0000 mg | ORAL_TABLET | Freq: Three times a day (TID) | ORAL | 0 refills | Status: DC | PRN
  Filled 2023-02-17: qty 60, 10d supply, fill #0

## 2023-02-20 DIAGNOSIS — M5416 Radiculopathy, lumbar region: Secondary | ICD-10-CM | POA: Diagnosis not present

## 2023-03-10 ENCOUNTER — Other Ambulatory Visit (HOSPITAL_COMMUNITY): Payer: Self-pay

## 2023-03-10 DIAGNOSIS — E7800 Pure hypercholesterolemia, unspecified: Secondary | ICD-10-CM | POA: Diagnosis not present

## 2023-03-10 DIAGNOSIS — M545 Low back pain, unspecified: Secondary | ICD-10-CM | POA: Diagnosis not present

## 2023-03-10 DIAGNOSIS — Z125 Encounter for screening for malignant neoplasm of prostate: Secondary | ICD-10-CM | POA: Diagnosis not present

## 2023-03-10 DIAGNOSIS — M8580 Other specified disorders of bone density and structure, unspecified site: Secondary | ICD-10-CM | POA: Diagnosis not present

## 2023-03-10 DIAGNOSIS — Z9989 Dependence on other enabling machines and devices: Secondary | ICD-10-CM | POA: Diagnosis not present

## 2023-03-10 DIAGNOSIS — I4891 Unspecified atrial fibrillation: Secondary | ICD-10-CM | POA: Diagnosis not present

## 2023-03-10 DIAGNOSIS — Z0000 Encounter for general adult medical examination without abnormal findings: Secondary | ICD-10-CM | POA: Diagnosis not present

## 2023-03-10 DIAGNOSIS — N529 Male erectile dysfunction, unspecified: Secondary | ICD-10-CM | POA: Diagnosis not present

## 2023-03-10 LAB — LAB REPORT - SCANNED
A1c: 5.7
EGFR: 93

## 2023-03-10 MED ORDER — TADALAFIL 20 MG PO TABS
20.0000 mg | ORAL_TABLET | Freq: Every day | ORAL | 6 refills | Status: DC | PRN
  Filled 2023-03-10: qty 5, 5d supply, fill #0
  Filled 2023-03-10: qty 15, 15d supply, fill #0
  Filled 2023-03-10: qty 10, 10d supply, fill #0

## 2023-03-24 DIAGNOSIS — R29890 Loss of height: Secondary | ICD-10-CM | POA: Diagnosis not present

## 2023-03-24 DIAGNOSIS — E559 Vitamin D deficiency, unspecified: Secondary | ICD-10-CM | POA: Diagnosis not present

## 2023-05-02 ENCOUNTER — Encounter: Payer: Self-pay | Admitting: Cardiovascular Disease

## 2023-05-02 ENCOUNTER — Ambulatory Visit: Payer: PPO | Attending: Cardiovascular Disease | Admitting: Cardiovascular Disease

## 2023-05-02 VITALS — BP 140/66 | HR 95 | Ht 70.0 in | Wt 252.4 lb

## 2023-05-02 DIAGNOSIS — E782 Mixed hyperlipidemia: Secondary | ICD-10-CM

## 2023-05-02 DIAGNOSIS — I480 Paroxysmal atrial fibrillation: Secondary | ICD-10-CM

## 2023-05-02 DIAGNOSIS — I712 Thoracic aortic aneurysm, without rupture, unspecified: Secondary | ICD-10-CM | POA: Insufficient documentation

## 2023-05-02 DIAGNOSIS — G4733 Obstructive sleep apnea (adult) (pediatric): Secondary | ICD-10-CM

## 2023-05-02 DIAGNOSIS — I7121 Aneurysm of the ascending aorta, without rupture: Secondary | ICD-10-CM

## 2023-05-02 DIAGNOSIS — I4819 Other persistent atrial fibrillation: Secondary | ICD-10-CM | POA: Diagnosis not present

## 2023-05-02 NOTE — Patient Instructions (Signed)
Medication Instructions:  Your physician recommends that you continue on your current medications as directed. Please refer to the Current Medication list given to you today.  *If you need a refill on your cardiac medications before your next appointment, please call your pharmacy*   Lab Work: Your physician recommends that you return for lab work in: 3 months for FASTING Lipid/liver panel  If you have labs (blood work) drawn today and your tests are completely normal, you will receive your results only by: MyChart Message (if you have MyChart) OR A paper copy in the mail If you have any lab test that is abnormal or we need to change your treatment, we will call you to review the results.   Testing/Procedures: Your physician has requested that you have an echocardiogram. Echocardiography is a painless test that uses sound waves to create images of your heart. It provides your doctor with information about the size and shape of your heart and how well your heart's chambers and valves are working. This procedure takes approximately one hour. There are no restrictions for this procedure. Please do NOT wear cologne, perfume, aftershave, or lotions (deodorant is allowed). Please arrive 15 minutes prior to your appointment time. This will take place at [address] [place]    Follow-Up: At [facility], you and your health needs are our priority.  As part of our continuing mission to provide you with exceptional heart care, we have created designated Provider Care Teams.  These Care Teams include your primary Cardiologist (physician) and Advanced Practice Providers (APPs -  Physician Assistants and Nurse Practitioners) who all work together to provide you with the care you need, when you need it.  We recommend signing up for the patient portal called "MyChart".  Sign up information is provided on this After Visit Summary.  MyChart is used to connect with patients for Virtual Visits  (Telemedicine).  Patients are able to view lab/test results, encounter notes, upcoming appointments, etc.  Non-urgent messages can be sent to your provider as well.   To learn more about what you can do with MyChart, go to [email or web address].    Your next appointment:   12 month(s)  Provider:   Nanetta Batty, MD

## 2023-05-02 NOTE — Assessment & Plan Note (Signed)
Small thoracic aortic aneurysm measuring 42 mm by 2D echo 07/17/2017.  This will be repeated.

## 2023-05-02 NOTE — Assessment & Plan Note (Signed)
History of hyperlipidemia on statin therapy which she does not take on a consistent basis.  His most recent lipid profile performed 03/10/2023 revealed total cholesterol 197, LDL 126 and HDL of 59.  Will recheck a lipid profile in 3 months.

## 2023-05-02 NOTE — Progress Notes (Signed)
05/02/2023 [facility]   1951-04-16  [id number]  Primary Physician Clovis Riley, L.August Saucer, MD Primary Cardiologist: Runell Gess MD Nicholes Calamity, [place]  HPI:  Phillip Shannon is a 72 y.o.  mildly overweight married African-American male father of 2 biologic and 2 stepchildren, grandfather to 10 grandchildren referred by Dr. [name] for evaluation of PAF. 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 He has no coronary vessel risk factors. He has never had a heart attack or stroke and denies chest pain or shortness of breath. On routine exam recently Dr. Clovis Riley noted an irregular heartbeat and confirmed PAF on a 12-lead cardiogram. Mr. Lumadue is in A. fib with controlled ventricular response today.The CHA2DSVASC2 score is  1 . He denies chest pain, shortness of breath or dizziness. He did have a recent 2-D echocardiogram that was essentially normal .  He had a sleep study that showed severe sleep apnea and is currently on BiPAP followed by Dr. Tresa Endo with marked improvement in his symptoms.   Since I saw him over a year ago he continues to be in A-fib rate controlled not on oral anticoagulation.  He denies chest pain or shortness of breath.  He is unaware that he is in A-fib..   Current Meds  Medication Sig   aspirin EC 325 MG tablet Take 325 mg by mouth daily.   atorvastatin (LIPITOR) 40 MG tablet Take 1 tablet (40 mg total) by mouth daily.   gabapentin (NEURONTIN) 300 MG capsule Take 1 capsule (300 mg total) by mouth 3 (three) times daily.   HYDROcodone-acetaminophen (NORCO/VICODIN) 5-325 MG tablet Take 1 tablet by mouth every 6 (six) to 8 (eight) hours.   metFORMIN (GLUCOPHAGE) 500 MG tablet Take 1 tablet (500 mg total) by mouth 2 (two) times daily with a meal.   metoprolol succinate (TOPROL-XL) 25 MG 24 hr tablet Take 1 tablet (25 mg total) by mouth at bedtime.   tadalafil (CIALIS) 20 MG tablet Take 1 tablet (20 mg  total) by mouth daily as needed for sexual function   tadalafil (CIALIS) 20 MG tablet Take 1 tablet (20 mg total) by mouth daily as needed for sexual function   traMADol (ULTRAM) 50 MG tablet Take 1-2 tablets (50-100 mg total) by mouth 3 (three) times daily as needed for pain   Vitamin D, Ergocalciferol, (DRISDOL) 1.25 MG (50000 UNIT) CAPS capsule Take 1 capsule (50,000 Units total) by mouth every 7 (seven) days.     Allergies  Allergen Reactions   Hornet Venom Anaphylaxis, Hives and Swelling    Swelling all over    Codeine Nausea And Vomiting    Social History   Socioeconomic History   Marital status: Married    Spouse name: Not on file   Number of children: 2   Years of education: Not on file   Highest education level: Not on file  Occupational History   Occupation: school bus driver  Tobacco Use   Smoking status: Never   Smokeless tobacco: Never  Vaping Use   Vaping status: Never Used  Substance and Sexual Activity   Alcohol use: No   Drug use: No   Sexual activity: Not on file  Other Topics Concern   Not on file  Social History Narrative   Lives with his wife.   Adult children do not live locally.   Social Determinants of Health   Financial Resource Strain: Not on  file  Food Insecurity: Not on file  Transportation Needs: Not on file  Physical Activity: Not on file  Stress: Not on file  Social Connections: Not on file  Intimate Partner Violence: Not on file     Review of Systems: General: negative for chills, fever, night sweats or weight changes.  Cardiovascular: negative for chest pain, dyspnea on exertion, edema, orthopnea, palpitations, paroxysmal nocturnal dyspnea or shortness of breath Dermatological: negative for rash Respiratory: negative for cough or wheezing Urologic: negative for hematuria Abdominal: negative for nausea, vomiting, diarrhea, bright red blood per rectum, melena, or hematemesis Neurologic: negative for visual changes, syncope, or  dizziness All other systems reviewed and are otherwise negative except as noted above.    Blood pressure (!) 140/66, pulse 95, height 5\' 10"  (1.778 m), weight 252 lb 6.4 oz (114.5 kg), SpO2 96%.  General appearance: alert and no distress Neck: no adenopathy, no carotid bruit, no JVD, supple, symmetrical, trachea midline, and thyroid not enlarged, symmetric, no tenderness/mass/nodules Lungs: clear to auscultation bilaterally Heart: irregularly irregular rhythm Extremities: extremities normal, atraumatic, no cyanosis or edema Pulses: 2+ and symmetric Skin: Skin color, texture, turgor normal. No rashes or lesions Neurologic: Grossly normal  EKG EKG Interpretation Date/Time:  Tuesday May 02 2023 11:37:09 EDT Ventricular Rate:  95 PR Interval:    QRS Duration:  94 QT Interval:  346 QTC Calculation: 434 R Axis:   4  Text Interpretation: Atrial fibrillation When compared with ECG of 23-May-2018 08:19, PREVIOUS ECG IS PRESENT Confirmed by Nanetta Batty [phone]) on 05/02/2023 11:57:45 AM    ASSESSMENT AND PLAN:   Atrial fibrillation, persistent (HCC) Persistent A-fib rate controlled not on oral anticoagulation. This patients CHA2DS2-VASc Score and unadjusted Ischemic Stroke Rate (% per year) is equal to 0.6 % stroke rate/year from a score of 1  Above score calculated as 1 point each if present [CHF, HTN, DM, Vascular=MI/PAD/Aortic Plaque, Age if 65-74, or Male] Above score calculated as 2 points each if present [Age > 75, or Stroke/TIA/TE]   Sleep apnea History of obstructive sleep apnea on BiPAP followed by Dr. Tresa Endo  Hyperlipidemia History of hyperlipidemia on statin therapy which she does not take on a consistent basis.  His most recent lipid profile performed 03/10/2023 revealed total cholesterol 197, LDL 126 and HDL of 59.  Will recheck a lipid profile in 3 months.  Thoracic aortic aneurysm (HCC) Small thoracic aortic aneurysm measuring 42 mm by 2D echo 07/17/2017.  This  will be repeated.     Runell Gess MD FACP,FACC,FAHA, [facility] 05/02/2023 12:05 PM

## 2023-05-02 NOTE — Assessment & Plan Note (Signed)
History of obstructive sleep apnea on BiPAP followed by Dr. Tresa Endo

## 2023-05-02 NOTE — Assessment & Plan Note (Signed)
Persistent A-fib rate controlled not on oral anticoagulation. This patients CHA2DS2-VASc Score and unadjusted Ischemic Stroke Rate (% per year) is equal to 0.6 % stroke rate/year from a score of 1  Above score calculated as 1 point each if present [CHF, HTN, DM, Vascular=MI/PAD/Aortic Plaque, Age if 65-74, or Male] Above score calculated as 2 points each if present [Age > 75, or Stroke/TIA/TE]

## 2023-05-16 DIAGNOSIS — R972 Elevated prostate specific antigen [PSA]: Secondary | ICD-10-CM | POA: Diagnosis not present

## 2023-05-16 DIAGNOSIS — E7800 Pure hypercholesterolemia, unspecified: Secondary | ICD-10-CM | POA: Diagnosis not present

## 2023-06-01 ENCOUNTER — Ambulatory Visit (HOSPITAL_COMMUNITY): Payer: PPO | Attending: Cardiovascular Disease

## 2023-06-01 DIAGNOSIS — E782 Mixed hyperlipidemia: Secondary | ICD-10-CM | POA: Diagnosis not present

## 2023-06-01 DIAGNOSIS — I480 Paroxysmal atrial fibrillation: Secondary | ICD-10-CM | POA: Diagnosis not present

## 2023-06-01 DIAGNOSIS — G4733 Obstructive sleep apnea (adult) (pediatric): Secondary | ICD-10-CM | POA: Diagnosis not present

## 2023-06-01 DIAGNOSIS — I4819 Other persistent atrial fibrillation: Secondary | ICD-10-CM | POA: Insufficient documentation

## 2023-06-01 DIAGNOSIS — I7121 Aneurysm of the ascending aorta, without rupture: Secondary | ICD-10-CM | POA: Diagnosis not present

## 2023-06-01 LAB — ECHOCARDIOGRAM COMPLETE
Area-P 1/2: 3.51 cm2
P 1/2 time: 701 ms
S' Lateral: 3.1 cm

## 2023-07-10 DIAGNOSIS — Z20822 Contact with and (suspected) exposure to covid-19: Secondary | ICD-10-CM | POA: Diagnosis not present

## 2023-07-10 DIAGNOSIS — R0981 Nasal congestion: Secondary | ICD-10-CM | POA: Diagnosis not present

## 2023-09-13 ENCOUNTER — Other Ambulatory Visit (HOSPITAL_COMMUNITY): Payer: Self-pay

## 2023-09-13 MED ORDER — ATORVASTATIN CALCIUM 40 MG PO TABS
40.0000 mg | ORAL_TABLET | Freq: Every day | ORAL | 3 refills | Status: AC
  Filled 2023-09-13: qty 90, 90d supply, fill #0

## 2023-09-13 MED ORDER — ZEPBOUND 2.5 MG/0.5ML ~~LOC~~ SOAJ
2.5000 mg | SUBCUTANEOUS | 0 refills | Status: DC
  Filled 2023-09-13: qty 2, 28d supply, fill #0

## 2023-09-13 MED ORDER — METOPROLOL SUCCINATE ER 25 MG PO TB24
25.0000 mg | ORAL_TABLET | Freq: Every day | ORAL | 3 refills | Status: AC
  Filled 2023-09-13: qty 90, 90d supply, fill #0

## 2023-09-14 ENCOUNTER — Other Ambulatory Visit (HOSPITAL_COMMUNITY): Payer: Self-pay

## 2023-09-20 ENCOUNTER — Other Ambulatory Visit (HOSPITAL_COMMUNITY): Payer: Self-pay

## 2023-09-20 MED ORDER — OZEMPIC (0.25 OR 0.5 MG/DOSE) 2 MG/3ML ~~LOC~~ SOPN
0.2500 mg | PEN_INJECTOR | SUBCUTANEOUS | 0 refills | Status: DC
  Filled 2023-09-20: qty 3, 28d supply, fill #0

## 2023-09-21 ENCOUNTER — Other Ambulatory Visit (HOSPITAL_COMMUNITY): Payer: Self-pay

## 2023-09-29 ENCOUNTER — Other Ambulatory Visit (HOSPITAL_COMMUNITY): Payer: Self-pay

## 2023-09-29 MED ORDER — METFORMIN HCL 500 MG PO TABS
500.0000 mg | ORAL_TABLET | Freq: Two times a day (BID) | ORAL | 1 refills | Status: AC
  Filled 2023-09-29: qty 180, 90d supply, fill #0

## 2024-01-08 ENCOUNTER — Other Ambulatory Visit (HOSPITAL_COMMUNITY): Payer: Self-pay

## 2024-01-08 DIAGNOSIS — M5416 Radiculopathy, lumbar region: Secondary | ICD-10-CM | POA: Diagnosis not present

## 2024-01-08 MED ORDER — GABAPENTIN 300 MG PO CAPS
300.0000 mg | ORAL_CAPSULE | Freq: Three times a day (TID) | ORAL | 0 refills | Status: AC
  Filled 2024-01-08: qty 90, 30d supply, fill #0

## 2024-01-16 DIAGNOSIS — M5416 Radiculopathy, lumbar region: Secondary | ICD-10-CM | POA: Diagnosis not present

## 2024-01-17 DIAGNOSIS — H5211 Myopia, right eye: Secondary | ICD-10-CM | POA: Diagnosis not present

## 2024-01-17 DIAGNOSIS — Z961 Presence of intraocular lens: Secondary | ICD-10-CM | POA: Diagnosis not present

## 2024-01-17 DIAGNOSIS — H31002 Unspecified chorioretinal scars, left eye: Secondary | ICD-10-CM | POA: Diagnosis not present

## 2024-01-17 DIAGNOSIS — H43811 Vitreous degeneration, right eye: Secondary | ICD-10-CM | POA: Diagnosis not present

## 2024-01-25 DIAGNOSIS — I7121 Aneurysm of the ascending aorta, without rupture: Secondary | ICD-10-CM | POA: Diagnosis not present

## 2024-01-25 DIAGNOSIS — R351 Nocturia: Secondary | ICD-10-CM | POA: Diagnosis not present

## 2024-01-25 DIAGNOSIS — I4891 Unspecified atrial fibrillation: Secondary | ICD-10-CM | POA: Diagnosis not present

## 2024-01-25 DIAGNOSIS — R7303 Prediabetes: Secondary | ICD-10-CM | POA: Diagnosis not present

## 2024-01-25 DIAGNOSIS — G4733 Obstructive sleep apnea (adult) (pediatric): Secondary | ICD-10-CM | POA: Diagnosis not present

## 2024-01-25 DIAGNOSIS — R350 Frequency of micturition: Secondary | ICD-10-CM | POA: Diagnosis not present

## 2024-01-26 ENCOUNTER — Other Ambulatory Visit (HOSPITAL_COMMUNITY): Payer: Self-pay

## 2024-01-26 MED ORDER — TAMSULOSIN HCL 0.4 MG PO CAPS
0.4000 mg | ORAL_CAPSULE | Freq: Every day | ORAL | 0 refills | Status: DC
  Filled 2024-01-26: qty 30, 30d supply, fill #0

## 2024-02-05 DIAGNOSIS — M47816 Spondylosis without myelopathy or radiculopathy, lumbar region: Secondary | ICD-10-CM | POA: Diagnosis not present

## 2024-02-05 DIAGNOSIS — M5416 Radiculopathy, lumbar region: Secondary | ICD-10-CM | POA: Diagnosis not present

## 2024-02-13 DIAGNOSIS — M47816 Spondylosis without myelopathy or radiculopathy, lumbar region: Secondary | ICD-10-CM | POA: Diagnosis not present

## 2024-03-05 ENCOUNTER — Other Ambulatory Visit (HOSPITAL_COMMUNITY): Payer: Self-pay

## 2024-03-05 DIAGNOSIS — H00015 Hordeolum externum left lower eyelid: Secondary | ICD-10-CM | POA: Diagnosis not present

## 2024-03-05 MED ORDER — DOXYCYCLINE HYCLATE 100 MG PO CAPS
100.0000 mg | ORAL_CAPSULE | Freq: Every day | ORAL | 0 refills | Status: DC
  Filled 2024-03-05: qty 8, 8d supply, fill #0

## 2024-03-05 MED ORDER — NEOMYCIN-POLYMYXIN-DEXAMETH 0.1 % OP OINT
1.0000 | TOPICAL_OINTMENT | Freq: Two times a day (BID) | OPHTHALMIC | 0 refills | Status: AC
  Filled 2024-03-05: qty 3.5, 14d supply, fill #0

## 2024-03-07 DIAGNOSIS — E7800 Pure hypercholesterolemia, unspecified: Secondary | ICD-10-CM | POA: Diagnosis not present

## 2024-03-07 DIAGNOSIS — I4891 Unspecified atrial fibrillation: Secondary | ICD-10-CM | POA: Diagnosis not present

## 2024-03-07 DIAGNOSIS — R972 Elevated prostate specific antigen [PSA]: Secondary | ICD-10-CM | POA: Diagnosis not present

## 2024-03-07 DIAGNOSIS — R7303 Prediabetes: Secondary | ICD-10-CM | POA: Diagnosis not present

## 2024-03-07 LAB — LAB REPORT - SCANNED
A1c: 5.9
EGFR: 91

## 2024-03-11 ENCOUNTER — Ambulatory Visit: Payer: Self-pay | Admitting: Cardiovascular Disease

## 2024-03-12 DIAGNOSIS — I4891 Unspecified atrial fibrillation: Secondary | ICD-10-CM | POA: Diagnosis not present

## 2024-03-12 DIAGNOSIS — R350 Frequency of micturition: Secondary | ICD-10-CM | POA: Diagnosis not present

## 2024-03-12 DIAGNOSIS — E7800 Pure hypercholesterolemia, unspecified: Secondary | ICD-10-CM | POA: Diagnosis not present

## 2024-03-12 DIAGNOSIS — R972 Elevated prostate specific antigen [PSA]: Secondary | ICD-10-CM | POA: Diagnosis not present

## 2024-03-12 DIAGNOSIS — Z0000 Encounter for general adult medical examination without abnormal findings: Secondary | ICD-10-CM | POA: Diagnosis not present

## 2024-03-12 DIAGNOSIS — N529 Male erectile dysfunction, unspecified: Secondary | ICD-10-CM | POA: Diagnosis not present

## 2024-03-12 DIAGNOSIS — M545 Low back pain, unspecified: Secondary | ICD-10-CM | POA: Diagnosis not present

## 2024-03-12 DIAGNOSIS — R7303 Prediabetes: Secondary | ICD-10-CM | POA: Diagnosis not present

## 2024-03-22 ENCOUNTER — Emergency Department (HOSPITAL_COMMUNITY)
Admission: EM | Admit: 2024-03-22 | Discharge: 2024-03-23 | Disposition: A | Discharge status: Home / Self Care | Attending: Emergency Medicine | Admitting: Emergency Medicine

## 2024-03-22 ENCOUNTER — Other Ambulatory Visit: Payer: Self-pay

## 2024-03-22 DIAGNOSIS — R1013 Epigastric pain: Secondary | ICD-10-CM | POA: Diagnosis not present

## 2024-03-22 DIAGNOSIS — Z7982 Long term (current) use of aspirin: Secondary | ICD-10-CM | POA: Insufficient documentation

## 2024-03-22 DIAGNOSIS — R1011 Right upper quadrant pain: Secondary | ICD-10-CM | POA: Diagnosis not present

## 2024-03-22 DIAGNOSIS — R739 Hyperglycemia, unspecified: Secondary | ICD-10-CM | POA: Diagnosis not present

## 2024-03-22 DIAGNOSIS — R1010 Upper abdominal pain, unspecified: Secondary | ICD-10-CM

## 2024-03-22 NOTE — ED Triage Notes (Signed)
 Patient c/o epigastric pain x 4 hour ago. Patient report taking pepto bismol without relief. Patient report nausea, denies vomiting and diarrhea. Patient denies chest pain and SOB.

## 2024-03-23 ENCOUNTER — Emergency Department (HOSPITAL_COMMUNITY)

## 2024-03-23 ENCOUNTER — Observation Stay (HOSPITAL_COMMUNITY)
Admission: EM | Admit: 2024-03-23 | Discharge: 2024-03-25 | Disposition: A | Discharge status: Home / Self Care | Attending: Internal Medicine | Admitting: Internal Medicine

## 2024-03-23 DIAGNOSIS — E669 Obesity, unspecified: Secondary | ICD-10-CM | POA: Diagnosis not present

## 2024-03-23 DIAGNOSIS — G4733 Obstructive sleep apnea (adult) (pediatric): Secondary | ICD-10-CM | POA: Insufficient documentation

## 2024-03-23 DIAGNOSIS — I4819 Other persistent atrial fibrillation: Secondary | ICD-10-CM | POA: Diagnosis present

## 2024-03-23 DIAGNOSIS — M545 Low back pain, unspecified: Secondary | ICD-10-CM | POA: Diagnosis present

## 2024-03-23 DIAGNOSIS — R7303 Prediabetes: Secondary | ICD-10-CM | POA: Diagnosis present

## 2024-03-23 DIAGNOSIS — K56609 Unspecified intestinal obstruction, unspecified as to partial versus complete obstruction: Secondary | ICD-10-CM

## 2024-03-23 DIAGNOSIS — K56600 Partial intestinal obstruction, unspecified as to cause: Principal | ICD-10-CM | POA: Diagnosis present

## 2024-03-23 DIAGNOSIS — K5669 Other partial intestinal obstruction: Secondary | ICD-10-CM | POA: Diagnosis not present

## 2024-03-23 DIAGNOSIS — E785 Hyperlipidemia, unspecified: Secondary | ICD-10-CM | POA: Diagnosis not present

## 2024-03-23 DIAGNOSIS — Z79899 Other long term (current) drug therapy: Secondary | ICD-10-CM | POA: Diagnosis not present

## 2024-03-23 DIAGNOSIS — Z7982 Long term (current) use of aspirin: Secondary | ICD-10-CM | POA: Insufficient documentation

## 2024-03-23 DIAGNOSIS — R972 Elevated prostate specific antigen [PSA]: Secondary | ICD-10-CM | POA: Insufficient documentation

## 2024-03-23 DIAGNOSIS — N281 Cyst of kidney, acquired: Secondary | ICD-10-CM | POA: Diagnosis not present

## 2024-03-23 DIAGNOSIS — R109 Unspecified abdominal pain: Secondary | ICD-10-CM | POA: Diagnosis not present

## 2024-03-23 DIAGNOSIS — R112 Nausea with vomiting, unspecified: Secondary | ICD-10-CM | POA: Diagnosis not present

## 2024-03-23 DIAGNOSIS — R1084 Generalized abdominal pain: Secondary | ICD-10-CM | POA: Diagnosis not present

## 2024-03-23 DIAGNOSIS — Z7984 Long term (current) use of oral hypoglycemic drugs: Secondary | ICD-10-CM | POA: Diagnosis not present

## 2024-03-23 DIAGNOSIS — R079 Chest pain, unspecified: Secondary | ICD-10-CM | POA: Diagnosis not present

## 2024-03-23 DIAGNOSIS — I7121 Aneurysm of the ascending aorta, without rupture: Secondary | ICD-10-CM | POA: Diagnosis not present

## 2024-03-23 DIAGNOSIS — G4730 Sleep apnea, unspecified: Secondary | ICD-10-CM | POA: Diagnosis present

## 2024-03-23 DIAGNOSIS — I712 Thoracic aortic aneurysm, without rupture, unspecified: Secondary | ICD-10-CM | POA: Diagnosis not present

## 2024-03-23 DIAGNOSIS — R1011 Right upper quadrant pain: Secondary | ICD-10-CM | POA: Diagnosis not present

## 2024-03-23 LAB — CBC
HCT: 41.6 % (ref 39.0–52.0)
HCT: 43.7 % (ref 39.0–52.0)
Hemoglobin: 14.2 g/dL (ref 13.0–17.0)
Hemoglobin: 15 g/dL (ref 13.0–17.0)
MCH: 32.5 pg (ref 26.0–34.0)
MCH: 32.6 pg (ref 26.0–34.0)
MCHC: 34.1 g/dL (ref 30.0–36.0)
MCHC: 34.3 g/dL (ref 30.0–36.0)
MCV: 94.6 fL (ref 80.0–100.0)
MCV: 95.6 fL (ref 80.0–100.0)
Platelets: 211 K/uL (ref 150–400)
Platelets: 224 K/uL (ref 150–400)
RBC: 4.35 MIL/uL (ref 4.22–5.81)
RBC: 4.62 MIL/uL (ref 4.22–5.81)
RDW: 13.9 % (ref 11.5–15.5)
RDW: 13.9 % (ref 11.5–15.5)
WBC: 10.2 K/uL (ref 4.0–10.5)
WBC: 7.5 K/uL (ref 4.0–10.5)
nRBC: 0 % (ref 0.0–0.2)
nRBC: 0 % (ref 0.0–0.2)

## 2024-03-23 LAB — COMPREHENSIVE METABOLIC PANEL WITH GFR
ALT: 19 U/L (ref 0–44)
ALT: 19 U/L (ref 0–44)
AST: 21 U/L (ref 15–41)
AST: 22 U/L (ref 15–41)
Albumin: 4.1 g/dL (ref 3.5–5.0)
Albumin: 4.2 g/dL (ref 3.5–5.0)
Alkaline Phosphatase: 71 U/L (ref 38–126)
Alkaline Phosphatase: 77 U/L (ref 38–126)
Anion gap: 12 (ref 5–15)
Anion gap: 16 — ABNORMAL HIGH (ref 5–15)
BUN: 10 mg/dL (ref 8–23)
BUN: 8 mg/dL (ref 8–23)
CO2: 25 mmol/L (ref 22–32)
CO2: 22 mmol/L (ref 22–32)
Calcium: 10 mg/dL (ref 8.9–10.3)
Calcium: 9.9 mg/dL (ref 8.9–10.3)
Chloride: 104 mmol/L (ref 98–111)
Chloride: 102 mmol/L (ref 98–111)
Creatinine, Ser: 0.85 mg/dL (ref 0.61–1.24)
Creatinine, Ser: 0.76 mg/dL (ref 0.61–1.24)
GFR, Estimated: >60 mL/min (ref >60)
GFR, Estimated: >60 mL/min (ref >60)
Glucose, Bld: 104 mg/dL — ABNORMAL HIGH (ref 70–99)
Glucose, Bld: 114 mg/dL — ABNORMAL HIGH (ref 70–99)
Potassium: 4.2 mmol/L (ref 3.5–5.1)
Potassium: 4.3 mmol/L (ref 3.5–5.1)
Sodium: 141 mmol/L (ref 135–145)
Sodium: 140 mmol/L (ref 135–145)
Total Bilirubin: 1.5 mg/dL — ABNORMAL HIGH (ref 0.0–1.2)
Total Bilirubin: 2.1 mg/dL — ABNORMAL HIGH (ref 0.0–1.2)
Total Protein: 6.8 g/dL (ref 6.5–8.1)
Total Protein: 7.1 g/dL (ref 6.5–8.1)

## 2024-03-23 LAB — URINALYSIS, ROUTINE W REFLEX MICROSCOPIC
Bilirubin Urine: NEGATIVE
Bilirubin Urine: NEGATIVE
Glucose, UA: NEGATIVE mg/dL
Glucose, UA: NEGATIVE mg/dL
Hgb urine dipstick: NEGATIVE
Hgb urine dipstick: NEGATIVE
Ketones, ur: NEGATIVE mg/dL
Ketones, ur: 5 mg/dL — AB
Leukocytes,Ua: NEGATIVE
Leukocytes,Ua: NEGATIVE
Nitrite: NEGATIVE
Nitrite: NEGATIVE
Protein, ur: NEGATIVE mg/dL
Protein, ur: NEGATIVE mg/dL
Specific Gravity, Urine: 1.013 (ref 1.005–1.030)
Specific Gravity, Urine: 1.013 (ref 1.005–1.030)
pH: 5 (ref 5.0–8.0)
pH: 8 (ref 5.0–8.0)

## 2024-03-23 LAB — LIPASE, BLOOD
Lipase: 23 U/L (ref 11–51)
Lipase: 21 U/L (ref 11–51)

## 2024-03-23 LAB — LACTIC ACID, PLASMA
Lactic Acid, Venous: 1.8 mmol/L (ref 0.5–1.9)
Lactic Acid, Venous: 1.6 mmol/L (ref 0.5–1.9)

## 2024-03-23 LAB — TROPONIN T, HIGH SENSITIVITY
Troponin T High Sensitivity: <15 ng/L (ref 0–19)
Troponin T High Sensitivity: <15 ng/L (ref 0–19)

## 2024-03-23 LAB — PHOSPHORUS: Phosphorus: 2.9 mg/dL (ref 2.5–4.6)

## 2024-03-23 LAB — MAGNESIUM: Magnesium: 2 mg/dL (ref 1.7–2.4)

## 2024-03-23 MED ORDER — ONDANSETRON HCL 4 MG/2ML IJ SOLN
4.0000 mg | Freq: Once | INTRAMUSCULAR | Status: AC
  Administered 2024-03-23: 4 mg via INTRAVENOUS
  Filled 2024-03-23: qty 2

## 2024-03-23 MED ORDER — HYDROMORPHONE HCL 1 MG/ML IJ SOLN
0.5000 mg | INTRAMUSCULAR | Status: DC | PRN
  Administered 2024-03-23: 0.5 mg via INTRAVENOUS
  Filled 2024-03-23: qty 1

## 2024-03-23 MED ORDER — SODIUM CHLORIDE 0.9 % IV BOLUS
1000.0000 mL | Freq: Once | INTRAVENOUS | Status: AC
  Administered 2024-03-23: 1000 mL via INTRAVENOUS

## 2024-03-23 MED ORDER — ENOXAPARIN SODIUM 40 MG/0.4ML IJ SOSY
40.0000 mg | PREFILLED_SYRINGE | INTRAMUSCULAR | Status: DC
  Administered 2024-03-23 – 2024-03-24 (×2): 40 mg via SUBCUTANEOUS
  Filled 2024-03-23 (×2): qty 0.4

## 2024-03-23 MED ORDER — METOPROLOL TARTRATE 5 MG/5ML IV SOLN
5.0000 mg | Freq: Two times a day (BID) | INTRAVENOUS | Status: AC
  Administered 2024-03-23 – 2024-03-24 (×3): 5 mg via INTRAVENOUS
  Filled 2024-03-23 (×3): qty 5

## 2024-03-23 MED ORDER — ONDANSETRON 4 MG PO TBDP
4.0000 mg | ORAL_TABLET | Freq: Three times a day (TID) | ORAL | 0 refills | Status: AC | PRN
  Filled 2024-03-23: qty 15, 5d supply, fill #0

## 2024-03-23 MED ORDER — PANTOPRAZOLE SODIUM 40 MG IV SOLR
40.0000 mg | Freq: Once | INTRAVENOUS | Status: AC
  Administered 2024-03-23: 40 mg via INTRAVENOUS
  Filled 2024-03-23: qty 10

## 2024-03-23 MED ORDER — HYDROMORPHONE HCL 1 MG/ML IJ SOLN
1.0000 mg | Freq: Once | INTRAMUSCULAR | Status: AC
  Administered 2024-03-23: 1 mg via INTRAVENOUS
  Filled 2024-03-23: qty 1

## 2024-03-23 MED ORDER — ONDANSETRON HCL 4 MG/2ML IJ SOLN: 4.0000 mg | Freq: Four times a day (QID) | INTRAMUSCULAR | Status: DC | PRN

## 2024-03-23 MED ORDER — SODIUM CHLORIDE 0.9 % IV SOLN: INTRAVENOUS | Status: AC

## 2024-03-23 MED ORDER — HYDROMORPHONE HCL 1 MG/ML IJ SOLN: 0.5000 mg | INTRAMUSCULAR | Status: DC | PRN

## 2024-03-23 MED ORDER — ACETAMINOPHEN 325 MG PO TABS: 650.0000 mg | ORAL_TABLET | Freq: Four times a day (QID) | ORAL | Status: DC | PRN

## 2024-03-23 MED ORDER — ACETAMINOPHEN 650 MG RE SUPP: 650.0000 mg | Freq: Four times a day (QID) | RECTAL | Status: DC | PRN

## 2024-03-23 MED ORDER — IOHEXOL 350 MG/ML SOLN
100.0000 mL | Freq: Once | INTRAVENOUS | Status: AC | PRN
  Administered 2024-03-23: 100 mL via INTRAVENOUS

## 2024-03-23 NOTE — ED Triage Notes (Signed)
 Patient in today reporting severe upper abd pain, nausea, vomiting that started days ago.

## 2024-03-23 NOTE — Consult Note (Addendum)
 CC/Reason for consult: Small bowel obstruction  Requesting physician: Lamar Salen, MD  HPI: Phillip Shannon is an 73 y.o. male with hx of DM, HTN, HLD, OSA, afib, BPH, kidney stones here with abdominal discomfort and bloating that began yesterday. Associated n/v. More discomfort in upper than lower abdomen. No radiation. Never had this before. No sick contacts. Underwent workup last night but was discharged after US  was normal. Returned to ER with ongoing concerns. Underwent workup and was found to have possible partial/early small bowel obstruction.  Currently, reports minimal discomfort. He reports no nausea or vomiting since earlier this morning. No flatus/BM in last ~2 days.  PSH: Denies any prior abdominal/pelvic surgical history  Wife present at bedside.  Dr. Celinda present at bedside.  Past Medical History:  Diagnosis Date   Arthritis    Atrial fibrillation (HCC)    Back pain    Kidney stone    Knee swelling    Paroxysmal atrial fibrillation (HCC)    PONV (postoperative nausea and vomiting)    Sleep apnea     Past Surgical History:  Procedure Laterality Date   BACK SURGERY     CYSTOSCOPY WITH RETROGRADE PYELOGRAM, URETEROSCOPY AND STENT PLACEMENT Right 12/31/2013   Procedure: CYSTOSCOPY WITH RETROGRADE PYELOGRAM, URETEROSCOPY AND STONE REMOVAL;  Surgeon: Arlena LILLETTE Gal, MD;  Location: [facility];  Service: Urology;  Laterality: Right;   CYSTOSCOPY WITH RETROGRADE PYELOGRAM, URETEROSCOPY AND STENT PLACEMENT Left 01/27/2015   Procedure: CYSTOSCOPY WITH LEFT RETROGRADE PYELOGRAM, LEFT  SEMI-RIGID AND DIGITAL  URETEROSCOPY AND LEFT  STENT PLACEMENT STONE EXTRACTION;  Surgeon: Ricardo Likens, MD;  Location: [facility];  Service: Urology;  Laterality: Left;   EYE SURGERY Right 07/2013   HOLMIUM LASER APPLICATION Left 01/27/2015   Procedure: HOLMIUM LASER APPLICATION;  Surgeon: Ricardo Likens, MD;  Location: [facility];  Service: Urology;  Laterality: Left;   LITHOTRIPSY       Family History  Problem Relation Age of Onset   Alcohol abuse Father    Heart disease Father    Heart disease Sister        aorta   Alcohol abuse Brother    Colon cancer Neg Hx    Colon polyps Neg Hx    Esophageal cancer Neg Hx    Rectal cancer Neg Hx    Stomach cancer Neg Hx     Social:  reports that he has never smoked. He has never used smokeless tobacco. He reports that he does not drink alcohol and does not use drugs.  Allergies:  Allergies  Allergen Reactions   Hornet Venom Anaphylaxis, Hives and Swelling    Swelling all over    Codeine Nausea And Vomiting    Medications: I have reviewed the patient's current medications.  Results for orders placed or performed during the hospital encounter of 03/23/24 (from the past 48 hours)  Urinalysis, Routine w reflex microscopic -Urine, Clean Catch     Status: Abnormal   Collection Time: 03/23/24 11:42 AM  Result Value Ref Range   Color, Urine YELLOW YELLOW   APPearance CLEAR CLEAR   Specific Gravity, Urine 1.013 1.005 - 1.030   pH 8.0 5.0 - 8.0   Glucose, UA NEGATIVE NEGATIVE mg/dL   Hgb urine dipstick NEGATIVE NEGATIVE   Bilirubin Urine NEGATIVE NEGATIVE   Ketones, ur 5 (A) NEGATIVE mg/dL   Protein, ur NEGATIVE NEGATIVE mg/dL   Nitrite NEGATIVE NEGATIVE   Leukocytes,Ua NEGATIVE NEGATIVE    Comment: Performed at [facility], [address] [place], [place] [address]  CBC     Status: None   Collection Time: 03/23/24  2:00 PM  Result Value Ref Range   WBC 10.2 4.0 - 10.5 K/uL   RBC 4.62 4.22 - 5.81 MIL/uL   Hemoglobin 15.0 13.0 - 17.0 g/dL   HCT 56.2 60.9 - 47.9 %   MCV 94.6 80.0 - 100.0 fL   MCH 32.5 26.0 - 34.0 pg   MCHC 34.3 30.0 - 36.0 g/dL   RDW 86.0 88.4 - 84.4 %   Platelets 224 150 - 400 K/uL   nRBC 0.0 0.0 - 0.2 %    Comment: Performed at [facility], [address], [place], [place] [address]  Troponin T, High Sensitivity     Status: None   Collection Time:  03/23/24  2:00 PM  Result Value Ref Range   Troponin T High Sensitivity <15 0 - 19 ng/L    Comment: (NOTE) Biotin concentrations > 1000 ng/mL falsely decrease TnT results.  Serial cardiac troponin measurements are suggested.  Refer to the Links section for chest pain algorithms and additional  guidance. Performed at [facility], [address], [place], [place] [address]     CT Angio Chest/Abd/Pel for Dissection W and/or Wo Contrast Result Date: 03/23/2024 CLINICAL DATA:  Acute aortic syndrome suspected. Severe abdominal pain with nausea vomiting. Hypertension. Clinical concern for dissection versus ischemic bowel. EXAM: CT ANGIOGRAPHY CHEST, ABDOMEN AND PELVIS TECHNIQUE: Non-contrast CT of the chest was initially obtained. Multidetector CT imaging through the chest, abdomen and pelvis was performed using the standard protocol during bolus administration of intravenous contrast. Multiplanar reconstructed images and MIPs were obtained and reviewed to evaluate the vascular anatomy. RADIATION DOSE REDUCTION: This exam was performed according to the departmental dose-optimization program which includes automated exposure control, adjustment of the mA and/or kV according to patient size and/or use of iterative reconstruction technique. CONTRAST:  OMNIPAQUE  IOHEXOL  350 MG/ML SOLN COMPARISON:  01/27/2015. FINDINGS: CTA CHEST FINDINGS Cardiovascular: The heart is borderline enlarged a small pericardial effusion is noted. There is aneurysmal dilatation of the ascending aorta measuring 4.4 cm. No dissection is seen. The pulmonary trunk is distended suggesting underlying pulmonary artery hypertension. Mediastinum/Nodes: No mediastinal, hilar, or axillary lymphadenopathy is seen. The thyroid  gland, trachea, and esophagus are within normal limits. There is a small hiatal hernia. Lungs/Pleura: Atelectasis is present bilaterally. No effusion or pneumothorax is seen. No significant pulmonary  nodule or mass is seen. Musculoskeletal: Degenerative changes are present in the thoracic spine. No acute osseous abnormality is seen. Review of the MIP images confirms the above findings. CTA ABDOMEN AND PELVIS FINDINGS VASCULAR Aorta: Normal caliber aorta without aneurysm, dissection, vasculitis or significant stenosis. Aortic atherosclerosis. Celiac: Patent without evidence of dissection, vasculitis or significant stenosis. There is mild enlargement of the proximal celiac artery with no evidence stenosis. SMA: Patent without evidence of aneurysm, dissection, vasculitis or significant stenosis. Renals: Both renal arteries are patent without evidence of aneurysm, dissection, vasculitis, fibromuscular dysplasia or significant stenosis. Accessory renal arteries are noted bilaterally. IMA: Patent without evidence of aneurysm, dissection, vasculitis or significant stenosis. Inflow: Patent without evidence of aneurysm, dissection, vasculitis or significant stenosis. Veins: No obvious venous abnormality within the limitations of this arterial phase study. Review of the MIP images confirms the above findings. NON-VASCULAR Hepatobiliary: No focal liver abnormality is seen. No gallstones, gallbladder wall thickening, or biliary dilatation. Pancreas: Unremarkable. No pancreatic ductal dilatation or surrounding inflammatory changes. Spleen: Normal in  size without focal abnormality. Adrenals/Urinary Tract: There is a 1.7 cm nodule in the right adrenal gland with attenuation of 0 Hounsfield units on noncontrast exam, compatible with adenoma. No adnexal mass on the left. Cysts are present within the kidneys bilaterally. The kidneys enhance symmetrically. Nonobstructive renal calculi are noted on the left. There is no hydronephrosis bilaterally. The bladder is unremarkable. Stomach/Bowel: There is a small hiatal hernia. The stomach is otherwise within normal limits. A few prominent fluid-filled loops of bowel are noted in the  abdomen measuring up to 3.5 cm. There is a suspected transition point in the mid left abdomen, best seen on coronal image 9 and sagittal image 190. No pneumatosis or free air is seen. Appendix appears normal. Lymphatic: No abdominal or pelvic lymphadenopathy is seen. Reproductive: The prostate gland is enlarged. Other: There is a trace amount of free fluid in the pelvis. A fat containing umbilical hernia is present. 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 No acute osseous abnormality is seen. Review of the MIP images confirms the above findings. IMPRESSION: 1. Aortic atherosclerosis without evidence of dissection. 2. Dilated loops of small bowel in the abdomen with a suspected trace shin point in the left lower quadrant, concerning for partial or early small bowel obstruction. 3. 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 Recommend annual imaging followup by CTA or MRA. This recommendation follows 2010 ACCF/AHA/AATS/ACR/ASA/SCA/SCAI/SIR/STS/SVM Guidelines for the Diagnosis and Management of Patients with Thoracic Aortic Disease. Circulation. 2010; 121: Z733-z630. Aortic aneurysm NOS (ICD10-I71.9) 4. Distended pulmonary trunk suggesting underlying pulmonary artery hypertension. 5. Small hiatal hernia. 6. Nonobstructive left renal calculus. 7. Enlarged prostate gland. Critical Value/emergent results were called by telephone at the time of interpretation on 03/23/2024 at 3:13 pm to provider [facility] , who verbally acknowledged these results. Electronically Signed   By: Leita Birmingham M.D.   On: 03/23/2024 15:13   US  Abdomen Limited RUQ (LIVER/GB) Result Date: 03/23/2024 EXAM: Right Upper Quadrant Abdominal Ultrasound TECHNIQUE: Real-time ultrasonography of the right upper quadrant of the abdomen was performed. COMPARISON: Comparison with CT 01/27/2015. CLINICAL HISTORY: RUQ abdominal pain.  FINDINGS: LIVER: The liver demonstrates normal echogenicity. No intrahepatic biliary ductal dilatation. No mass. BILIARY SYSTEM: No evidence of pericholecystic fluid or wall thickening. No cholelithiasis. Negative sonographic Murphy's sign. Common bile duct is within normal limits measuring 5 mm. RIGHT KIDNEY: The right kidney contains a 6 cm renal cyst without internal vascularity. No follow-up recommended. OTHER: No right upper quadrant ascites. IMPRESSION: 1. No evidence of cholelithiasis or other acute abnormality in the right upper quadrant. Electronically signed by: Norman Gatlin MD 03/23/2024 02:46 AM EDT RP Workstation: [id number]    ROS - all of the below systems have been reviewed with the patient and positives are indicated with bold text General: chills, fever or night sweats Eyes: blurry vision or double vision ENT: epistaxis or sore throat Allergy/Immunology: itchy/watery eyes or nasal congestion Hematologic/Lymphatic: bleeding problems, blood clots or swollen lymph nodes Endocrine: temperature intolerance or unexpected weight changes Breast: new or changing breast lumps or nipple discharge Resp: cough, shortness of breath, or wheezing CV: chest pain or dyspnea on exertion GI: as per HPI GU: dysuria, trouble voiding, or hematuria MSK: joint pain or joint stiffness Neuro: TIA or stroke symptoms Derm: pruritus and skin lesion changes Psych: anxiety and depression  PE Blood pressure (!) 153/99, pulse 88, temperature 98.5 F (36.9 C), temperature source Oral, resp.  rate 20, SpO2 95%. Constitutional: NAD; conversant; no deformities Eyes: Moist conjunctiva; no lid lag; anicteric; PERRL Neck: Trachea midline; no thyromegaly Lungs: Normal respiratory effort; no tactile fremitus CV: RRR; no palpable thrills; no pitting edema GI: Abd obese, soft, mildly distended, nontender throughout; no palpable hepatosplenomegaly MSK: Normal range of motion of extremities; no  clubbing/cyanosis Psychiatric: Appropriate affect; alert and oriented x3 Lymphatic: No palpable cervical or axillary lymphadenopathy  Results for orders placed or performed during the hospital encounter of 03/23/24 (from the past 48 hours)  Urinalysis, Routine w reflex microscopic -Urine, Clean Catch     Status: Abnormal   Collection Time: 03/23/24 11:42 AM  Result Value Ref Range   Color, Urine YELLOW YELLOW   APPearance CLEAR CLEAR   Specific Gravity, Urine 1.013 1.005 - 1.030   pH 8.0 5.0 - 8.0   Glucose, UA NEGATIVE NEGATIVE mg/dL   Hgb urine dipstick NEGATIVE NEGATIVE   Bilirubin Urine NEGATIVE NEGATIVE   Ketones, ur 5 (A) NEGATIVE mg/dL   Protein, ur NEGATIVE NEGATIVE mg/dL   Nitrite NEGATIVE NEGATIVE   Leukocytes,Ua NEGATIVE NEGATIVE    Comment: Performed at [facility], [address] [place], [place] [address]  CBC     Status: None   Collection Time: 03/23/24  2:00 PM  Result Value Ref Range   WBC 10.2 4.0 - 10.5 K/uL   RBC 4.62 4.22 - 5.81 MIL/uL   Hemoglobin 15.0 13.0 - 17.0 g/dL   HCT 56.2 60.9 - 47.9 %   MCV 94.6 80.0 - 100.0 fL   MCH 32.5 26.0 - 34.0 pg   MCHC 34.3 30.0 - 36.0 g/dL   RDW 86.0 88.4 - 84.4 %   Platelets 224 150 - 400 K/uL   nRBC 0.0 0.0 - 0.2 %    Comment: Performed at [facility], [address] [place], [place] [address]  Troponin T, High Sensitivity     Status: None   Collection Time: 03/23/24  2:00 PM  Result Value Ref Range   Troponin T High Sensitivity <15 0 - 19 ng/L    Comment: (NOTE) Biotin concentrations > 1000 ng/mL falsely decrease TnT results.  Serial cardiac troponin measurements are suggested.  Refer to the Links section for chest pain algorithms and additional  guidance. Performed at [facility], [address] [place], [place] [address]     CT Angio Chest/Abd/Pel for Dissection W and/or Wo Contrast Result Date: 03/23/2024 CLINICAL DATA:  Acute aortic syndrome  suspected. Severe abdominal pain with nausea vomiting. Hypertension. Clinical concern for dissection versus ischemic bowel. EXAM: CT ANGIOGRAPHY CHEST, ABDOMEN AND PELVIS TECHNIQUE: Non-contrast CT of the chest was initially obtained. Multidetector CT imaging through the chest, abdomen and pelvis was performed using the standard protocol during bolus administration of intravenous contrast. Multiplanar reconstructed images and MIPs were obtained and reviewed to evaluate the vascular anatomy. RADIATION DOSE REDUCTION: This exam was performed according to the departmental dose-optimization program which includes automated exposure control, adjustment of the mA and/or kV according to patient size and/or use of iterative reconstruction technique. CONTRAST:  OMNIPAQUE  IOHEXOL  350 MG/ML SOLN COMPARISON:  01/27/2015. FINDINGS: CTA CHEST FINDINGS Cardiovascular: The heart is borderline enlarged a small pericardial effusion is noted. There is aneurysmal dilatation of the ascending aorta measuring 4.4 cm. No dissection is seen. The pulmonary trunk is distended suggesting underlying pulmonary artery hypertension. Mediastinum/Nodes: No mediastinal, hilar, or axillary lymphadenopathy is seen. The thyroid  gland, trachea, and esophagus are within normal  limits. There is a small hiatal hernia. Lungs/Pleura: Atelectasis is present bilaterally. No effusion or pneumothorax is seen. No significant pulmonary nodule or mass is seen. Musculoskeletal: Degenerative changes are present in the thoracic spine. No acute osseous abnormality is seen. Review of the MIP images confirms the above findings. CTA ABDOMEN AND PELVIS FINDINGS VASCULAR Aorta: Normal caliber aorta without aneurysm, dissection, vasculitis or significant stenosis. Aortic atherosclerosis. Celiac: Patent without evidence of dissection, vasculitis or significant stenosis. There is mild enlargement of the proximal celiac artery with no evidence stenosis. SMA: Patent without  evidence of aneurysm, dissection, vasculitis or significant stenosis. Renals: Both renal arteries are patent without evidence of aneurysm, dissection, vasculitis, fibromuscular dysplasia or significant stenosis. Accessory renal arteries are noted bilaterally. IMA: Patent without evidence of aneurysm, dissection, vasculitis or significant stenosis. Inflow: Patent without evidence of aneurysm, dissection, vasculitis or significant stenosis. Veins: No obvious venous abnormality within the limitations of this arterial phase study. Review of the MIP images confirms the above findings. NON-VASCULAR Hepatobiliary: No focal liver abnormality is seen. No gallstones, gallbladder wall thickening, or biliary dilatation. Pancreas: Unremarkable. No pancreatic ductal dilatation or surrounding inflammatory changes. Spleen: Normal in size without focal abnormality. Adrenals/Urinary Tract: There is a 1.7 cm nodule in the right adrenal gland with attenuation of 0 Hounsfield units on noncontrast exam, compatible with adenoma. No adnexal mass on the left. Cysts are present within the kidneys bilaterally. The kidneys enhance symmetrically. Nonobstructive renal calculi are noted on the left. There is no hydronephrosis bilaterally. The bladder is unremarkable. Stomach/Bowel: There is a small hiatal hernia. The stomach is otherwise within normal limits. A few prominent fluid-filled loops of bowel are noted in the abdomen measuring up to 3.5 cm. There is a suspected transition point in the mid left abdomen, best seen on coronal image 9 and sagittal image 190. No pneumatosis or free air is seen. Appendix appears normal. Lymphatic: No abdominal or pelvic lymphadenopathy is seen. Reproductive: The prostate gland is enlarged. Other: There is a trace amount of free fluid in the pelvis. A fat containing umbilical hernia is present. Musculoskeletal: Degenerative changes are present in the lumbar spine with regions of severe spinal canal stenosis at  L3-L4 and L4-L5. No acute osseous abnormality is seen. Review of the MIP images confirms the above findings. IMPRESSION: 1. Aortic atherosclerosis without evidence of dissection. 2. Dilated loops of small bowel in the abdomen with a suspected trace shin point in the left lower quadrant, concerning for partial or early small bowel obstruction. 3. 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 Recommend annual imaging followup by CTA or MRA. This recommendation follows 2010 ACCF/AHA/AATS/ACR/ASA/SCA/SCAI/SIR/STS/SVM Guidelines for the Diagnosis and Management of Patients with Thoracic Aortic Disease. Circulation. 2010; 121: Z733-z630. Aortic aneurysm NOS (ICD10-I71.9) 4. Distended pulmonary trunk suggesting underlying pulmonary artery hypertension. 5. Small hiatal hernia. 6. Nonobstructive left renal calculus. 7. Enlarged prostate gland. Critical Value/emergent results were called by telephone at the time of interpretation on 03/23/2024 at 3:13 pm to provider [facility] , who verbally acknowledged these results. Electronically Signed   By: Leita Birmingham M.D.   On: 03/23/2024 15:13   US  Abdomen Limited RUQ (LIVER/GB) Result Date: 03/23/2024 EXAM: Right Upper Quadrant Abdominal Ultrasound TECHNIQUE: Real-time ultrasonography of the right upper quadrant of the abdomen was performed. COMPARISON: Comparison with CT 01/27/2015. CLINICAL HISTORY: RUQ abdominal pain. FINDINGS: LIVER: The liver demonstrates normal echogenicity. No intrahepatic biliary ductal dilatation. No mass. BILIARY SYSTEM: No evidence of pericholecystic fluid or wall thickening. No  cholelithiasis. Negative sonographic Murphy's sign. Common bile duct is within normal limits measuring 5 mm. RIGHT KIDNEY: The right kidney contains a 6 cm renal cyst without internal vascularity. No follow-up recommended. OTHER: No right upper quadrant ascites. IMPRESSION: 1. No evidence of cholelithiasis or other acute abnormality in the right upper  quadrant. Electronically signed by: Norman Gatlin MD 03/23/2024 02:46 AM EDT RP Workstation: [id number]    A/P: ARIN PERAL is an 73 y.o. male with DM, HTN, HLD, OSA, afib, BPH, kidney stones here with suspected at least partial small bowel obstruction  - NPO, MIVF - NG if intractable nausea/vomiting - SBO protocol with PO contrast - We also spent time discussing the relevant anatomy and pathophysiology of ileus versus small bowel obstructions.  Given that his stomach is completely decompressed, okay to hold off on NG tube for now unless he were to develop ongoing vomiting.  May actually have some sort of underlying gastroenteritis induced ileus given all these findings, absence of discrete transition point, and no prior surgical history.  No evident masses on his CT scan.  We will follow with you.  I spent a total of 80 minutes in both face-to-face and non-face-to-face activities, excluding procedures performed, for this visit on the date of this encounter.  Lonni Pizza, MD [facility], [facility]

## 2024-03-23 NOTE — ED Provider Notes (Signed)
 Care of the patient assumed at signout.  I discussed the patient's case with her surgeon, Dr. Teresa, they will follow the consulting service.  Patient admitted to internal medicine team.   Garrick Charleston, MD 03/23/24 1550

## 2024-03-23 NOTE — ED Notes (Signed)
 Several unsuccessful IV attempts by 2 RN

## 2024-03-23 NOTE — ED Provider Notes (Signed)
 [place] [facility]PARTMENT AT [facility] Provider Note   CSN: [id number] Arrival date & time: 03/23/24  1038     Patient presents with: Abdominal Pain, Nausea, and Emesis   Phillip Shannon is a 73 y.o. male.   The history is provided by the patient and medical records. No language interpreter was used.  Abdominal Pain Pain location:  Generalized Pain quality: aching, heavy and sharp   Pain radiates to:  Does not radiate Pain severity:  Severe Onset quality:  Gradual Duration:  2 days Timing:  Constant Progression:  Waxing and waning Chronicity:  New Context: not trauma   Relieved by:  Nothing Worsened by:  Nothing Ineffective treatments:  None tried Associated symptoms: nausea and vomiting   Associated symptoms: no chest pain, no chills, no constipation, no cough, no diarrhea, no dysuria, no fatigue, no fever and no shortness of breath   Emesis Severity:  Severe Timing:  Constant Quality:  Stomach contents Associated symptoms: abdominal pain   Associated symptoms: no chills, no cough, no diarrhea, no fever and no headaches        Prior to Admission medications   Medication Sig Start Date End Date Taking? Authorizing Provider  aspirin  EC 325 MG tablet Take 325 mg by mouth daily.    [provider]  atorvastatin  (LIPITOR) 40 MG tablet Take 1 tablet (40 mg total) by mouth daily. 10/05/21   Court Dorn PARAS, MD  atorvastatin  (LIPITOR) 40 MG tablet Take 1 tablet (40 mg total) by mouth daily. 09/13/23     diclofenac  (VOLTAREN ) 75 MG EC tablet Take 1 tablet by mouth 2 times a day with food or  milk as needed for pain Patient not taking: Reported on 05/02/2023 10/26/20     doxycycline  (VIBRAMYCIN ) 100 MG capsule Take 1 capsule (100 mg total) by mouth daily. 03/05/24     gabapentin  (NEURONTIN ) 300 MG capsule Take 1 capsule (300 mg total) by mouth 3 (three) times daily. 12/27/22     gabapentin  (NEURONTIN ) 300 MG capsule Take 1 capsule (300 mg total) by  mouth 3 (three) times daily. 01/08/24     HYDROcodone -acetaminophen  (NORCO/VICODIN) 5-325 MG tablet Take 1 tablet by mouth every 6 (six) to 8 (eight) hours. 02/10/23     metFORMIN  (GLUCOPHAGE ) 500 MG tablet Take 1 tablet (500 mg total) by mouth 2 (two) times daily with a meal. 11/15/22   Whitmire, Dawn, FNP  metFORMIN  (GLUCOPHAGE ) 500 MG tablet Take 1 tablet (500 mg total) by mouth 2 (two) times daily with a meal. 09/29/23     metoprolol  succinate (TOPROL -XL) 25 MG 24 hr tablet Take 1 tablet (25 mg total) by mouth at bedtime. 09/15/22     metoprolol  succinate (TOPROL -XL) 25 MG 24 hr tablet Take 1 tablet (25 mg total) by mouth at bedtime. 09/13/23     ondansetron  (ZOFRAN -ODT) 4 MG disintegrating tablet Take 1 tablet (4 mg total) by mouth every 8 (eight) hours as needed for up to 3 days for nausea or vomiting. 03/23/24 03/26/24  Trine Raynell Moder, MD  Semaglutide ,0.25 or 0.5MG /DOS, (OZEMPIC , 0.25 OR 0.5 MG/DOSE,) 2 MG/3ML SOPN Inject 0.25 mg into the skin once a week. 09/20/23     tadalafil  (CIALIS ) 20 MG tablet Take 1 tablet (20 mg total) by mouth daily as needed for sexual function 03/28/22     tadalafil  (CIALIS ) 20 MG tablet Take 1 tablet (20 mg total) by mouth daily as needed for sexual function 03/10/23     tamsulosin  (FLOMAX ) 0.4  MG CAPS capsule Take 1 capsule (0.4 mg total) by mouth daily for urinary frequency/nighttime urination 01/26/24     tirzepatide  (ZEPBOUND ) 2.5 MG/0.5ML Pen Inject 2.5 mg into the skin once a week. 09/13/23     traMADol  (ULTRAM ) 50 MG tablet Take 1-2 tablets (50-100 mg total) by mouth 3 (three) times daily as needed for pain 02/17/23     Vitamin D , Ergocalciferol , (DRISDOL ) 1.25 MG (50000 UNIT) CAPS capsule Take 1 capsule (50,000 Units total) by mouth every 7 (seven) days. 11/15/22   Whitmire, Dawn, FNP    Allergies: Hornet venom and Codeine    Review of Systems  Constitutional:  Negative for chills, fatigue and fever.  HENT:  Negative for congestion.   Eyes:  Negative for visual  disturbance.  Respiratory:  Negative for cough, chest tightness, shortness of breath and wheezing.   Cardiovascular:  Negative for chest pain and palpitations.  Gastrointestinal:  Positive for abdominal pain, nausea and vomiting. Negative for constipation and diarrhea.  Genitourinary:  Negative for dysuria, flank pain and frequency.  Musculoskeletal:  Negative for back pain, neck pain and neck stiffness.  Skin:  Negative for rash and wound.  Neurological:  Negative for headaches.  Psychiatric/Behavioral:  Negative for confusion.   All other systems reviewed and are negative.   Updated Vital Signs BP (!) 201/124 (BP Location: Right Arm)   Pulse 84   Temp 98.5 F (36.9 C) (Oral)   Resp 18   SpO2 100%   Physical Exam Vitals and nursing note reviewed.  Constitutional:      General: He is in acute distress.     Appearance: He is well-developed. He is not ill-appearing, toxic-appearing or diaphoretic.  HENT:     Head: Normocephalic and atraumatic.     Mouth/Throat:     Mouth: Mucous membranes are moist.  Eyes:     Conjunctiva/sclera: Conjunctivae normal.  Cardiovascular:     Rate and Rhythm: Normal rate and regular rhythm.     Heart sounds: No murmur heard. Pulmonary:     Effort: Pulmonary effort is normal. No respiratory distress.     Breath sounds: Normal breath sounds. No wheezing, rhonchi or rales.  Chest:     Chest wall: No tenderness.  Abdominal:     General: Abdomen is flat. Bowel sounds are normal.     Palpations: Abdomen is soft.     Tenderness: There is abdominal tenderness.  Musculoskeletal:        General: No swelling.     Cervical back: Neck supple.  Skin:    General: Skin is warm and dry.     Capillary Refill: Capillary refill takes less than 2 seconds.     Coloration: Skin is not pale.  Neurological:     General: No focal deficit present.     Mental Status: He is alert.  Psychiatric:        Mood and Affect: Mood normal.     (all labs ordered are  listed, but only abnormal results are displayed) Labs Reviewed  URINALYSIS, ROUTINE W REFLEX MICROSCOPIC - Abnormal; Notable for the following components:      Result Value   Ketones, ur 5 (*)    All other components within normal limits  CBC  LIPASE, BLOOD  COMPREHENSIVE METABOLIC PANEL WITH GFR  LACTIC ACID, PLASMA  LACTIC ACID, PLASMA  TROPONIN T, HIGH SENSITIVITY  TROPONIN T, HIGH SENSITIVITY    EKG: EKG Interpretation Date/Time:  Saturday March 23 2024 14:02:41 EDT Ventricular Rate:  74 PR Interval:    QRS Duration:  101 QT Interval:  393 QTC Calculation: 436 R Axis:   11  Text Interpretation: Atrial fibrillation Low voltage, precordial leads when compared to prior, similar appearance no STEMI Confirmed by Ginger Barefoot ([id number]) on 03/23/2024 2:18:02 PM  Radiology: CT Angio Chest/Abd/Pel for Dissection W and/or Wo Contrast Result Date: 03/23/2024 CLINICAL DATA:  Acute aortic syndrome suspected. Severe abdominal pain with nausea vomiting. Hypertension. Clinical concern for dissection versus ischemic bowel. EXAM: CT ANGIOGRAPHY CHEST, ABDOMEN AND PELVIS TECHNIQUE: Non-contrast CT of the chest was initially obtained. Multidetector CT imaging through the chest, abdomen and pelvis was performed using the standard protocol during bolus administration of intravenous contrast. Multiplanar reconstructed images and MIPs were obtained and reviewed to evaluate the vascular anatomy. RADIATION DOSE REDUCTION: This exam was performed according to the departmental dose-optimization program which includes automated exposure control, adjustment of the mA and/or kV according to patient size and/or use of iterative reconstruction technique. CONTRAST:  OMNIPAQUE  IOHEXOL  350 MG/ML SOLN COMPARISON:  01/27/2015. FINDINGS: CTA CHEST FINDINGS Cardiovascular: The heart is borderline enlarged a small pericardial effusion is noted. There is aneurysmal dilatation of the ascending aorta measuring 4.4 cm.  No dissection is seen. The pulmonary trunk is distended suggesting underlying pulmonary artery hypertension. Mediastinum/Nodes: No mediastinal, hilar, or axillary lymphadenopathy is seen. The thyroid  gland, trachea, and esophagus are within normal limits. There is a small hiatal hernia. Lungs/Pleura: Atelectasis is present bilaterally. No effusion or pneumothorax is seen. No significant pulmonary nodule or mass is seen. Musculoskeletal: Degenerative changes are present in the thoracic spine. No acute osseous abnormality is seen. Review of the MIP images confirms the above findings. CTA ABDOMEN AND PELVIS FINDINGS VASCULAR Aorta: Normal caliber aorta without aneurysm, dissection, vasculitis or significant stenosis. Aortic atherosclerosis. Celiac: Patent without evidence of dissection, vasculitis or significant stenosis. There is mild enlargement of the proximal celiac artery with no evidence stenosis. SMA: Patent without evidence of aneurysm, dissection, vasculitis or significant stenosis. Renals: Both renal arteries are patent without evidence of aneurysm, dissection, vasculitis, fibromuscular dysplasia or significant stenosis. Accessory renal arteries are noted bilaterally. IMA: Patent without evidence of aneurysm, dissection, vasculitis or significant stenosis. Inflow: Patent without evidence of aneurysm, dissection, vasculitis or significant stenosis. Veins: No obvious venous abnormality within the limitations of this arterial phase study. Review of the MIP images confirms the above findings. NON-VASCULAR Hepatobiliary: No focal liver abnormality is seen. No gallstones, gallbladder wall thickening, or biliary dilatation. Pancreas: Unremarkable. No pancreatic ductal dilatation or surrounding inflammatory changes. Spleen: Normal in size without focal abnormality. Adrenals/Urinary Tract: There is a 1.7 cm nodule in the right adrenal gland with attenuation of 0 Hounsfield units on noncontrast exam, compatible with  adenoma. No adnexal mass on the left. Cysts are present within the kidneys bilaterally. The kidneys enhance symmetrically. Nonobstructive renal calculi are noted on the left. There is no hydronephrosis bilaterally. The bladder is unremarkable. Stomach/Bowel: There is a small hiatal hernia. The stomach is otherwise within normal limits. A few prominent fluid-filled loops of bowel are noted in the abdomen measuring up to 3.5 cm. There is a suspected transition point in the mid left abdomen, best seen on coronal image 9 and sagittal image 190. No pneumatosis or free air is seen. Appendix appears normal. Lymphatic: No abdominal or pelvic lymphadenopathy is seen. Reproductive: The prostate gland is enlarged. Other: There is a trace amount of free fluid in the pelvis. A fat containing umbilical hernia is present. Musculoskeletal: Degenerative changes  are present in the lumbar spine with regions of severe spinal canal stenosis at L3-L4 and L4-L5. No acute osseous abnormality is seen. Review of the MIP images confirms the above findings. IMPRESSION: 1. Aortic atherosclerosis without evidence of dissection. 2. Dilated loops of small bowel in the abdomen with a suspected trace shin point in the left lower quadrant, concerning for partial or early small bowel obstruction. 3. 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 Recommend annual imaging followup by CTA or MRA. This recommendation follows 2010 ACCF/AHA/AATS/ACR/ASA/SCA/SCAI/SIR/STS/SVM Guidelines for the Diagnosis and Management of Patients with Thoracic Aortic Disease. Circulation. 2010; 121: Z733-z630. Aortic aneurysm NOS (ICD10-I71.9) 4. Distended pulmonary trunk suggesting underlying pulmonary artery hypertension. 5. Small hiatal hernia. 6. Nonobstructive left renal calculus. 7. Enlarged prostate gland. Critical Value/emergent results were called by telephone at the time of interpretation on 03/23/2024 at 3:13 pm to provider [facility] , who  verbally acknowledged these results. Electronically Signed   By: Leita Birmingham M.D.   On: 03/23/2024 15:13   US  Abdomen Limited RUQ (LIVER/GB) Result Date: 03/23/2024 EXAM: Right Upper Quadrant Abdominal Ultrasound TECHNIQUE: Real-time ultrasonography of the right upper quadrant of the abdomen was performed. COMPARISON: Comparison with CT 01/27/2015. CLINICAL HISTORY: RUQ abdominal pain. FINDINGS: LIVER: The liver demonstrates normal echogenicity. No intrahepatic biliary ductal dilatation. No mass. BILIARY SYSTEM: No evidence of pericholecystic fluid or wall thickening. No cholelithiasis. Negative sonographic Murphy's sign. Common bile duct is within normal limits measuring 5 mm. RIGHT KIDNEY: The right kidney contains a 6 cm renal cyst without internal vascularity. No follow-up recommended. OTHER: No right upper quadrant ascites. IMPRESSION: 1. No evidence of cholelithiasis or other acute abnormality in the right upper quadrant. Electronically signed by: Norman Gatlin MD 03/23/2024 02:46 AM EDT RP Workstation: [id number]     Procedures   CRITICAL CARE Performed by: Lonni PARAS Dariel Betzer Total critical care time: 30 minutes Critical care time was exclusive of separately billable procedures and treating other patients. Critical care was necessary to treat or prevent imminent or life-threatening deterioration. Critical care was time spent personally by me on the following activities: development of treatment plan with patient and/or surrogate as well as nursing, discussions with consultants, evaluation of patient's response to treatment, examination of patient, obtaining history from patient or surrogate, ordering and performing treatments and interventions, ordering and review of laboratory studies, ordering and review of radiographic studies, pulse oximetry and re-evaluation of patient's condition.   Medications Ordered in the ED  HYDROmorphone  (DILAUDID ) injection 1 mg (1 mg Intravenous Given  03/23/24 1408)  ondansetron  (ZOFRAN ) injection 4 mg (4 mg Intravenous Given 03/23/24 1408)  iohexol  (OMNIPAQUE ) 350 MG/ML injection 100 mL (100 mLs Intravenous Contrast Given 03/23/24 1433)                                    Medical Decision Making Amount and/or Complexity of Data Reviewed Labs: ordered. Radiology: ordered.  Risk Prescription drug management.    Phillip Shannon is a 73 y.o. male with a past medical history significant for atrial fibrillation, hyperlipidemia, previous kidney stones requiring lithotripsy, and thoracic aortic aneurysm who presents with severe abdominal pain.  Patient was seen overnight and had workup that was otherwise reassuring but was discharged home and then promptly started having severe 10 out of 10 abdominal pain, nausea, vomiting, and blood pressure over 200.  He says that the pain is pressure-like and occasionally sharp.  He reports it is across his abdomen but worse in the upper abdomen.  He had ultrasound overnight that did not show acute cholecystitis but he is actively dry heaving and in severe pain.  Blood pressure was over 200 on arrival and per chart patient does have a history of thoracic aortic aneurysm.  On exam, abdomen is very tender and I did hear bowel sounds.  Lungs were clear.  I did not hear a murmur.  He has intact pulses in extremities and legs are slightly edematous.  Due to his blood pressure over 200 with severe pain in his abdomen with history of possible aneurysm, will get CT dissection study of his torso.  Will give him some pain medicine, nausea medicine, and get screening labs.  Given his bouncing back today, anticipate he may require admission given how uncomfortable the patient appears.  Anticipate reassessment after imaging and workup.  Patient found to have bowel obstruction.  Will call medicine for admission and general surgery.  General surgery will come see.      Final diagnoses:  Generalized abdominal pain   Nausea and vomiting, unspecified vomiting type  Small bowel obstruction (HCC)     Clinical Impression: 1. Generalized abdominal pain   2. Nausea and vomiting, unspecified vomiting type   3. Small bowel obstruction (HCC)     Disposition: Care will be transferred to oncoming team to wait for workup results and reassessment.  This note was prepared with assistance of Conservation officer, historic buildings. Occasional wrong-word or sound-a-like substitutions may have occurred due to the inherent limitations of voice recognition software.      Maude Hettich, Lonni PARAS, MD 03/23/24 1520

## 2024-03-23 NOTE — ED Provider Notes (Signed)
 [place] EMERGENCY DEPARTMENT AT [facility] Provider Note  CSN: [id number] Arrival date & time: 03/22/24 2322  Chief Complaint(s) Abdominal Pain  HPI Phillip Shannon is a 73 y.o. male     Abdominal Pain Pain location:  Epigastric and RUQ Pain quality: aching and cramping   Pain radiates to:  Does not radiate Pain severity:  Moderate Onset quality:  Gradual Duration:  6 hours Timing:  Constant Progression:  Waxing and waning Chronicity:  New Relieved by:  Nothing Worsened by:  Nothing Associated symptoms: nausea   Associated symptoms: no chest pain, no cough, no diarrhea, no dysuria, no fever, no shortness of breath and no vomiting     Past Medical History Past Medical History:  Diagnosis Date   Arthritis    Atrial fibrillation (HCC)    Back pain    Kidney stone    Knee swelling    Paroxysmal atrial fibrillation (HCC)    PONV (postoperative nausea and vomiting)    Sleep apnea    Patient Active Problem List   Diagnosis Date Noted   Thoracic aortic aneurysm (HCC) 05/02/2023   Erectile dysfunction 12/22/2022   Low back pain 12/22/2022   Onychomycosis 12/22/2022   Pain in left knee 12/22/2022   Other fatigue 08/23/2022   BMI 38.0-38.9,adult 08/23/2022   Obesity, Beginning BMI 08/23/2022   Prediabetes 07/26/2022   Vitamin D  deficiency 07/26/2022   Hyperlipidemia 06/24/2020   Paroxysmal atrial fibrillation (HCC)    Hx of cardiomegaly 07/17/2017   Obesity (BMI 30.0-34.9) 07/17/2017   Sleep apnea 07/17/2017   Pain in right hip 07/28/2016   Atrial fibrillation, persistent (HCC) 03/11/2015   Renal colic on left side 01/27/2015   Left ureteral stone 01/27/2015   Ureteral stone 12/30/2013   Renal calculus 04/23/2012   Home Medication(s) Prior to Admission medications   Medication Sig Start Date End Date Taking? Authorizing Provider  ondansetron  (ZOFRAN -ODT) 4 MG disintegrating tablet Take 1 tablet (4 mg total) by mouth every 8 (eight) hours as  needed for up to 3 days for nausea or vomiting. 03/23/24 03/26/24 Yes Kathryn Cosby, Raynell Moder, MD  aspirin  EC 325 MG tablet Take 325 mg by mouth daily.    [provider]  atorvastatin  (LIPITOR) 40 MG tablet Take 1 tablet (40 mg total) by mouth daily. 10/05/21   Court Dorn PARAS, MD  atorvastatin  (LIPITOR) 40 MG tablet Take 1 tablet (40 mg total) by mouth daily. 09/13/23     diclofenac  (VOLTAREN ) 75 MG EC tablet Take 1 tablet by mouth 2 times a day with food or  milk as needed for pain Patient not taking: Reported on 05/02/2023 10/26/20     doxycycline  (VIBRAMYCIN ) 100 MG capsule Take 1 capsule (100 mg total) by mouth daily. 03/05/24     gabapentin  (NEURONTIN ) 300 MG capsule Take 1 capsule (300 mg total) by mouth 3 (three) times daily. 12/27/22     gabapentin  (NEURONTIN ) 300 MG capsule Take 1 capsule (300 mg total) by mouth 3 (three) times daily. 01/08/24     HYDROcodone -acetaminophen  (NORCO/VICODIN) 5-325 MG tablet Take 1 tablet by mouth every 6 (six) to 8 (eight) hours. 02/10/23     metFORMIN  (GLUCOPHAGE ) 500 MG tablet Take 1 tablet (500 mg total) by mouth 2 (two) times daily with a meal. 11/15/22   Whitmire, Dawn, FNP  metFORMIN  (GLUCOPHAGE ) 500 MG tablet Take 1 tablet (500 mg total) by mouth 2 (two) times daily with a meal. 09/29/23     metoprolol  succinate (TOPROL -XL) 25 MG  24 hr tablet Take 1 tablet (25 mg total) by mouth at bedtime. 09/15/22     metoprolol  succinate (TOPROL -XL) 25 MG 24 hr tablet Take 1 tablet (25 mg total) by mouth at bedtime. 09/13/23     Semaglutide ,0.25 or 0.5MG /DOS, (OZEMPIC , 0.25 OR 0.5 MG/DOSE,) 2 MG/3ML SOPN Inject 0.25 mg into the skin once a week. 09/20/23     tadalafil  (CIALIS ) 20 MG tablet Take 1 tablet (20 mg total) by mouth daily as needed for sexual function 03/28/22     tadalafil  (CIALIS ) 20 MG tablet Take 1 tablet (20 mg total) by mouth daily as needed for sexual function 03/10/23     tamsulosin  (FLOMAX ) 0.4 MG CAPS capsule Take 1 capsule (0.4 mg total) by mouth daily  for urinary frequency/nighttime urination 01/26/24     tirzepatide  (ZEPBOUND ) 2.5 MG/0.5ML Pen Inject 2.5 mg into the skin once a week. 09/13/23     traMADol  (ULTRAM ) 50 MG tablet Take 1-2 tablets (50-100 mg total) by mouth 3 (three) times daily as needed for pain 02/17/23     Vitamin D , Ergocalciferol , (DRISDOL ) 1.25 MG (50000 UNIT) CAPS capsule Take 1 capsule (50,000 Units total) by mouth every 7 (seven) days. 11/15/22   Whitmire, Dawn, FNP                                                                                                                                    Allergies Hornet venom and Codeine  Review of Systems Review of Systems  Constitutional:  Negative for fever.  Respiratory:  Negative for cough and shortness of breath.   Cardiovascular:  Negative for chest pain.  Gastrointestinal:  Positive for abdominal pain and nausea. Negative for diarrhea and vomiting.  Genitourinary:  Negative for dysuria.   As noted in HPI  Physical Exam Vital Signs  I have reviewed the triage vital signs BP (!) 161/104   Pulse 76   Temp 98 F (36.7 C)   Resp 18   SpO2 98%   Physical Exam Vitals reviewed.  Constitutional:      General: He is not in acute distress.    Appearance: He is well-developed. He is not diaphoretic.  HENT:     Head: Normocephalic and atraumatic.     Right Ear: External ear normal.     Left Ear: External ear normal.     Nose: Nose normal.     Mouth/Throat:     Mouth: Mucous membranes are moist.  Eyes:     General: No scleral icterus.    Conjunctiva/sclera: Conjunctivae normal.  Neck:     Trachea: Phonation normal.  Cardiovascular:     Rate and Rhythm: Normal rate and regular rhythm.  Pulmonary:     Effort: Pulmonary effort is normal. No respiratory distress.     Breath sounds: No stridor.  Abdominal:     General: There is no distension.     Tenderness: There is abdominal  tenderness in the right upper quadrant and epigastric area. There is no guarding or  rebound. Negative signs include McBurney's sign.  Musculoskeletal:        General: Normal range of motion.     Cervical back: Normal range of motion.  Neurological:     Mental Status: He is alert and oriented to person, place, and time.  Psychiatric:        Behavior: Behavior normal.     ED Results and Treatments Labs (all labs ordered are listed, but only abnormal results are displayed) Labs Reviewed  COMPREHENSIVE METABOLIC PANEL WITH GFR - Abnormal; Notable for the following components:      Result Value   Glucose, Bld 104 (*)    Total Bilirubin 1.5 (*)    All other components within normal limits  LIPASE, BLOOD  CBC  URINALYSIS, ROUTINE W REFLEX MICROSCOPIC                                                                                                                         EKG  EKG Interpretation Date/Time:    Ventricular Rate:    PR Interval:    QRS Duration:    QT Interval:    QTC Calculation:   R Axis:      Text Interpretation:         Radiology US  Abdomen Limited RUQ (LIVER/GB) Result Date: 03/23/2024 EXAM: Right Upper Quadrant Abdominal Ultrasound TECHNIQUE: Real-time ultrasonography of the right upper quadrant of the abdomen was performed. COMPARISON: Comparison with CT 01/27/2015. CLINICAL HISTORY: RUQ abdominal pain. FINDINGS: LIVER: The liver demonstrates normal echogenicity. No intrahepatic biliary ductal dilatation. No mass. BILIARY SYSTEM: No evidence of pericholecystic fluid or wall thickening. No cholelithiasis. Negative sonographic Murphy's sign. Common bile duct is within normal limits measuring 5 mm. RIGHT KIDNEY: The right kidney contains a 6 cm renal cyst without internal vascularity. No follow-up recommended. OTHER: No right upper quadrant ascites. IMPRESSION: 1. No evidence of cholelithiasis or other acute abnormality in the right upper quadrant. Electronically signed by: Norman Gatlin MD 03/23/2024 02:46 AM EDT RP Workstation: [id number]     Medications Ordered in ED Medications  HYDROmorphone  (DILAUDID ) injection 0.5 mg (0.5 mg Intravenous Given 03/23/24 0103)  sodium chloride  0.9 % bolus 1,000 mL (0 mLs Intravenous Stopped 03/23/24 0337)  ondansetron  (ZOFRAN ) injection 4 mg (4 mg Intravenous Given 03/23/24 0054)  pantoprazole  (PROTONIX ) injection 40 mg (40 mg Intravenous Given 03/23/24 0055)   Procedures Procedures  (including critical care time) Medical Decision Making / ED Course   Medical Decision Making Amount and/or Complexity of Data Reviewed Labs: ordered. Decision-making details documented in ED Course. Radiology: ordered and independent interpretation performed. Decision-making details documented in ED Course.  Risk Prescription drug management.    Epigastric and right upper quadrant pain differential diagnosis considered  CBC without leukocytosis or anemia.  CMP without significant electrolyte derangements.  Mild hyperglycemia without DKA.  Mildly elevated bilirubin without other signs concerning for biliary obstruction or pancreatitis.  Right upper quadrant ultrasound negative for biliary disease.  UA without evidence of infection.  Patient treated with medication above resulting in complete resolution of pain.  Repeat abdominal exam was completely benign.  I have low suspicion for serious intra-abdominal inflammatory/infectious process or bowel obstruction requiring additional imaging at this time.    Final Clinical Impression(s) / ED Diagnoses Final diagnoses:  Upper abdominal pain   The patient appears reasonably screened and/or stabilized for discharge and I doubt any other medical condition or other [facility] requiring further screening, evaluation, or treatment in the ED at this time. I have discussed the findings, Dx and Tx plan with the patient/family who expressed understanding and agree(s) with the plan. Discharge instructions discussed at length. The patient/family was given strict return precautions  who verbalized understanding of the instructions. No further questions at time of discharge.  Disposition: Discharge  Condition: Good  ED Discharge Orders          Ordered    ondansetron  (ZOFRAN -ODT) 4 MG disintegrating tablet  Every 8 hours PRN        03/23/24 0442             Follow Up: Leonel Cole, MD [address] [place] [address] [phone]  Call  to schedule an appointment for close follow up    This chart was dictated using voice recognition software.  Despite best efforts to proofread,  errors can occur which can change the documentation meaning.    Trine Raynell Moder, MD 03/23/24 [id number]

## 2024-03-23 NOTE — H&P (Signed)
 History and Physical    Patient: Phillip Shannon FMW:[id number] DOB: 1950/10/25 DOA: 03/23/2024 DOS: the patient was seen and examined on 03/23/2024 PCP: Leonel Cole, MD  Patient coming from: Home  Chief Complaint:  Chief Complaint  Patient presents with   Abdominal Pain   Nausea   Emesis   HPI: Phillip Shannon is a 73 y.o. male with medical history significant of osteoarthritis, atrial fibrillation, chronic back pain, nephrolithiasis, persistent atrial fibrillation, sleep apnea, obesity who presented to the emergency department last night due to abdominal pain, was discharged home after benign workup and treatment, but returned due to increased intensity of abdominal pain associated with severe dry heaving and diaphoresis.   No diarrhea, constipation, melena or hematochezia.  No flank pain, dysuria, frequency or hematuria. He denied fever, chills, rhinorrhea, sore throat, wheezing or hemoptysis.  No chest pain, palpitations, diaphoresis, PND, orthopnea or pitting edema of the lower extremities. No polyuria, polydipsia, polyphagia or blurred vision.   Lab work: Urinalysis showed ketones of 5 mg/dL but was otherwise unremarkable.  CBC, lactic acid, lipase, magnesium, phosphorus, troponin x 2 normal.  CMP showed a glucose of 114 and total bilirubin 2.1 mg/dL, the rest of the CMP measurements were normal.  Imaging: RUQ ultrasound with no evidence of cholelithiasis or any other acute abnormality.  CTA chest showing dilated loops of small bowel in the abdomen with a suspected trace Chinh point in the left lower quadrant, concerning for partial or early SBO.  Please see full radiology report for further details.  ED course: Initial vital signs were temperature 98.5 F, pulse 84, respiration 18, BP 201/124 mmHg O2 and sat 100% on room air.  The patient received hydromorphone  1 mg IVP, ondansetron  4 mg IVP and I added pantoprazole  40 mg IVP.   Review of Systems: As mentioned in the history of present  illness. All other systems reviewed and are negative.  Past Medical History:  Diagnosis Date   Arthritis    Atrial fibrillation (HCC)    Back pain    Kidney stone    Knee swelling    Paroxysmal atrial fibrillation (HCC)    PONV (postoperative nausea and vomiting)    Sleep apnea    Past Surgical History:  Procedure Laterality Date   BACK SURGERY     CYSTOSCOPY WITH RETROGRADE PYELOGRAM, URETEROSCOPY AND STENT PLACEMENT Right 12/31/2013   Procedure: CYSTOSCOPY WITH RETROGRADE PYELOGRAM, URETEROSCOPY AND STONE REMOVAL;  Surgeon: Arlena LILLETTE Gal, MD;  Location: [facility];  Service: Urology;  Laterality: Right;   CYSTOSCOPY WITH RETROGRADE PYELOGRAM, URETEROSCOPY AND STENT PLACEMENT Left 01/27/2015   Procedure: CYSTOSCOPY WITH LEFT RETROGRADE PYELOGRAM, LEFT  SEMI-RIGID AND DIGITAL  URETEROSCOPY AND LEFT  STENT PLACEMENT STONE EXTRACTION;  Surgeon: Ricardo Likens, MD;  Location: [facility];  Service: Urology;  Laterality: Left;   EYE SURGERY Right 07/2013   HOLMIUM LASER APPLICATION Left 01/27/2015   Procedure: HOLMIUM LASER APPLICATION;  Surgeon: Ricardo Likens, MD;  Location: [facility];  Service: Urology;  Laterality: Left;   LITHOTRIPSY     Social History:  reports that he has never smoked. He has never used smokeless tobacco. He reports that he does not drink alcohol and does not use drugs.  Allergies  Allergen Reactions   Hornet Venom Anaphylaxis, Hives and Swelling    Swelling all over    Codeine Nausea And Vomiting    Family History  Problem Relation Age of Onset   Alcohol abuse Father    Heart disease Father  Heart disease Sister        aorta   Alcohol abuse Brother    Colon cancer Neg Hx    Colon polyps Neg Hx    Esophageal cancer Neg Hx    Rectal cancer Neg Hx    Stomach cancer Neg Hx     Prior to Admission medications   Medication Sig Start Date End Date Taking? Authorizing Provider  aspirin  EC 325 MG tablet Take 325 mg by mouth daily.    [provider]   atorvastatin  (LIPITOR) 40 MG tablet Take 1 tablet (40 mg total) by mouth daily. 10/05/21   Court Dorn PARAS, MD  atorvastatin  (LIPITOR) 40 MG tablet Take 1 tablet (40 mg total) by mouth daily. 09/13/23     diclofenac  (VOLTAREN ) 75 MG EC tablet Take 1 tablet by mouth 2 times a day with food or  milk as needed for pain Patient not taking: Reported on 05/02/2023 10/26/20     doxycycline  (VIBRAMYCIN ) 100 MG capsule Take 1 capsule (100 mg total) by mouth daily. 03/05/24     gabapentin  (NEURONTIN ) 300 MG capsule Take 1 capsule (300 mg total) by mouth 3 (three) times daily. 12/27/22     gabapentin  (NEURONTIN ) 300 MG capsule Take 1 capsule (300 mg total) by mouth 3 (three) times daily. 01/08/24     HYDROcodone -acetaminophen  (NORCO/VICODIN) 5-325 MG tablet Take 1 tablet by mouth every 6 (six) to 8 (eight) hours. 02/10/23     metFORMIN  (GLUCOPHAGE ) 500 MG tablet Take 1 tablet (500 mg total) by mouth 2 (two) times daily with a meal. 11/15/22   Whitmire, Dawn, FNP  metFORMIN  (GLUCOPHAGE ) 500 MG tablet Take 1 tablet (500 mg total) by mouth 2 (two) times daily with a meal. 09/29/23     metoprolol  succinate (TOPROL -XL) 25 MG 24 hr tablet Take 1 tablet (25 mg total) by mouth at bedtime. 09/15/22     metoprolol  succinate (TOPROL -XL) 25 MG 24 hr tablet Take 1 tablet (25 mg total) by mouth at bedtime. 09/13/23     ondansetron  (ZOFRAN -ODT) 4 MG disintegrating tablet Take 1 tablet (4 mg total) by mouth every 8 (eight) hours as needed for up to 3 days for nausea or vomiting. 03/23/24 03/26/24  Trine Raynell Moder, MD  Semaglutide ,0.25 or 0.5MG /DOS, (OZEMPIC , 0.25 OR 0.5 MG/DOSE,) 2 MG/3ML SOPN Inject 0.25 mg into the skin once a week. 09/20/23     tadalafil  (CIALIS ) 20 MG tablet Take 1 tablet (20 mg total) by mouth daily as needed for sexual function 03/28/22     tadalafil  (CIALIS ) 20 MG tablet Take 1 tablet (20 mg total) by mouth daily as needed for sexual function 03/10/23     tamsulosin  (FLOMAX ) 0.4 MG CAPS capsule Take 1 capsule  (0.4 mg total) by mouth daily for urinary frequency/nighttime urination 01/26/24     tirzepatide  (ZEPBOUND ) 2.5 MG/0.5ML Pen Inject 2.5 mg into the skin once a week. 09/13/23     traMADol  (ULTRAM ) 50 MG tablet Take 1-2 tablets (50-100 mg total) by mouth 3 (three) times daily as needed for pain 02/17/23     Vitamin D , Ergocalciferol , (DRISDOL ) 1.25 MG (50000 UNIT) CAPS capsule Take 1 capsule (50,000 Units total) by mouth every 7 (seven) days. 11/15/22   Lauro Morrison, FNP    Physical Exam: Vitals:   03/23/24 1043 03/23/24 1130 03/23/24 1415 03/23/24 1524  BP: (!) 201/124 (!) 169/106 (!) 153/99 (!) 133/91  Pulse: 84 77 88 93  Resp: 18 12 20 14   Temp: 98.5 F (36.9  C)   98.8 F (37.1 C)  TempSrc: Oral   Oral  SpO2: 100% 96% 95% 95%   Physical Exam Vitals reviewed.  Constitutional:      Appearance: He is obese. He is ill-appearing.  HENT:     Head: Normocephalic.     Nose: No rhinorrhea.     Mouth/Throat:     Mouth: Mucous membranes are dry.  Eyes:     General: No scleral icterus.    Pupils: Pupils are equal, round, and reactive to light.  Cardiovascular:     Rate and Rhythm: Normal rate and regular rhythm.  Pulmonary:     Effort: Pulmonary effort is normal.     Breath sounds: Normal breath sounds. No wheezing, rhonchi or rales.  Abdominal:     General: Bowel sounds are normal. There is distension.     Palpations: Abdomen is soft.     Tenderness: There is abdominal tenderness in the epigastric area. There is no right CVA tenderness, left CVA tenderness, guarding or rebound.  Musculoskeletal:     Cervical back: Neck supple.     Right lower leg: No edema.     Left lower leg: No edema.  Skin:    General: Skin is warm and dry.  Neurological:     General: No focal deficit present.     Mental Status: He is alert and oriented to person, place, and time.  Psychiatric:        Mood and Affect: Mood normal. Mood is not anxious or depressed.        Behavior: Behavior normal.    Data  Reviewed:  Results are pending, will review when available. 06/01/2023 transthoracic echocardiogram report. IMPRESSIONS:   1. Left ventricular ejection fraction, by estimation, is 55 to 60%. The  left ventricle has normal function. The left ventricle has no regional  wall motion abnormalities. There is mild concentric left ventricular  hypertrophy. Left ventricular diastolic  function could not be evaluated.   2. Right ventricular systolic function is low normal. The right  ventricular size is mildly enlarged.   3. Left atrial size was mildly dilated.   4. The mitral valve is normal in structure. Mild mitral valve  regurgitation. No evidence of mitral stenosis.   5. The aortic valve is normal in structure. There is mild calcification  of the aortic valve. Aortic valve regurgitation is mild. No aortic  stenosis is present.   6. Aortic dilatation noted. There is mild dilatation of the ascending  aorta, measuring 43 mm.   7. The inferior vena cava is normal in size with greater than 50%  respiratory variability, suggesting right atrial pressure of 3 mmHg.   EKG: Vent. rate 74 BPM PR interval * ms QRS duration 101 ms QT/QTcB 393/436 ms P-R-T axes * 11 27 Atrial fibrillation Low voltage, precordial leadsH5  Assessment and Plan: Principal Problem:   Partial small bowel obstruction (HCC) Inpatient/telemetry. Keep NPO. No need for NGT at the moment. If emesis recurs, NGT placement. Continue IV fluids. Analgesics as needed. Antiemetics as needed. Pantoprazole  40 mg IVP every 24 hours. Keep electrolytes optimized. Follow-up CBC and CMP in AM. Follow-up imaging in the morning. General surgery input appreciated.  Active Problems:   Hyperbilirubinemia Will check LFTs in AM.    Atrial fibrillation, persistent (HCC) Not on anticoagulation. Will use parenteral metoprolol  while NPO.    Sleep apnea No CPAP due to SBO.    Hyperlipidemia Hold atorvastatin  for now. (NPO and  hyperbilirubinemia).  Prediabetes Monitor blood glucose.    Obesity Awaiting current weight. Will calculate BMI afterwards.    Low back pain Analgesics as needed.    Thoracic aortic aneurysm [facility]) Outpatient imaging and vascular surgery follow-up.      Advance Care Planning:   Code Status: Full Code   Consults:   Family Communication:   Severity of Illness: The appropriate patient status for this patient is INPATIENT. Inpatient status is judged to be reasonable and necessary in order to provide the required intensity of service to ensure the patient's safety. The patient's presenting symptoms, physical exam findings, and initial radiographic and laboratory data in the context of their chronic comorbidities is felt to place them at high risk for further clinical deterioration. Furthermore, it is not anticipated that the patient will be medically stable for discharge from the hospital within 2 midnights of admission.   * I certify that at the point of admission it is my clinical judgment that the patient will require inpatient hospital care spanning beyond 2 midnights from the point of admission due to high intensity of service, high risk for further deterioration and high frequency of surveillance required.*  Author: Alm Dorn Castor, MD 03/23/2024 3:37 PM  For on call review [email or web address].   This document was prepared using Dragon voice recognition software and may contain some unintended transcription errors.

## 2024-03-24 ENCOUNTER — Observation Stay (HOSPITAL_COMMUNITY)

## 2024-03-24 ENCOUNTER — Other Ambulatory Visit (HOSPITAL_COMMUNITY): Payer: Self-pay

## 2024-03-24 DIAGNOSIS — R1084 Generalized abdominal pain: Secondary | ICD-10-CM | POA: Diagnosis not present

## 2024-03-24 DIAGNOSIS — R112 Nausea with vomiting, unspecified: Secondary | ICD-10-CM

## 2024-03-24 DIAGNOSIS — Z0189 Encounter for other specified special examinations: Secondary | ICD-10-CM | POA: Diagnosis not present

## 2024-03-24 DIAGNOSIS — K5669 Other partial intestinal obstruction: Secondary | ICD-10-CM | POA: Diagnosis not present

## 2024-03-24 DIAGNOSIS — K56609 Unspecified intestinal obstruction, unspecified as to partial versus complete obstruction: Secondary | ICD-10-CM

## 2024-03-24 DIAGNOSIS — K56600 Partial intestinal obstruction, unspecified as to cause: Secondary | ICD-10-CM | POA: Diagnosis not present

## 2024-03-24 LAB — COMPREHENSIVE METABOLIC PANEL WITH GFR
ALT: 12 U/L (ref 0–44)
AST: 16 U/L (ref 15–41)
Albumin: 3.4 g/dL — ABNORMAL LOW (ref 3.5–5.0)
Alkaline Phosphatase: 58 U/L (ref 38–126)
Anion gap: 10 (ref 5–15)
BUN: 8 mg/dL (ref 8–23)
CO2: 22 mmol/L (ref 22–32)
Calcium: 9 mg/dL (ref 8.9–10.3)
Chloride: 107 mmol/L (ref 98–111)
Creatinine, Ser: 0.78 mg/dL (ref 0.61–1.24)
GFR, Estimated: >60 mL/min (ref >60)
Glucose, Bld: 87 mg/dL (ref 70–99)
Potassium: 3.7 mmol/L (ref 3.5–5.1)
Sodium: 140 mmol/L (ref 135–145)
Total Bilirubin: 1.9 mg/dL — ABNORMAL HIGH (ref 0.0–1.2)
Total Protein: 5.6 g/dL — ABNORMAL LOW (ref 6.5–8.1)

## 2024-03-24 LAB — CBC
HCT: 38.9 % — ABNORMAL LOW (ref 39.0–52.0)
Hemoglobin: 12.9 g/dL — ABNORMAL LOW (ref 13.0–17.0)
MCH: 32.7 pg (ref 26.0–34.0)
MCHC: 33.2 g/dL (ref 30.0–36.0)
MCV: 98.5 fL (ref 80.0–100.0)
Platelets: 182 K/uL (ref 150–400)
RBC: 3.95 MIL/uL — ABNORMAL LOW (ref 4.22–5.81)
RDW: 14 % (ref 11.5–15.5)
WBC: 6.7 K/uL (ref 4.0–10.5)
nRBC: 0 % (ref 0.0–0.2)

## 2024-03-24 MED ORDER — METOPROLOL SUCCINATE ER 25 MG PO TB24: 25.0000 mg | ORAL_TABLET | Freq: Every day | ORAL | Status: DC

## 2024-03-24 MED ORDER — DIATRIZOATE MEGLUMINE & SODIUM 66-10 % PO SOLN
90.0000 mL | Freq: Once | ORAL | Status: AC
  Administered 2024-03-24: 90 mL via ORAL
  Filled 2024-03-24: qty 90

## 2024-03-24 NOTE — Progress Notes (Signed)
  Subjective No acute events. Feeling reasonably well. Some flatus, no BM. No abdominal pain. No n/v.  Objective: Vital signs in last 24 hours: Temp:  [97.4 F (36.3 C)-98.8 F (37.1 C)] 97.8 F (36.6 C) (09/07 0614) Pulse Rate:  [55-93] 76 (09/07 0614) Resp:  [12-20] 16 (09/07 0614) BP: (108-201)/(68-124) 119/75 (09/07 0614) SpO2:  [94 %-100 %] 97 % (09/07 0614) Last BM Date : 03/21/24  Intake/Output from previous day: 09/06 0701 - 09/07 0700 In: 1044.8 [I.V.:1044.8] Out: 150 [Urine:150] Intake/Output this shift: No intake/output data recorded.  Gen: NAD, comfortable CV: RRR Pulm: Normal work of breathing Abd: Soft, not significantly tender nor distended.  Ext: SCDs in place  Lab Results: CBC  Recent Labs    03/23/24 1400 03/24/24 0453  WBC 10.2 6.7  HGB 15.0 12.9*  HCT 43.7 38.9*  PLT 224 182   BMET Recent Labs    03/23/24 1400 03/24/24 0453  NA 140 140  K 4.3 3.7  CL 102 107  CO2 22 22  GLUCOSE 114* 87  BUN 8 8  CREATININE 0.76 0.78  CALCIUM  9.9 9.0   PT/INR No results for input(s): LABPROT, INR in the last 72 hours. ABG No results for input(s): PHART, HCO3 in the last 72 hours.  Invalid input(s): PCO2, PO2  Studies/Results:  Anti-infectives: Anti-infectives (From admission, onward)    None        Assessment/Plan: Patient Active Problem List   Diagnosis Date Noted   Partial small bowel obstruction (HCC) 03/23/2024   Elevated PSA 03/23/2024   Thoracic aortic aneurysm (HCC) 05/02/2023   Erectile dysfunction 12/22/2022   Low back pain 12/22/2022   Onychomycosis 12/22/2022   Pain in left knee 12/22/2022   Other fatigue 08/23/2022   BMI 38.0-38.9,adult 08/23/2022   Obesity 08/23/2022   Prediabetes 07/26/2022   Vitamin D  deficiency 07/26/2022   Hyperlipidemia 06/24/2020   Paroxysmal atrial fibrillation (HCC)    Hx of cardiomegaly 07/17/2017   Obesity (BMI 30.0-34.9) 07/17/2017   Sleep apnea 07/17/2017   Pain in right  hip 07/28/2016   Atrial fibrillation, persistent (HCC) 03/11/2015   Renal colic on left side 01/27/2015   Left ureteral stone 01/27/2015   Ureteral stone 12/30/2013   Renal calculus 04/23/2012  SBO, appears to be resolving  - Ambulate - PO contrast SBO protocol - no NG as no n/v now - Having some flatus, suspect clinically resolving - Ok for clear liquids today - PPX: SCDs, lovenox      LOS: 0 days   I spent a total of 35 minutes in both face-to-face and non-face-to-face activities, excluding procedures performed, for this visit on the date of this encounter.   Lonni Pizza, MD [facility], A [facility]

## 2024-03-24 NOTE — Progress Notes (Signed)
  Progress Note   Patient: Phillip Shannon FMW:[id number] DOB: 1951-06-15 DOA: 03/23/2024     0 DOS: the patient was seen and examined on 03/24/2024   Brief hospital course: 73 y.o. male with medical history significant of osteoarthritis, atrial fibrillation, chronic back pain, nephrolithiasis, persistent atrial fibrillation, sleep apnea, obesity who presented to the emergency department last night due to abdominal pain, was discharged home after benign workup and treatment, but returned due to increased intensity of abdominal pain associated with severe dry heaving and diaphoresis.   No diarrhea, constipation, melena or hematochezia.  No flank pain, dysuria, frequency or hematuria. He denied fever, chills, rhinorrhea, sore throat, wheezing or hemoptysis.  No chest pain, palpitations, diaphoresis, PND, orthopnea or pitting edema of the lower extremities. No polyuria, polydipsia, polyphagia or blurred vision.   Assessment and Plan: Principal Problem:   Partial small bowel obstruction (HCC) Pantoprazole  40 mg IVP every 24 hours. General Surgery following Sx improving with BM. Tolerating clears now   Active Problems:   Hyperbilirubinemia Bili trending down     Atrial fibrillation, persistent (HCC) Not on anticoagulation. Cont metoprolol  for rate control     Sleep apnea No CPAP due to SBO.     Hyperlipidemia Held atorvastatin  for now.     Prediabetes Monitor blood glucose.     Obesity Awaiting current weight. Will calculate BMI afterwards.     Low back pain Analgesics as needed.     Thoracic aortic aneurysm [facility]) Outpatient imaging and vascular surgery follow-up.       Subjective: Feeling better. Had BM this afternoon,   Physical Exam: Vitals:   03/24/24 0202 03/24/24 0614 03/24/24 0911 03/24/24 1400  BP: 108/79 119/75 114/86 116/78  Pulse: 78 76 (!) 51 (!) 55  Resp: 16 16 16    Temp: (!) 97.4 F (36.3 C) 97.8 F (36.6 C) 98.2 F (36.8 C) 98.5 F (36.9 C)  TempSrc:  Oral Oral Oral Oral  SpO2: 98% 97% 96% 98%   General exam: Awake, laying in bed, in nad Respiratory system: Normal respiratory effort, no wheezing Cardiovascular system: regular rate, s1, s2 Gastrointestinal system: Soft, nondistended, positive BS Central nervous system: CN2-12 grossly intact, strength intact Extremities: Perfused, no clubbing Skin: Normal skin turgor, no notable skin lesions seen Psychiatry: Mood normal // no visual hallucinations   Data Reviewed:  Labs reviewed: Na 140, K 3.7, Cr 0.78, WBC 6.7, Hgb 12.9, Plts 182  Family Communication: Pt in room, family at bedside  Disposition: Status is: Observation The patient remains OBS appropriate and will d/c before 2 midnights.  Planned Discharge Destination: Home    Author: Garnette Pelt, MD 03/24/2024 3:28 PM  For on call review [email or web address].

## 2024-03-24 NOTE — Plan of Care (Signed)
   Problem: Education: Goal: Knowledge of General Education information will improve Description Including pain rating scale, medication(s)/side effects and non-pharmacologic comfort measures Outcome: Progressing

## 2024-03-24 NOTE — Hospital Course (Signed)
 73 y.o. male with medical history significant of osteoarthritis, atrial fibrillation, chronic back pain, nephrolithiasis, persistent atrial fibrillation, sleep apnea, obesity who presented to the emergency department last night due to abdominal pain, was discharged home after benign workup and treatment, but returned due to increased intensity of abdominal pain associated with severe dry heaving and diaphoresis.   No diarrhea, constipation, melena or hematochezia.  No flank pain, dysuria, frequency or hematuria. He denied fever, chills, rhinorrhea, sore throat, wheezing or hemoptysis.  No chest pain, palpitations, diaphoresis, PND, orthopnea or pitting edema of the lower extremities. No polyuria, polydipsia, polyphagia or blurred vision.

## 2024-03-24 NOTE — Progress Notes (Signed)
   03/24/24 1153  TOC Brief Assessment  Insurance and Status Reviewed  Patient has primary care physician Yes (HAMMER, ELI)  Home environment has been reviewed From home with spouse  Prior level of function: Independent  Prior/Current Home Services No current home services  Social Drivers of Health Review SDOH reviewed no interventions necessary  Readmission risk has been reviewed Yes  Transition of care needs no transition of care needs at this time     Inpatient Care Management has reviewed patient and no TOC needs have been identified at this time. We will continue to monitor patient advancement through interdisciplinary progression rounds. If new patient transition needs arise, please place a TOC consult.

## 2024-03-24 NOTE — Care Management Obs Status (Signed)
 MEDICARE OBSERVATION STATUS NOTIFICATION   Patient Details  Name: Phillip Shannon MRN: [id number] Date of Birth: 1951-03-07   Medicare Observation Status Notification Given:  Yes    Adon Gehlhausen LILLETTE Fenton, LCSW 03/24/2024, 9:33 AM

## 2024-03-25 DIAGNOSIS — K5669 Other partial intestinal obstruction: Secondary | ICD-10-CM | POA: Diagnosis not present

## 2024-03-25 DIAGNOSIS — R1084 Generalized abdominal pain: Secondary | ICD-10-CM | POA: Diagnosis not present

## 2024-03-25 DIAGNOSIS — K56609 Unspecified intestinal obstruction, unspecified as to partial versus complete obstruction: Secondary | ICD-10-CM | POA: Diagnosis not present

## 2024-03-25 DIAGNOSIS — K56600 Partial intestinal obstruction, unspecified as to cause: Secondary | ICD-10-CM | POA: Diagnosis not present

## 2024-03-25 DIAGNOSIS — R112 Nausea with vomiting, unspecified: Secondary | ICD-10-CM | POA: Diagnosis not present

## 2024-03-25 NOTE — Plan of Care (Signed)
   Problem: Nutrition: Goal: Adequate nutrition will be maintained Outcome: Progressing

## 2024-03-25 NOTE — Progress Notes (Addendum)
 P                     Pt is sleeping cal and cooperative

## 2024-03-25 NOTE — Progress Notes (Signed)
 Subjective: CC: Patient reports he is tolerating cld without abdominal pain, distension/bloating, or n/v. He is passing flatus and had several loose bm's overnight.   Patient denies prior abdominal surgeries, abdominopelvic radiation, hernia, or prior abdominopelvic infectious/inflammatory disorders tx non-op. He reports his last colonoscopy in 2024 that was normal.    Objective: Vital signs in last 24 hours: Temp:  [97.7 F (36.5 C)-98.5 F (36.9 C)] 98.3 F (36.8 C) (09/08 0437) Pulse Rate:  [55-77] 77 (09/08 0437) Resp:  [14-16] 14 (09/08 0437) BP: (116-137)/(78-93) 137/78 (09/08 0437) SpO2:  [93 %-98 %] 93 % (09/08 0437) Weight:  [888 kg] 111 kg (09/08 0509) Last BM Date : 03/24/24  Intake/Output from previous day: 09/07 0701 - 09/08 0700 In: 720 [P.O.:720] Out: 400 [Urine:400] Intake/Output this shift: No intake/output data recorded.  PE: Gen:  Alert, NAD, pleasant Abd: Soft, ND, NT, no obvious hernia   Lab Results:  Recent Labs    03/23/24 1400 03/24/24 0453  WBC 10.2 6.7  HGB 15.0 12.9*  HCT 43.7 38.9*  PLT 224 182   BMET Recent Labs    03/23/24 1400 03/24/24 0453  NA 140 140  K 4.3 3.7  CL 102 107  CO2 22 22  GLUCOSE 114* 87  BUN 8 8  CREATININE 0.76 0.78  CALCIUM  9.9 9.0   PT/INR No results for input(s): LABPROT, INR in the last 72 hours. CMP     Component Value Date/Time   NA 140 03/24/2024 0453   NA 139 03/08/2022 1159   K 3.7 03/24/2024 0453   CL 107 03/24/2024 0453   CO2 22 03/24/2024 0453   GLUCOSE 87 03/24/2024 0453   BUN 8 03/24/2024 0453   BUN 11 03/08/2022 1159   CREATININE 0.78 03/24/2024 0453   CALCIUM  9.0 03/24/2024 0453   PROT 5.6 (L) 03/24/2024 0453   PROT 6.9 09/16/2020 1147   ALBUMIN 3.4 (L) 03/24/2024 0453   ALBUMIN 4.1 09/16/2020 1147   AST 16 03/24/2024 0453   ALT 12 03/24/2024 0453   ALKPHOS 58 03/24/2024 0453   BILITOT 1.9 (H) 03/24/2024 0453   BILITOT 1.4 (H) 09/16/2020 1147   GFRNONAA >60  03/24/2024 0453   GFRAA >60 01/27/2015 0722   Lipase     Component Value Date/Time   LIPASE 21 03/23/2024 1400    Studies/Results: DG Abd Portable 1V-Small Bowel Obstruction Protocol-initial, 8 hr delay Result Date: 03/24/2024 CLINICAL DATA:  8 hour delay after oral contrast. EXAM: PORTABLE ABDOMEN - 1 VIEW COMPARISON:  03/23/2024 FINDINGS: A mildly distended loop of small bowel is noted in the mid left abdomen measuring up to 3.9 cm. Contrast is identified in the colon and rectum. No radio-opaque calculi or other acute radiographic abnormality are seen. IMPRESSION: Mildly distended loop of small bowel in the mid left abdomen without evidence of obstruction, suggesting ileus. Electronically Signed   By: Leita Birmingham M.D.   On: 03/24/2024 17:24   CT Angio Chest/Abd/Pel for Dissection W and/or Wo Contrast Result Date: 03/23/2024 CLINICAL DATA:  Acute aortic syndrome suspected. Severe abdominal pain with nausea vomiting. Hypertension. Clinical concern for dissection versus ischemic bowel. EXAM: CT ANGIOGRAPHY CHEST, ABDOMEN AND PELVIS TECHNIQUE: Non-contrast CT of the chest was initially obtained. Multidetector CT imaging through the chest, abdomen and pelvis was performed using the standard protocol during bolus administration of intravenous contrast. Multiplanar reconstructed images and MIPs were obtained and reviewed to evaluate the vascular anatomy. RADIATION DOSE REDUCTION: This exam was performed according  to the departmental dose-optimization program which includes automated exposure control, adjustment of the mA and/or kV according to patient size and/or use of iterative reconstruction technique. CONTRAST:  OMNIPAQUE  IOHEXOL  350 MG/ML SOLN COMPARISON:  01/27/2015. FINDINGS: CTA CHEST FINDINGS Cardiovascular: The heart is borderline enlarged a small pericardial effusion is noted. There is aneurysmal dilatation of the ascending aorta measuring 4.4 cm. No dissection is seen. The pulmonary trunk  is distended suggesting underlying pulmonary artery hypertension. Mediastinum/Nodes: No mediastinal, hilar, or axillary lymphadenopathy is seen. The thyroid  gland, trachea, and esophagus are within normal limits. There is a small hiatal hernia. Lungs/Pleura: Atelectasis is present bilaterally. No effusion or pneumothorax is seen. No significant pulmonary nodule or mass is seen. Musculoskeletal: Degenerative changes are present in the thoracic spine. No acute osseous abnormality is seen. Review of the MIP images confirms the above findings. CTA ABDOMEN AND PELVIS FINDINGS VASCULAR Aorta: Normal caliber aorta without aneurysm, dissection, vasculitis or significant stenosis. Aortic atherosclerosis. Celiac: Patent without evidence of dissection, vasculitis or significant stenosis. There is mild enlargement of the proximal celiac artery with no evidence stenosis. SMA: Patent without evidence of aneurysm, dissection, vasculitis or significant stenosis. Renals: Both renal arteries are patent without evidence of aneurysm, dissection, vasculitis, fibromuscular dysplasia or significant stenosis. Accessory renal arteries are noted bilaterally. IMA: Patent without evidence of aneurysm, dissection, vasculitis or significant stenosis. Inflow: Patent without evidence of aneurysm, dissection, vasculitis or significant stenosis. Veins: No obvious venous abnormality within the limitations of this arterial phase study. Review of the MIP images confirms the above findings. NON-VASCULAR Hepatobiliary: No focal liver abnormality is seen. No gallstones, gallbladder wall thickening, or biliary dilatation. Pancreas: Unremarkable. No pancreatic ductal dilatation or surrounding inflammatory changes. Spleen: Normal in size without focal abnormality. Adrenals/Urinary Tract: There is a 1.7 cm nodule in the right adrenal gland with attenuation of 0 Hounsfield units on noncontrast exam, compatible with adenoma. No adnexal mass on the left. Cysts  are present within the kidneys bilaterally. The kidneys enhance symmetrically. Nonobstructive renal calculi are noted on the left. There is no hydronephrosis bilaterally. The bladder is unremarkable. Stomach/Bowel: There is a small hiatal hernia. The stomach is otherwise within normal limits. A few prominent fluid-filled loops of bowel are noted in the abdomen measuring up to 3.5 cm. There is a suspected transition point in the mid left abdomen, best seen on coronal image 9 and sagittal image 190. No pneumatosis or free air is seen. Appendix appears normal. Lymphatic: No abdominal or pelvic lymphadenopathy is seen. Reproductive: The prostate gland is enlarged. Other: There is a trace amount of free fluid in the pelvis. A fat containing umbilical hernia is present. 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 No acute osseous abnormality is seen. Review of the MIP images confirms the above findings. IMPRESSION: 1. Aortic atherosclerosis without evidence of dissection. 2. Dilated loops of small bowel in the abdomen with a suspected trace shin point in the left lower quadrant, concerning for partial or early small bowel obstruction. 3. 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 Recommend annual imaging followup by CTA or MRA. This recommendation follows 2010 ACCF/AHA/AATS/ACR/ASA/SCA/SCAI/SIR/STS/SVM Guidelines for the Diagnosis and Management of Patients with Thoracic Aortic Disease. Circulation. 2010; 121: Z733-z630. Aortic aneurysm NOS (ICD10-I71.9) 4. Distended pulmonary trunk suggesting underlying pulmonary artery hypertension. 5. Small hiatal hernia. 6. Nonobstructive left renal calculus. 7. Enlarged prostate gland. Critical Value/emergent results were called by telephone at the  time of interpretation on 03/23/2024 at 3:13 pm to provider [facility] , who verbally acknowledged these results.  Electronically Signed   By: Leita Birmingham M.D.   On: 03/23/2024 15:13    Anti-infectives: Anti-infectives (From admission, onward)    None        Assessment/Plan SBO - Clinically and radiographically appears to be resolving. Xray w/ contrast in colon on 9/7. Patient with resolution of symptoms and now having bowel function - ADAT if tolerates diet advancement, okay for discharge from our standpoint. Will send message to [facility].   FEN - Soft diet, IVF per [facility] VTE - SCDs, Lovenox . Okay for therapeutic anticoagulation from our standpoint if needed ID - None   - Per [facility] -  A. Fib.  OSA HLD AAA  I reviewed nursing notes, hospitalist notes, last 24 h vitals and pain scores, last 48 h intake and output, last 24 h labs and trends, and last 24 h imaging results.    LOS: 0 days    Ozell CHRISTELLA Shaper, [facility] 03/25/2024, 9:18 AM Please see Amion for pager number during day hours 7:00am-4:30pm

## 2024-03-25 NOTE — Discharge Summary (Signed)
 Physician Discharge Summary   Patient: Phillip Shannon MRN: [id number] DOB: Jul 26, 1950  Admit date:     03/23/2024  Discharge date: 03/25/24  Discharge Physician: Garnette Pelt   PCP: Leonel Cole, MD   Recommendations at discharge:    Follow up with PCP in 1-2 weeks  Discharge Diagnoses: Principal Problem:   Partial small bowel obstruction (HCC) Active Problems:   Atrial fibrillation, persistent (HCC)   Sleep apnea   Hyperlipidemia   Prediabetes   Obesity   Low back pain   Thoracic aortic aneurysm (HCC)  Resolved Problems:   * No resolved hospital problems. *  Hospital Course: 73 y.o. male with medical history significant of osteoarthritis, atrial fibrillation, chronic back pain, nephrolithiasis, persistent atrial fibrillation, sleep apnea, obesity who presented to the emergency department last night due to abdominal pain, was discharged home after benign workup and treatment, but returned due to increased intensity of abdominal pain associated with severe dry heaving and diaphoresis.   No diarrhea, constipation, melena or hematochezia.  No flank pain, dysuria, frequency or hematuria. He denied fever, chills, rhinorrhea, sore throat, wheezing or hemoptysis.  No chest pain, palpitations, diaphoresis, PND, orthopnea or pitting edema of the lower extremities. No polyuria, polydipsia, polyphagia or blurred vision.   Assessment and Plan: Principal Problem:   Partial small bowel obstruction ruled out, ileus ruled in Pantoprazole  40 mg IVP every 24 hours. General Surgery following CT with finding c/w ileus. Symptoms resolved and diet was successfully advanced   Active Problems:   Hyperbilirubinemia Bili trending down     Atrial fibrillation, persistent (HCC) Not on anticoagulation. Cont metoprolol  for rate control     Sleep apnea     Hyperlipidemia Held atorvastatin , resume on d/c     Prediabetes     Obesity     Low back pain Analgesics as needed.     Thoracic aortic  aneurysm [facility]) Outpatient imaging and vascular surgery follow-up.       Consultants: General Surgery Procedures performed:   Disposition: Home Diet recommendation:  Regular diet DISCHARGE MEDICATION: Allergies as of 03/25/2024       Reactions   Hornet Venom Anaphylaxis, Hives, Swelling   Swelling all over    Codeine Nausea And Vomiting        Medication List     STOP taking these medications    diclofenac  75 MG EC tablet Commonly known as: VOLTAREN    doxycycline  100 MG capsule Commonly known as: VIBRAMYCIN    HYDROcodone -acetaminophen  5-325 MG tablet Commonly known as: NORCO/VICODIN   Ozempic  (0.25 or 0.5 MG/DOSE) 2 MG/3ML Sopn Generic drug: Semaglutide (0.25 or 0.5MG /DOS)   tadalafil  20 MG tablet Commonly known as: CIALIS    traMADol  50 MG tablet Commonly known as: ULTRAM    Zepbound  2.5 MG/0.5ML Pen Generic drug: tirzepatide        TAKE these medications    aspirin  EC 325 MG tablet Take 325 mg by mouth daily.   atorvastatin  40 MG tablet Commonly known as: LIPITOR Take 1 tablet (40 mg total) by mouth daily.   gabapentin  300 MG capsule Commonly known as: Neurontin  Take 1 capsule (300 mg total) by mouth 3 (three) times daily.   metFORMIN  500 MG tablet Commonly known as: GLUCOPHAGE  Take 1 tablet (500 mg total) by mouth 2 (two) times daily with a meal.   metoprolol  succinate 25 MG 24 hr tablet Commonly known as: TOPROL -XL Take 1 tablet (25 mg total) by mouth at bedtime.   ondansetron  4 MG disintegrating tablet Commonly known as: ZOFRAN -ODT Take  1 tablet (4 mg total) by mouth every 8 (eight) hours as needed for up to 3 days for nausea or vomiting.   tamsulosin  0.4 MG Caps capsule Commonly known as: FLOMAX  Take 1 capsule (0.4 mg total) by mouth daily for urinary frequency/nighttime urination   Vitamin D  (Ergocalciferol ) 1.25 MG (50000 UNIT) Caps capsule Commonly known as: DRISDOL  Take 1 capsule (50,000 Units total) by mouth every 7 (seven) days.         Follow-up Information     Leonel Cole, MD Follow up in 2 week(s).   Specialty: Family Medicine Why: Hospital follow up Contact information: [address] [place] [address] [phone]                Discharge Exam: [name]   03/25/24 0509  Weight: 111 kg   General exam: Awake, laying in bed, in nad Respiratory system: Normal respiratory effort, no wheezing Cardiovascular system: regular rate, s1, s2 Gastrointestinal system: Soft, nondistended, positive BS Central nervous system: CN2-12 grossly intact, strength intact Extremities: Perfused, no clubbing Skin: Normal skin turgor, no notable skin lesions seen Psychiatry: Mood normal // no visual hallucinations   Condition at discharge: fair  The results of significant diagnostics from this hospitalization (including imaging, microbiology, ancillary and laboratory) are listed below for reference.   Imaging Studies: DG Abd Portable 1V-Small Bowel Obstruction Protocol-initial, 8 hr delay Result Date: 03/24/2024 CLINICAL DATA:  8 hour delay after oral contrast. EXAM: PORTABLE ABDOMEN - 1 VIEW COMPARISON:  03/23/2024 FINDINGS: A mildly distended loop of small bowel is noted in the mid left abdomen measuring up to 3.9 cm. Contrast is identified in the colon and rectum. No radio-opaque calculi or other acute radiographic abnormality are seen. IMPRESSION: Mildly distended loop of small bowel in the mid left abdomen without evidence of obstruction, suggesting ileus. Electronically Signed   By: Leita Birmingham M.D.   On: 03/24/2024 17:24   CT Angio Chest/Abd/Pel for Dissection W and/or Wo Contrast Result Date: 03/23/2024 CLINICAL DATA:  Acute aortic syndrome suspected. Severe abdominal pain with nausea vomiting. Hypertension. Clinical concern for dissection versus ischemic bowel. EXAM: CT ANGIOGRAPHY CHEST, ABDOMEN AND PELVIS TECHNIQUE: Non-contrast CT of the chest was initially obtained. Multidetector CT  imaging through the chest, abdomen and pelvis was performed using the standard protocol during bolus administration of intravenous contrast. Multiplanar reconstructed images and MIPs were obtained and reviewed to evaluate the vascular anatomy. RADIATION DOSE REDUCTION: This exam was performed according to the departmental dose-optimization program which includes automated exposure control, adjustment of the mA and/or kV according to patient size and/or use of iterative reconstruction technique. CONTRAST:  OMNIPAQUE  IOHEXOL  350 MG/ML SOLN COMPARISON:  01/27/2015. FINDINGS: CTA CHEST FINDINGS Cardiovascular: The heart is borderline enlarged a small pericardial effusion is noted. There is aneurysmal dilatation of the ascending aorta measuring 4.4 cm. No dissection is seen. The pulmonary trunk is distended suggesting underlying pulmonary artery hypertension. Mediastinum/Nodes: No mediastinal, hilar, or axillary lymphadenopathy is seen. The thyroid  gland, trachea, and esophagus are within normal limits. There is a small hiatal hernia. Lungs/Pleura: Atelectasis is present bilaterally. No effusion or pneumothorax is seen. No significant pulmonary nodule or mass is seen. Musculoskeletal: Degenerative changes are present in the thoracic spine. No acute osseous abnormality is seen. Review of the MIP images confirms the above findings. CTA ABDOMEN AND PELVIS FINDINGS VASCULAR Aorta: Normal caliber aorta without aneurysm, dissection, vasculitis or significant stenosis. Aortic atherosclerosis. Celiac: Patent without evidence of dissection, vasculitis or significant  stenosis. There is mild enlargement of the proximal celiac artery with no evidence stenosis. SMA: Patent without evidence of aneurysm, dissection, vasculitis or significant stenosis. Renals: Both renal arteries are patent without evidence of aneurysm, dissection, vasculitis, fibromuscular dysplasia or significant stenosis. Accessory renal arteries are noted  bilaterally. IMA: Patent without evidence of aneurysm, dissection, vasculitis or significant stenosis. Inflow: Patent without evidence of aneurysm, dissection, vasculitis or significant stenosis. Veins: No obvious venous abnormality within the limitations of this arterial phase study. Review of the MIP images confirms the above findings. NON-VASCULAR Hepatobiliary: No focal liver abnormality is seen. No gallstones, gallbladder wall thickening, or biliary dilatation. Pancreas: Unremarkable. No pancreatic ductal dilatation or surrounding inflammatory changes. Spleen: Normal in size without focal abnormality. Adrenals/Urinary Tract: There is a 1.7 cm nodule in the right adrenal gland with attenuation of 0 Hounsfield units on noncontrast exam, compatible with adenoma. No adnexal mass on the left. Cysts are present within the kidneys bilaterally. The kidneys enhance symmetrically. Nonobstructive renal calculi are noted on the left. There is no hydronephrosis bilaterally. The bladder is unremarkable. Stomach/Bowel: There is a small hiatal hernia. The stomach is otherwise within normal limits. A few prominent fluid-filled loops of bowel are noted in the abdomen measuring up to 3.5 cm. There is a suspected transition point in the mid left abdomen, best seen on coronal image 9 and sagittal image 190. No pneumatosis or free air is seen. Appendix appears normal. Lymphatic: No abdominal or pelvic lymphadenopathy is seen. Reproductive: The prostate gland is enlarged. Other: There is a trace amount of free fluid in the pelvis. A fat containing umbilical hernia is present. 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 No acute osseous abnormality is seen. Review of the MIP images confirms the above findings. IMPRESSION: 1. Aortic atherosclerosis without evidence of dissection. 2. Dilated loops of small bowel in the abdomen with a suspected  trace shin point in the left lower quadrant, concerning for partial or early small bowel obstruction. 3. 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 Recommend annual imaging followup by CTA or MRA. This recommendation follows 2010 ACCF/AHA/AATS/ACR/ASA/SCA/SCAI/SIR/STS/SVM Guidelines for the Diagnosis and Management of Patients with Thoracic Aortic Disease. Circulation. 2010; 121: Z733-z630. Aortic aneurysm NOS (ICD10-I71.9) 4. Distended pulmonary trunk suggesting underlying pulmonary artery hypertension. 5. Small hiatal hernia. 6. Nonobstructive left renal calculus. 7. Enlarged prostate gland. Critical Value/emergent results were called by telephone at the time of interpretation on 03/23/2024 at 3:13 pm to provider [facility] , who verbally acknowledged these results. Electronically Signed   By: Leita Birmingham M.D.   On: 03/23/2024 15:13   US  Abdomen Limited RUQ (LIVER/GB) Result Date: 03/23/2024 EXAM: Right Upper Quadrant Abdominal Ultrasound TECHNIQUE: Real-time ultrasonography of the right upper quadrant of the abdomen was performed. COMPARISON: Comparison with CT 01/27/2015. CLINICAL HISTORY: RUQ abdominal pain. FINDINGS: LIVER: The liver demonstrates normal echogenicity. No intrahepatic biliary ductal dilatation. No mass. BILIARY SYSTEM: No evidence of pericholecystic fluid or wall thickening. No cholelithiasis. Negative sonographic Murphy's sign. Common bile duct is within normal limits measuring 5 mm. RIGHT KIDNEY: The right kidney contains a 6 cm renal cyst without internal vascularity. No follow-up recommended. OTHER: No right upper quadrant ascites. IMPRESSION: 1. No evidence of cholelithiasis or other acute abnormality in the right upper quadrant. Electronically signed by: Norman Gatlin MD 03/23/2024 02:46 AM EDT RP Workstation: [id number]    Microbiology: Results for orders placed or performed in visit  on 02/04/19  Novel Coronavirus, NAA ([facility])     Status: None    Collection Time: 02/04/19 12:00 AM  Result Value Ref Range Status   SARS-CoV-2, NAA Not Detected Not Detected Final    Comment: This test was developed and its performance characteristics determined by [employer]. This test has not been FDA cleared or approved. This test has been authorized by FDA under an Emergency Use Authorization (EUA). This test is only authorized for the duration of time the declaration that circumstances exist justifying the authorization of the emergency use of in vitro diagnostic tests for detection of SARS-CoV-2 virus and/or diagnosis of COVID-19 infection under section 564(b)(1) of the Act, 21 U.S.C. 639aaa-6(a)(8), unless the authorization is terminated or revoked sooner. When diagnostic testing is negative, the possibility of a false negative result should be considered in the context of a patient's recent exposures and the presence of clinical signs and symptoms consistent with COVID-19. An individual without symptoms of COVID-19 and who is not shedding SARS-CoV-2 virus would expect to have a negative (not detected) result in this assay.     Labs: CBC: Recent Labs  Lab 03/22/24 2350 03/23/24 1400 03/24/24 0453  WBC 7.5 10.2 6.7  HGB 14.2 15.0 12.9*  HCT 41.6 43.7 38.9*  MCV 95.6 94.6 98.5  PLT 211 224 182   Basic Metabolic Panel: Recent Labs  Lab 03/22/24 2350 03/23/24 1400 03/24/24 0453  NA 141 140 140  K 4.2 4.3 3.7  CL 104 102 107  CO2 25 22 22   GLUCOSE 104* 114* 87  BUN 10 8 8   CREATININE 0.85 0.76 0.78  CALCIUM  10.0 9.9 9.0  MG  --  2.0  --   PHOS  --  2.9  --    Liver Function Tests: Recent Labs  Lab 03/22/24 2350 03/23/24 1400 03/24/24 0453  AST 21 22 16   ALT 19 19 12   ALKPHOS 71 77 58  BILITOT 1.5* 2.1* 1.9*  PROT 6.8 7.1 5.6*  ALBUMIN 4.1 4.2 3.4*   CBG: No results for input(s): GLUCAP in the last 168 hours.  Discharge time spent: less than 30 minutes.  Signed: Garnette Pelt, MD [facility] 03/25/2024

## 2024-03-30 DIAGNOSIS — Z23 Encounter for immunization: Secondary | ICD-10-CM | POA: Diagnosis not present

## 2024-04-03 ENCOUNTER — Other Ambulatory Visit (HOSPITAL_COMMUNITY): Payer: Self-pay

## 2024-04-08 ENCOUNTER — Other Ambulatory Visit (HOSPITAL_COMMUNITY): Payer: Self-pay

## 2024-04-08 DIAGNOSIS — M47816 Spondylosis without myelopathy or radiculopathy, lumbar region: Secondary | ICD-10-CM | POA: Diagnosis not present

## 2024-04-08 DIAGNOSIS — K56600 Partial intestinal obstruction, unspecified as to cause: Secondary | ICD-10-CM | POA: Diagnosis not present

## 2024-04-08 DIAGNOSIS — K567 Ileus, unspecified: Secondary | ICD-10-CM | POA: Diagnosis not present

## 2024-04-08 MED ORDER — MELOXICAM 15 MG PO TABS
15.0000 mg | ORAL_TABLET | Freq: Every day | ORAL | 3 refills | Status: AC
  Filled 2024-04-08: qty 60, 60d supply, fill #0
  Filled 2024-07-08: qty 60, 60d supply, fill #1

## 2024-05-02 ENCOUNTER — Other Ambulatory Visit (HOSPITAL_COMMUNITY): Payer: Self-pay

## 2024-05-02 MED ORDER — TAMSULOSIN HCL 0.4 MG PO CAPS
0.4000 mg | ORAL_CAPSULE | Freq: Every day | ORAL | 0 refills | Status: AC
  Filled 2024-05-02: qty 90, 90d supply, fill #0

## 2024-08-05 ENCOUNTER — Other Ambulatory Visit (HOSPITAL_COMMUNITY): Payer: Self-pay

## 2024-08-06 ENCOUNTER — Other Ambulatory Visit: Payer: Self-pay

## 2024-08-06 ENCOUNTER — Other Ambulatory Visit (HOSPITAL_COMMUNITY): Payer: Self-pay

## 2024-08-06 MED ORDER — TIZANIDINE HCL 4 MG PO TABS
4.0000 mg | ORAL_TABLET | Freq: Two times a day (BID) | ORAL | 2 refills | Status: AC
  Filled 2024-08-06: qty 90, 45d supply, fill #0

## 2024-08-20 ENCOUNTER — Other Ambulatory Visit (HOSPITAL_COMMUNITY): Payer: Self-pay

## 2024-08-20 ENCOUNTER — Other Ambulatory Visit: Payer: Self-pay

## 2024-08-20 MED ORDER — HYDROCODONE-ACETAMINOPHEN 5-325 MG PO TABS
1.0000 | ORAL_TABLET | Freq: Four times a day (QID) | ORAL | 0 refills | Status: AC | PRN
  Filled 2024-08-20: qty 56, 14d supply, fill #0

## 2024-08-28 ENCOUNTER — Ambulatory Visit: Admitting: Cardiovascular Disease
